# Patient Record
Sex: Female | Born: 1975
Health system: Southern US, Community
[De-identification: ages and names within clinical notes are randomized; demographics above are authoritative.]

## PROBLEM LIST (undated history)

## (undated) DIAGNOSIS — R1310 Dysphagia, unspecified: Secondary | ICD-10-CM

## (undated) DIAGNOSIS — M549 Dorsalgia, unspecified: Secondary | ICD-10-CM

## (undated) DIAGNOSIS — K760 Fatty (change of) liver, not elsewhere classified: Secondary | ICD-10-CM

## (undated) DIAGNOSIS — D8989 Other specified disorders involving the immune mechanism, not elsewhere classified: Secondary | ICD-10-CM

## (undated) DIAGNOSIS — E079 Disorder of thyroid, unspecified: Secondary | ICD-10-CM

## (undated) DIAGNOSIS — D509 Iron deficiency anemia, unspecified: Secondary | ICD-10-CM

## (undated) DIAGNOSIS — Q625 Duplication of ureter: Secondary | ICD-10-CM

## (undated) DIAGNOSIS — K219 Gastro-esophageal reflux disease without esophagitis: Secondary | ICD-10-CM

## (undated) DIAGNOSIS — M2550 Pain in unspecified joint: Secondary | ICD-10-CM

## (undated) DIAGNOSIS — D649 Anemia, unspecified: Secondary | ICD-10-CM

## (undated) HISTORY — DX: Other specified disorders involving the immune mechanism, not elsewhere classified: D89.89

## (undated) HISTORY — PX: FOOT FRACTURE SURGERY: SHX645

## (undated) HISTORY — DX: Iron deficiency anemia, unspecified: D50.9

## (undated) HISTORY — DX: Disorder of thyroid, unspecified: E07.9

## (undated) HISTORY — DX: Duplication of ureter: Q62.5

## (undated) HISTORY — DX: Fatty (change of) liver, not elsewhere classified: K76.0

## (undated) HISTORY — PX: EYE SURGERY: SHX253

## (undated) HISTORY — PX: BREAST SURGERY: SHX581

## (undated) HISTORY — PX: OTHER SURGICAL HISTORY: SHX169

## (undated) HISTORY — DX: Dysphagia, unspecified: R13.10

## (undated) HISTORY — DX: Pain in unspecified joint: M25.50

## (undated) HISTORY — PX: ESOPHAGEAL DILATION: SHX303

## (undated) HISTORY — DX: Gastro-esophageal reflux disease without esophagitis: K21.9

## (undated) HISTORY — DX: Dorsalgia, unspecified: M54.9

---

## 1998-09-14 ENCOUNTER — Ambulatory Visit: Admission: RE | Admit: 1998-09-14 | Discharge: 1998-09-14 | Payer: Self-pay | Admitting: Family Medicine

## 1999-08-16 ENCOUNTER — Encounter: Payer: Self-pay | Admitting: Surgery

## 1999-08-16 ENCOUNTER — Emergency Department (HOSPITAL_COMMUNITY): Admission: EM | Admit: 1999-08-16 | Discharge: 1999-08-16 | Payer: Self-pay

## 1999-08-16 ENCOUNTER — Encounter: Payer: Self-pay | Admitting: Emergency Medicine

## 1999-09-06 ENCOUNTER — Ambulatory Visit (HOSPITAL_COMMUNITY): Admission: RE | Admit: 1999-09-06 | Discharge: 1999-09-06 | Payer: Self-pay | Admitting: Orthopedic Surgery

## 1999-09-06 ENCOUNTER — Encounter: Payer: Self-pay | Admitting: Orthopedic Surgery

## 1999-09-15 ENCOUNTER — Ambulatory Visit (HOSPITAL_BASED_OUTPATIENT_CLINIC_OR_DEPARTMENT_OTHER): Admission: RE | Admit: 1999-09-15 | Discharge: 1999-09-15 | Payer: Self-pay | Admitting: Orthopedic Surgery

## 1999-11-10 ENCOUNTER — Ambulatory Visit: Admission: RE | Admit: 1999-11-10 | Discharge: 1999-11-10 | Payer: Self-pay | Admitting: Radiation Oncology

## 1999-11-28 ENCOUNTER — Other Ambulatory Visit: Admission: RE | Admit: 1999-11-28 | Discharge: 1999-11-28 | Payer: Self-pay | Admitting: Obstetrics & Gynecology

## 2000-12-10 ENCOUNTER — Ambulatory Visit (HOSPITAL_COMMUNITY): Admission: RE | Admit: 2000-12-10 | Discharge: 2000-12-10 | Payer: Self-pay | Admitting: Obstetrics & Gynecology

## 2000-12-17 ENCOUNTER — Other Ambulatory Visit: Admission: RE | Admit: 2000-12-17 | Discharge: 2000-12-17 | Payer: Self-pay | Admitting: Obstetrics & Gynecology

## 2001-02-03 ENCOUNTER — Ambulatory Visit: Admission: AD | Admit: 2001-02-03 | Discharge: 2001-02-03 | Payer: Self-pay | Admitting: Obstetrics & Gynecology

## 2001-02-03 ENCOUNTER — Encounter (INDEPENDENT_AMBULATORY_CARE_PROVIDER_SITE_OTHER): Payer: Self-pay | Admitting: Specialist

## 2001-02-03 ENCOUNTER — Encounter: Payer: Self-pay | Admitting: Obstetrics & Gynecology

## 2001-02-05 ENCOUNTER — Inpatient Hospital Stay (HOSPITAL_COMMUNITY): Admission: AD | Admit: 2001-02-05 | Discharge: 2001-02-05 | Payer: Self-pay | Admitting: Obstetrics and Gynecology

## 2001-04-02 ENCOUNTER — Ambulatory Visit (HOSPITAL_COMMUNITY): Admission: RE | Admit: 2001-04-02 | Discharge: 2001-04-02 | Payer: Self-pay | Admitting: *Deleted

## 2001-04-05 ENCOUNTER — Emergency Department (HOSPITAL_COMMUNITY): Admission: EM | Admit: 2001-04-05 | Discharge: 2001-04-05 | Payer: Self-pay | Admitting: Emergency Medicine

## 2001-12-31 HISTORY — PX: OTHER SURGICAL HISTORY: SHX169

## 2002-04-16 ENCOUNTER — Inpatient Hospital Stay (HOSPITAL_COMMUNITY): Admission: AD | Admit: 2002-04-16 | Discharge: 2002-04-16 | Payer: Self-pay | Admitting: Obstetrics & Gynecology

## 2002-04-21 ENCOUNTER — Inpatient Hospital Stay (HOSPITAL_COMMUNITY): Admission: AD | Admit: 2002-04-21 | Discharge: 2002-04-24 | Payer: Self-pay | Admitting: Obstetrics and Gynecology

## 2002-06-01 ENCOUNTER — Other Ambulatory Visit: Admission: RE | Admit: 2002-06-01 | Discharge: 2002-06-01 | Payer: Self-pay | Admitting: Obstetrics and Gynecology

## 2002-06-11 ENCOUNTER — Ambulatory Visit (HOSPITAL_COMMUNITY): Admission: RE | Admit: 2002-06-11 | Discharge: 2002-06-11 | Payer: Self-pay | Admitting: Obstetrics and Gynecology

## 2002-06-11 ENCOUNTER — Encounter (INDEPENDENT_AMBULATORY_CARE_PROVIDER_SITE_OTHER): Payer: Self-pay

## 2002-12-31 HISTORY — PX: AUGMENTATION MAMMAPLASTY: SUR837

## 2003-05-26 ENCOUNTER — Other Ambulatory Visit: Admission: RE | Admit: 2003-05-26 | Discharge: 2003-05-26 | Payer: Self-pay | Admitting: Obstetrics and Gynecology

## 2004-04-02 ENCOUNTER — Emergency Department (HOSPITAL_COMMUNITY): Admission: EM | Admit: 2004-04-02 | Discharge: 2004-04-02 | Payer: Self-pay | Admitting: Emergency Medicine

## 2004-07-27 ENCOUNTER — Other Ambulatory Visit: Admission: RE | Admit: 2004-07-27 | Discharge: 2004-07-27 | Payer: Self-pay | Admitting: Obstetrics and Gynecology

## 2004-09-27 ENCOUNTER — Ambulatory Visit: Admission: RE | Admit: 2004-09-27 | Discharge: 2004-09-27 | Payer: Self-pay | Admitting: Radiation Oncology

## 2005-02-22 ENCOUNTER — Ambulatory Visit (HOSPITAL_COMMUNITY): Admission: RE | Admit: 2005-02-22 | Discharge: 2005-02-22 | Payer: Self-pay | Admitting: Gastroenterology

## 2005-02-22 ENCOUNTER — Encounter (INDEPENDENT_AMBULATORY_CARE_PROVIDER_SITE_OTHER): Payer: Self-pay | Admitting: Specialist

## 2005-05-11 ENCOUNTER — Encounter (INDEPENDENT_AMBULATORY_CARE_PROVIDER_SITE_OTHER): Payer: Self-pay | Admitting: *Deleted

## 2005-05-11 ENCOUNTER — Ambulatory Visit (HOSPITAL_COMMUNITY): Admission: RE | Admit: 2005-05-11 | Discharge: 2005-05-11 | Payer: Self-pay | Admitting: Gastroenterology

## 2005-09-14 ENCOUNTER — Other Ambulatory Visit: Admission: RE | Admit: 2005-09-14 | Discharge: 2005-09-14 | Payer: Self-pay | Admitting: Obstetrics and Gynecology

## 2006-05-13 ENCOUNTER — Ambulatory Visit (HOSPITAL_COMMUNITY): Admission: RE | Admit: 2006-05-13 | Discharge: 2006-05-13 | Payer: Self-pay | Admitting: Orthopedic Surgery

## 2006-10-02 ENCOUNTER — Ambulatory Visit: Payer: Self-pay | Admitting: Hematology & Oncology

## 2006-10-04 LAB — CBC & DIFF AND RETIC
Basophils Absolute: 0 10*3/uL (ref 0.0–0.1)
EOS%: 2.5 % (ref 0.0–7.0)
HCT: 28.4 % — ABNORMAL LOW (ref 34.8–46.6)
HGB: 9.1 g/dL — ABNORMAL LOW (ref 11.6–15.9)
IRF: 0.24 (ref 0.130–0.330)
LYMPH%: 29.7 % (ref 14.0–48.0)
MCH: 22 pg — ABNORMAL LOW (ref 26.0–34.0)
MCV: 68.8 fL — ABNORMAL LOW (ref 81.0–101.0)
NEUT%: 59.2 % (ref 39.6–76.8)
Platelets: 229 10*3/uL (ref 145–400)
lymph#: 1.9 10*3/uL (ref 0.9–3.3)

## 2006-10-04 LAB — FERRITIN: Ferritin: 1 ng/mL — ABNORMAL LOW (ref 10–291)

## 2006-10-04 LAB — CHCC SMEAR

## 2006-10-23 ENCOUNTER — Ambulatory Visit: Payer: Self-pay | Admitting: Internal Medicine

## 2006-11-28 ENCOUNTER — Ambulatory Visit: Payer: Self-pay | Admitting: Hematology & Oncology

## 2006-12-02 LAB — CBC & DIFF AND RETIC
BASO%: 0.2 % (ref 0.0–2.0)
EOS%: 4.5 % (ref 0.0–7.0)
IRF: 0.17 (ref 0.130–0.330)
MCH: 26.8 pg (ref 26.0–34.0)
MCHC: 33.5 g/dL (ref 32.0–36.0)
MONO#: 0.4 10*3/uL (ref 0.1–0.9)
RDW: 24.4 % — ABNORMAL HIGH (ref 11.3–14.5)
RETIC #: 126.6 10*3/uL — ABNORMAL HIGH (ref 19.7–115.1)
Retic %: 2.8 % — ABNORMAL HIGH (ref 0.4–2.3)
WBC: 7.3 10*3/uL (ref 3.9–10.0)
lymph#: 1.5 10*3/uL (ref 0.9–3.3)

## 2006-12-04 LAB — TSH: TSH: 2.366 u[IU]/mL (ref 0.350–5.500)

## 2006-12-11 ENCOUNTER — Ambulatory Visit (HOSPITAL_COMMUNITY): Admission: RE | Admit: 2006-12-11 | Discharge: 2006-12-11 | Payer: Self-pay | Admitting: Gastroenterology

## 2006-12-11 ENCOUNTER — Encounter (INDEPENDENT_AMBULATORY_CARE_PROVIDER_SITE_OTHER): Payer: Self-pay | Admitting: Specialist

## 2007-01-09 ENCOUNTER — Ambulatory Visit: Payer: Self-pay | Admitting: Hematology & Oncology

## 2007-01-13 LAB — CBC & DIFF AND RETIC
BASO%: 0.3 % (ref 0.0–2.0)
EOS%: 2.8 % (ref 0.0–7.0)
MCH: 28.7 pg (ref 26.0–34.0)
MCHC: 34 g/dL (ref 32.0–36.0)
MCV: 84.2 fL (ref 81.0–101.0)
MONO%: 4.2 % (ref 0.0–13.0)
RBC: 4.21 10*6/uL (ref 3.70–5.32)
RDW: 13.9 % (ref 11.3–14.5)
RETIC #: 54.7 10*3/uL (ref 19.7–115.1)
Retic %: 1.3 % (ref 0.4–2.3)

## 2007-02-20 ENCOUNTER — Ambulatory Visit: Payer: Self-pay | Admitting: Hematology & Oncology

## 2007-02-24 LAB — CBC WITH DIFFERENTIAL/PLATELET
Basophils Absolute: 0 10*3/uL (ref 0.0–0.1)
EOS%: 3.7 % (ref 0.0–7.0)
HCT: 34.5 % — ABNORMAL LOW (ref 34.8–46.6)
HGB: 11.8 g/dL (ref 11.6–15.9)
MCH: 29.3 pg (ref 26.0–34.0)
MCV: 85.9 fL (ref 81.0–101.0)
MONO%: 5.5 % (ref 0.0–13.0)
NEUT%: 63.2 % (ref 39.6–76.8)
Platelets: 202 10*3/uL (ref 145–400)

## 2007-03-03 ENCOUNTER — Emergency Department (HOSPITAL_COMMUNITY): Admission: EM | Admit: 2007-03-03 | Discharge: 2007-03-03 | Payer: Self-pay | Admitting: Family Medicine

## 2007-05-28 ENCOUNTER — Ambulatory Visit: Payer: Self-pay | Admitting: Hematology & Oncology

## 2007-05-30 LAB — CBC & DIFF AND RETIC
Basophils Absolute: 0 10*3/uL (ref 0.0–0.1)
EOS%: 5 % (ref 0.0–7.0)
Eosinophils Absolute: 0.3 10*3/uL (ref 0.0–0.5)
HCT: 35.1 % (ref 34.8–46.6)
HGB: 12.3 g/dL (ref 11.6–15.9)
IRF: 0.23 (ref 0.130–0.330)
MCH: 29.9 pg (ref 26.0–34.0)
MCV: 85.4 fL (ref 81.0–101.0)
MONO%: 7.5 % (ref 0.0–13.0)
NEUT#: 4 10*3/uL (ref 1.5–6.5)
NEUT%: 62.7 % (ref 39.6–76.8)
lymph#: 1.6 10*3/uL (ref 0.9–3.3)

## 2007-05-30 LAB — FERRITIN: Ferritin: 65 ng/mL (ref 10–291)

## 2007-06-17 ENCOUNTER — Encounter (INDEPENDENT_AMBULATORY_CARE_PROVIDER_SITE_OTHER): Payer: Self-pay | Admitting: Gastroenterology

## 2007-06-17 ENCOUNTER — Ambulatory Visit (HOSPITAL_COMMUNITY): Admission: RE | Admit: 2007-06-17 | Discharge: 2007-06-17 | Payer: Self-pay | Admitting: Gastroenterology

## 2007-07-28 ENCOUNTER — Encounter: Admission: RE | Admit: 2007-07-28 | Discharge: 2007-09-08 | Payer: Self-pay | Admitting: Orthopaedic Surgery

## 2007-08-27 ENCOUNTER — Ambulatory Visit: Payer: Self-pay | Admitting: Hematology & Oncology

## 2007-09-29 LAB — CBC WITH DIFFERENTIAL/PLATELET
BASO%: 0.8 % (ref 0.0–2.0)
Basophils Absolute: 0.1 10*3/uL (ref 0.0–0.1)
EOS%: 3.2 % (ref 0.0–7.0)
Eosinophils Absolute: 0.2 10*3/uL (ref 0.0–0.5)
HCT: 35.8 % (ref 34.8–46.6)
HGB: 12.5 g/dL (ref 11.6–15.9)
LYMPH%: 24.2 % (ref 14.0–48.0)
MCH: 30.3 pg (ref 26.0–34.0)
MCHC: 34.9 g/dL (ref 32.0–36.0)
MCV: 86.6 fL (ref 81.0–101.0)
MONO#: 0.4 10*3/uL (ref 0.1–0.9)
MONO%: 5.5 % (ref 0.0–13.0)
NEUT#: 5 10*3/uL (ref 1.5–6.5)
NEUT%: 66.3 % (ref 39.6–76.8)
Platelets: 209 10*3/uL (ref 145–400)
RBC: 4.13 10*6/uL (ref 3.70–5.32)
RDW: 12.3 % (ref 11.3–14.5)
WBC: 7.6 10*3/uL (ref 3.9–10.0)
lymph#: 1.8 10*3/uL (ref 0.9–3.3)

## 2007-09-29 LAB — CHCC SMEAR

## 2007-09-29 LAB — FERRITIN: Ferritin: 77 ng/mL (ref 10–291)

## 2007-10-01 LAB — CONVERTED CEMR LAB

## 2008-01-22 ENCOUNTER — Ambulatory Visit: Payer: Self-pay | Admitting: Hematology & Oncology

## 2008-01-26 LAB — CBC & DIFF AND RETIC
Basophils Absolute: 0 10*3/uL (ref 0.0–0.1)
EOS%: 3.8 % (ref 0.0–7.0)
Eosinophils Absolute: 0.3 10*3/uL (ref 0.0–0.5)
LYMPH%: 26.7 % (ref 14.0–48.0)
MCH: 30.3 pg (ref 26.0–34.0)
MCV: 87.1 fL (ref 81.0–101.0)
MONO%: 6 % (ref 0.0–13.0)
Platelets: 220 10*3/uL (ref 145–400)
RBC: 4.13 10*6/uL (ref 3.70–5.32)
RDW: 11.8 % (ref 11.3–14.5)
RETIC #: 43.4 10*3/uL (ref 19.7–115.1)

## 2008-01-26 LAB — CHCC SMEAR

## 2008-02-28 ENCOUNTER — Emergency Department (HOSPITAL_COMMUNITY): Admission: EM | Admit: 2008-02-28 | Discharge: 2008-02-28 | Payer: Self-pay | Admitting: Family Medicine

## 2008-06-30 ENCOUNTER — Ambulatory Visit: Payer: Self-pay | Admitting: Internal Medicine

## 2008-06-30 LAB — CONVERTED CEMR LAB
ALT: 13 units/L (ref 0–35)
AST: 17 units/L (ref 0–37)
Albumin: 3.6 g/dL (ref 3.5–5.2)
BUN: 8 mg/dL (ref 6–23)
Basophils Absolute: 0.1 10*3/uL (ref 0.0–0.1)
Basophils Relative: 0.9 % (ref 0.0–1.0)
Calcium: 8.9 mg/dL (ref 8.4–10.5)
Chloride: 109 meq/L (ref 96–112)
Cholesterol: 138 mg/dL (ref 0–200)
Creatinine, Ser: 0.6 mg/dL (ref 0.4–1.2)
Eosinophils Absolute: 0.3 10*3/uL (ref 0.0–0.7)
Eosinophils Relative: 4.2 % (ref 0.0–5.0)
GFR calc non Af Amer: 123 mL/min
HCT: 35.9 % — ABNORMAL LOW (ref 36.0–46.0)
Hemoglobin: 12.1 g/dL (ref 12.0–15.0)
Ketones, ur: NEGATIVE mg/dL
LDL Cholesterol: 86 mg/dL (ref 0–99)
MCHC: 33.8 g/dL (ref 30.0–36.0)
MCV: 89.4 fL (ref 78.0–100.0)
Neutro Abs: 3.9 10*3/uL (ref 1.4–7.7)
Neutrophils Relative %: 60.3 % (ref 43.0–77.0)
RBC: 4.02 M/uL (ref 3.87–5.11)
Specific Gravity, Urine: 1.015 (ref 1.000–1.03)
TSH: 3.17 microintl units/mL (ref 0.35–5.50)
Total Bilirubin: 0.7 mg/dL (ref 0.3–1.2)
Total Protein, Urine: NEGATIVE mg/dL
Triglycerides: 63 mg/dL (ref 0–149)
Urine Glucose: NEGATIVE mg/dL
Urobilinogen, UA: 0.2 (ref 0.0–1.0)
WBC: 6.6 10*3/uL (ref 4.5–10.5)
pH: 8 (ref 5.0–8.0)

## 2008-07-07 ENCOUNTER — Ambulatory Visit: Payer: Self-pay | Admitting: Internal Medicine

## 2008-07-07 DIAGNOSIS — IMO0001 Reserved for inherently not codable concepts without codable children: Secondary | ICD-10-CM | POA: Insufficient documentation

## 2008-07-07 DIAGNOSIS — K219 Gastro-esophageal reflux disease without esophagitis: Secondary | ICD-10-CM | POA: Insufficient documentation

## 2008-07-07 DIAGNOSIS — K589 Irritable bowel syndrome without diarrhea: Secondary | ICD-10-CM | POA: Insufficient documentation

## 2008-07-07 DIAGNOSIS — E663 Overweight: Secondary | ICD-10-CM | POA: Insufficient documentation

## 2008-07-07 DIAGNOSIS — D649 Anemia, unspecified: Secondary | ICD-10-CM | POA: Insufficient documentation

## 2008-07-07 DIAGNOSIS — R404 Transient alteration of awareness: Secondary | ICD-10-CM | POA: Insufficient documentation

## 2008-07-07 DIAGNOSIS — M549 Dorsalgia, unspecified: Secondary | ICD-10-CM | POA: Insufficient documentation

## 2008-07-07 DIAGNOSIS — K279 Peptic ulcer, site unspecified, unspecified as acute or chronic, without hemorrhage or perforation: Secondary | ICD-10-CM | POA: Insufficient documentation

## 2008-07-07 DIAGNOSIS — D509 Iron deficiency anemia, unspecified: Secondary | ICD-10-CM | POA: Insufficient documentation

## 2008-07-07 DIAGNOSIS — Z8711 Personal history of peptic ulcer disease: Secondary | ICD-10-CM | POA: Insufficient documentation

## 2008-07-07 LAB — CONVERTED CEMR LAB: Rhuematoid fact SerPl-aCnc: <20 intl units/mL — ABNORMAL LOW (ref 0.0–20.0)

## 2008-07-10 ENCOUNTER — Encounter: Payer: Self-pay | Admitting: Internal Medicine

## 2009-06-13 ENCOUNTER — Ambulatory Visit (HOSPITAL_COMMUNITY): Admission: RE | Admit: 2009-06-13 | Discharge: 2009-06-13 | Payer: Self-pay | Admitting: Obstetrics and Gynecology

## 2009-07-15 ENCOUNTER — Ambulatory Visit: Payer: Self-pay | Admitting: Hematology & Oncology

## 2009-12-14 ENCOUNTER — Emergency Department (HOSPITAL_COMMUNITY): Admission: EM | Admit: 2009-12-14 | Discharge: 2009-12-14 | Payer: Self-pay | Admitting: Emergency Medicine

## 2010-08-15 ENCOUNTER — Emergency Department (HOSPITAL_COMMUNITY): Admission: EM | Admit: 2010-08-15 | Discharge: 2010-08-15 | Payer: Self-pay | Admitting: Emergency Medicine

## 2010-09-12 ENCOUNTER — Ambulatory Visit (HOSPITAL_COMMUNITY): Admission: RE | Admit: 2010-09-12 | Discharge: 2010-09-12 | Payer: Self-pay | Admitting: Gastroenterology

## 2010-12-31 DIAGNOSIS — Q625 Duplication of ureter: Secondary | ICD-10-CM

## 2010-12-31 HISTORY — DX: Duplication of ureter: Q62.5

## 2011-03-16 LAB — BASIC METABOLIC PANEL
BUN: 8 mg/dL (ref 6–23)
CO2: 26 mEq/L (ref 19–32)
Calcium: 9 mg/dL (ref 8.4–10.5)
Chloride: 107 mEq/L (ref 96–112)
Creatinine, Ser: 0.59 mg/dL (ref 0.4–1.2)
GFR calc Af Amer: >60 mL/min (ref >60)

## 2011-03-16 LAB — CBC
MCH: 28.3 pg (ref 26.0–34.0)
MCV: 87.3 fL (ref 78.0–100.0)
Platelets: 187 10*3/uL (ref 150–400)
RDW: 12.2 % (ref 11.5–15.5)

## 2011-03-16 LAB — DIFFERENTIAL
Basophils Absolute: 0 10*3/uL (ref 0.0–0.1)
Basophils Relative: 0 % (ref 0–1)
Eosinophils Absolute: 0.1 10*3/uL (ref 0.0–0.7)
Eosinophils Relative: 1 % (ref 0–5)
Neutrophils Relative %: 71 % (ref 43–77)

## 2011-04-09 LAB — T4, FREE: Free T4: 0.75 ng/dL — ABNORMAL LOW (ref 0.80–1.80)

## 2011-04-09 LAB — TSH: TSH: 6.98 u[IU]/mL — ABNORMAL HIGH (ref 0.350–4.500)

## 2011-05-09 ENCOUNTER — Other Ambulatory Visit: Payer: Self-pay | Admitting: Obstetrics and Gynecology

## 2011-05-09 DIAGNOSIS — R599 Enlarged lymph nodes, unspecified: Secondary | ICD-10-CM

## 2011-05-11 ENCOUNTER — Ambulatory Visit
Admission: RE | Admit: 2011-05-11 | Discharge: 2011-05-11 | Disposition: A | Discharge status: Home / Self Care | Payer: 59 | Source: Ambulatory Visit | Attending: Obstetrics and Gynecology | Admitting: Obstetrics and Gynecology

## 2011-05-11 DIAGNOSIS — R599 Enlarged lymph nodes, unspecified: Secondary | ICD-10-CM

## 2011-05-11 MED ORDER — IOHEXOL 300 MG/ML  SOLN
100.0000 mL | Freq: Once | INTRAMUSCULAR | Status: AC | PRN
  Administered 2011-05-11: 100 mL via INTRAVENOUS

## 2011-05-15 NOTE — Op Note (Signed)
[name], Kayla Salazar                ACCOUNT NO.:  [id number]   MEDICAL RECORD NO.:  [id number]          PATIENT TYPE:  AMB   LOCATION:  ENDO                         FACILITY:  [facility]   PHYSICIAN:  Petra Kuba, M.D.    DATE OF BIRTH:  1976/01/25   DATE OF PROCEDURE:  06/17/2007  DATE OF DISCHARGE:                               OPERATIVE REPORT   PROCEDURE:  EGD with biopsy.   ENDOSCOPIST:  Petra Kuba, MD.   INDICATION:  Patient with difficult to heal recurring ulcers, want to  reevaluate, consent was signed after risks, benefits, methods, and  options thoroughly discussed in the office.   MEDICINES USED:  1. Fentanyl 75 mcg.  2. Versed 7 mg.   PROCEDURE:  The video endoscope was inserted by direct vision.  The  esophagus was normal, possibly she had a tiny hiatal hernia.  The scope  passed into the stomach and advanced to the antrum.  The antrum looked  much better, but there was still a small, shallow ulcer as well as a few  erosions and some inflammation, all of this confined to the antrum.  The  scope passed through a normal pylorus into a normal duodenal bulb and  around the C-loop to the second portion of the duodenum.  There was a  minimal amount of erythema in the second portion of the duodenum.  The  scope was withdrawn back to the bulb and I could not rule out  abnormalities in that location.  The scope was withdrawn back to the  stomach and retroflexed.  A patent pylorus, cardia, fundus, lesser and  greater curve were normal on retroflexed visualization.  Straight  visualization in the stomach did not reveal any additional findings.  The scope was then advanced to the antrum, and multiple biopsies of the  ulcer and the erosion and the inflammation were obtained.  We also took  two proximal stomach biopsies as well to rule out Helicobacter.  Air was  suctioned, the scope was slowly withdrawn again, and a good look at the  esophagus was normal.  The scope was  withdrawn.  Patient tolerated the  procedure well, and there was no obvious, immediate complication.   ENDOSCOPIC DIAGNOSES:  1. A questionable tiny hiatal hernia.  2. Improved antral ulcerations, but with one residual shallow ulcer, a      few erosions, and some antritis, status post biopsy.  3. Minimal second portion of the duodenum inflammation.  4. Otherwise normal esophagogastroduodenoscopy, status post proximal      stomach biopsies to rule out Helicobacter.   PLAN:  Will await pathology, probably need to be ID pump inhibitors or  at least double dosed, no aspirin or nonsteroidals will reiterate, and  follow up p.r.n. or in two or three months to recheck symptoms to make  sure no further workup plans are needed.           ______________________________  Petra Kuba, M.D.     MEM/MEDQ  D:  06/17/2007  T:  06/17/2007  Job:  [id number]

## 2011-05-18 NOTE — Op Note (Signed)
NAMEGALIT, URICH                ACCOUNT NO.:  [id number]   MEDICAL RECORD NO.:  [id number]          PATIENT TYPE:  AMB   LOCATION:  ENDO                         FACILITY:  [facility]   PHYSICIAN:  Petra Kuba, M.D.    DATE OF BIRTH:  July 20, 1976   DATE OF PROCEDURE:  05/11/2005  DATE OF DISCHARGE:                                 OPERATIVE REPORT   PROCEDURE:  Esophagogastroduodenoscopy with biopsy.   INDICATIONS FOR PROCEDURE:  Patient with a history of gastric ulcers and  want to repeat to document healing.  Consent was signed after risks,  benefits, methods, and options were thoroughly discussed in the office.   MEDICATIONS USED:  Demerol 50, Versed 7.5.   PROCEDURE:  The video endoscope was inserted by direct vision.  The  esophagus was normal.  The scope was passed into the stomach and advanced to  the antrum where one partially healed ulcer was seen and a few erosions were  seen.  The other ulcers seemed to be fairly well healed but there was some  erythema.  Multiple biopsies were obtained of all these areas at the end of  the procedure.  The pylorus was normal as was the duodenal bulb, C-loop, and  the second portion of the duodenum.  The scope was slowly withdrawn back to  the bulb and, again, a good look there confirmed its normal appearance.  The  scope was withdrawn back to the stomach and retroflexed.  Angularis, cardia,  fundus, lesser and greater curve were normal on retroflex visualization.  Straight visualization of the stomach confirmed the normal appearance except  for the antrum.  The biopsies were obtained at this juncture.  Air was  suctioned.  The scope was slowly withdrawn.  Again, a good look at the  esophagus was normal.  The scope was removed.  The patient tolerated the  procedure well.  There was no obvious immediate complications.   ENDOSCOPIC DIAGNOSIS:  1.  Partially healed antral ulcers with residual erosions and erythema, all      in the antrum,  status post biopsy.  2.  Otherwise, normal EGD, not mentioned above were two biopsies of the      proximal stomach, as well, to rule out Helicobacter.   PLAN:  Await pathology, possibly change pump inhibitors or double the  Nexium, will go ahead and get a fasting gastrin today and follow up p.r.n.  or in two months, and will touch base with her about the biopsies to decide  any other workup and plan at that junction.      MEM/MEDQ  D:  05/11/2005  T:  05/11/2005  Job:  [id number]

## 2011-05-18 NOTE — Op Note (Signed)
NAMEAKEMI, OVERHOLSER                ACCOUNT NO.:  [id number]   MEDICAL RECORD NO.:  [id number]          PATIENT TYPE:  AMB   LOCATION:  ENDO                         FACILITY:  [facility]   PHYSICIAN:  Petra Kuba, M.D.    DATE OF BIRTH:  [birth year]   DATE OF PROCEDURE:  02/22/2005  DATE OF DISCHARGE:                                 OPERATIVE REPORT   PROCEDURE:  EGD with biopsy.   INDICATION:  Abdominal pain.  Want to rule out ulcer.  Consent was signed  after risks, benefits, methods, options thoroughly discussed in the office.   MEDICINES USED:  1.  Fentanyl 50 mcg.  2.  Versed 6.   DESCRIPTION OF PROCEDURE:  The video endoscope was inserted by direct  vision.  The esophagus was normal.  The scope passed into the stomach,  advanced to the antrum where two moderately large antral ulcers were seen.  There was no obvious mass associated with them.  The scope passed through a  normal pylorus, into a normal duodenal bulb, and around the C-loop to a  normal second portion of the duodenum.  The scope was withdrawn back to the  bulb, and a good look there ruled out ulcers in that location.  The scope  was withdrawn back to the stomach and retroflexed.  Angularis was normal.  Cardia, fundus, lesser and greater curve were all normal on retroflexed  visualization.  Straight visualization did show some small antral erosions  but no other abnormalities but the ulcers.  Went ahead and took one biopsy  of the antrum for the CLOtest and a few biopsies of both ulcers and put them  in the same path container.  Air was suctioned and scope was slowly  withdrawn.  Again, a good look at the esophagus was normal.  The scope was  removed.  The patient tolerated the procedure well.  There were no obvious  immediate complications.   ENDOSCOPIC DIAGNOSES:  1.  Two antral ulcers, status post biopsy and CLOtest biopsy.  2.  Otherwise, normal EGD.   PLAN:  1.  Nexium 40 mg b.i.d. for 2 weeks and then  daily.  2.  Follow up in 1 month or p.r.n.  3.  No aspirin or nonsteroidals.  4.  Await pathology but consider even getting a fasting gastrin level.  Will      not do it today based on multiple sticks for her IV and be happy to see      back sooner p.r.n.  Otherwise, follow up as above.      MEM/MEDQ  D:  02/22/2005  T:  02/22/2005  Job:  [id number]

## 2011-05-18 NOTE — Op Note (Signed)
NAMEMEGAN, Kayla Salazar                ACCOUNT NO.:  [id number]   MEDICAL RECORD NO.:  [id number]          PATIENT TYPE:  AMB   LOCATION:  ENDO                         FACILITY:  [facility]   PHYSICIAN:  Petra Kuba, M.D.    DATE OF BIRTH:  1976-11-15   DATE OF PROCEDURE:  12/11/2006  DATE OF DISCHARGE:                               OPERATIVE REPORT   PROCEDURE:  Colonoscopy.   INDICATIONS:  Iron-deficiency, left lower quadrant pain.   Consent was signed after __________ options thoroughly discussed the  office on multiple occasions.   MEDICINES USED:  Fentanyl 100 mcg, Versed 10 mg.   PROCEDURE:  Rectal inspection is pertinent for external hemorrhoids  small, digital exam was negative.  Video pediatric adjustable  colonoscope was inserted and with abdominal pressure, easily able to be  advanced around the colon to the cecum.  There were flecks of dark  material with __________ suctioned.  These were guaiaced and turned out  to be guaiac positive but no signs of active bleeding. Cecum was  identified by the appendiceal orifice and ileocecal valve.  Scope was  inserted short ways into the terminal ileum which was normal.  Similar  colored debris was seen in the terminal ileum.  It was only flecks  periodically not significant.  Scope was slowly withdrawn.  The prep was  adequate.  On slow withdrawal back to the rectum, the prep was adequate.  There was minimal washing and suctioning needed but no abnormalities  were seen.  Specifically no signs of bleeding polyps, tumors, masses or  diverticula.  Once back in the rectum anorectal pull-through and  retroflexion confirmed some small hemorrhoids.  Scope was straightened  readvanced short ways up left side of colon.  Air was suctioned, scope  removed.  The patient tolerated the procedure well.  There was no  obvious immediate complication.   ENDOSCOPIC DIAGNOSES:  1. Internal-external hemorrhoids.  2. Otherwise within normal limits to the  terminal ileum but a few      flecks of guaiac positive substance in the colon.   PLAN:  Continue workup with an EGD.  Repeat colon screening at age 75.  Please see that other dictation for further workup and plans.           ______________________________  Petra Kuba, M.D.     MEM/MEDQ  D:  12/11/2006  T:  12/11/2006  Job:  [id number]   cc:   Randye Lobo, M.D.  Rose Phi. Myna Hidalgo, M.D.

## 2011-05-18 NOTE — Op Note (Signed)
[name], Kayla Salazar                ACCOUNT NO.:  [id number]   MEDICAL RECORD NO.:  [id number]          PATIENT TYPE:  AMB   LOCATION:  ENDO                         FACILITY:  [facility]   PHYSICIAN:  Petra Kuba, M.D.    DATE OF BIRTH:  1976-11-08   DATE OF PROCEDURE:  12/11/2006  DATE OF DISCHARGE:                               OPERATIVE REPORT   PROCEDURE:  Esophagogastroduodenoscopy with biopsy.   INDICATIONS:  Iron deficiency, nondiagnostic colonoscopy, history of  ulcers.  Consent was signed after risks, benefits, methods, options  thoroughly discussed in the office on multiple occasions.   ADDITIONAL MEDICINES:  2.5 mg of Versed only.   PROCEDURE:  The video endoscope was inserted by direct vision.  The  esophagus was normal.  Scope passed into the stomach and advanced to the  antrum, where one moderate-sized ulcer with a clot and two other smaller  ulcers with flat white bases and some erythema were seen in the antrum.  The scope passed through a normal pylorus into a normal duodenal bulb  and around the C-loop to a normal second portion of the duodenum.  A few  duodenal biopsies were obtained to rule out sprue.  The scope was  withdrawn back to the bulb and a good look there ruled out ulcers in  that location.  The scope was withdrawn back to the stomach and  retroflexed.  Angularis, cardia, fundus, lesser and greater curve were  normal on retroflex visualization.  Straight visualization confirmed the  antral ulcers.  The one with the clot was washed and watched.  Most of  the clots could be washed off.  There was one small one and residual but  could not be made to bleed.  Air and water were suctioned.  We went  ahead and biopsied the edge of three of the ulcers, took also two  biopsies of the antrum and two biopsies of the proximal stomach to rule  out Helicobacter.  Air was suctioned, scope slowly withdrawn again.  A  good look at the esophagus was normal.  Scope was  removed.  The patient  tolerated the procedure well.  There was no obvious immediate  complication.   ENDOSCOPIC DIAGNOSES:  1. Multiple antral ulcers, status post biopsy of the edge, one with      clot which could not be washed off.  2. Otherwise normal EGD, status post duodenal biopsies.   PLAN:  1. Await pathology.  2. Go ahead and get a CBC and a gastric well.  3. Resume pump inhibitors.  4. No aspirin or nonsteroidals long time.  5. Call me p.r.n., otherwise follow up in 1-2 months to recheck      symptoms and make sure no further workup plans are needed.      Probably repeat an endoscopy at some point to document healing      since these seem to be chronic.           ______________________________  Petra Kuba, M.D.     MEM/MEDQ  D:  12/11/2006  T:  12/11/2006  Job:  [id number]   cc:   Randye Lobo, M.D.  Rose Phi. Myna Hidalgo, M.D.

## 2011-05-18 NOTE — Op Note (Signed)
[facility]  Patient:    Kayla Salazar, Kayla Salazar                       MRN: [id number] Proc. Date: 02/03/01 Adm. Date:  [id number] Attending:  Lars Pinks                           Operative Report  PREOPERATIVE DIAGNOSIS:       Incomplete abortion.  POSTOPERATIVE DIAGNOSIS:      Incomplete abortion.  PROCEDURE:                    Dilatation and evacuation.  SURGEON:                      Richard D. Arlyce Dice, M.D.  ANESTHESIA:                   Spinal.  ESTIMATED BLOOD LOSS:         20 cc.  FINDINGS:                     Products of conception consistent with a spontaneous abortion of approximately [redacted] weeks gestation.  INDICATIONS:                  This is a 35 year old who was followed in the office with intrauterine pregnancy which had been confirmed with earlier ultrasound. The patient should be 11 weeks by dates. However, she had bleeding and spotting and ultrasound today showed a 9-week size fetal pole with no fetal cardiac activity. In light of the ultrasound findings, and the fact the patient was cramping and bleeding, the decision was made to proceed with dilatation and evacuation. Because the patient had eaten recently, the anesthesiologist felt that the safest anesthetic was spinal.  DESCRIPTION OF PROCEDURE:     The patient was taken to the operating room. A spinal anesthesia was placed. She was then placed in the dorsal lithotomy position and the vagina and peritoneum were prepped and draped in sterile fashion. The anterior lip of the cervix was grasped with a single-tooth tenaculum and the internal os was dilated with the Pratt dilators at 25. A #8 suction curet was introduced into the endometrial cavity and the products of conception were evacuated. The procedure was then terminated and the patient left the operating room in good condition. DD:  02/04/01 TD:  02/04/01 Job: [id number]

## 2011-05-18 NOTE — Op Note (Signed)
[facility]  Patient:    Kayla Salazar, Kayla Salazar Visit Number: [id number] MRN: [id number]          Service Type: DSU Location: [facility] Attending Physician:  Melony Overly Dictated by:   Devoria Albe Edward Jolly, M.D. Proc. Date: 06/11/02 Admit Date:  06/11/2002 Discharge Date: 06/11/2002                             Operative Report  PREOPERATIVE DIAGNOSES:       1. Vulvar vestibulitis.                               2. Dyspareunia.                               3. Request for intrauterine device placement.  POSTOPERATIVE DIAGNOSES:      1. Vulvar vestibulitis.                               2. Dyspareunia.                               3. Request for intrauterine device placement.  PROCEDURES;                   1. Partial vulvar vestibulectomy.                               2. Hymenectomy.                               3. Vaginal flap advancement.                               4. ParaGard intrauterine device placement.  SURGEON:                      Brook A. Edward Jolly, M.D.  ASSISTANT:                    Luvenia Redden, M.D.  ANESTHESIA:                   General endotracheal with local injection of 1% lidocaine.  IV FLUIDS:                    1300 cc Ringers lactate.  ESTIMATED BLOOD LOSS:         Minimal.  URINE OUTPUT:                 The patient was catheterized with an I&O catheter prior to the procedure.  INDICATIONS FOR PROCEDURE:    The patient is a 35 year old gravida 3, para 2-0-1-2 Caucasian female status post normal spontaneous vaginal delivery of a viable female infant on April 22, 2002.  She presented to her postpartum visit stating an eight-year history of vulvar vestibulitis, dyspareunia and a desire for surgical treatment.  The patient had previously been treated with antidepressant therapy.  Biopsies had been performed of the vestibule and the perineum, and these were all negative.  The patient had used multiple creams and emollients, which  did not alleviate her symptoms.  The patient specifically reported insertional dyspareunia and no constant burning syndrome of the vulva.  The patient also requested IUD placement, as she declined other forms of reversible contraception and is currently breast feeding.  The risks, benefits and alternatives were discussed with the patient, who wished to proceed with the procedure as noted above.  FINDINGS:                     Examination with the patient under anesthesia revealed a small anteverted uterus with no evidence of adnexal masses.  The cervix and vagina were noted to be normal.  Along the patients perineal body, she had a tightened band of tissue consistent with the site of her prior second-degree perineal laceration at the time of recent delivery.  The hymen itself appeared to be normal.  SPECIMENS:                    The posterior hymen was sent to pathology.  DESCRIPTION OF PROCEDURE:     With an IV in place, the patient was taken to the operating room after she was properly identified.  The patient received a dosage of cefazolin 1 g intravenous for antibiotic prophylaxis.  The patient received general endotracheal anesthesia.  She was then placed in the dorsal lithotomy position.  The vagina and perineum were then sterilely prepped with Hibiclens and draped in the usual fashion.  I&O catheterization of the bladder was performed.  Examination under anesthesia was performed with the findings as noted above. A speculum was placed inside the vagina.  A single-tooth tenaculum was placed on the anterior cervical lip.  The uterus was sounded and the ParaGard IUD was then placed appropriately and without complications.  The strings were trimmed and the speculum was removed.  Examination of the proposed surgical site was performed and outlined with a marking pen.  An incision was created with a scalpel on the vestibule just outside of Harts line.  The tissue was then undermined  using a scalpel to create a vaginal flap.  The incision was extended underneath the vagina such that the posterior aspect of the hymen was then sharply excised.  In toto, a horseshoe-shaped piece of tissue was removed from the posterior aspect of the hymen and vestibule.  This was sent to pathology.  The vaginal flap was further dissected free from the underlying superficial muscular attachments.  Hemostasis was achieved with a combination of monopolar cautery and interrupted sutures of 3-0 Vicryl, which were placed parallel to the long axis of the vagina.  This provided good hemostasis.  The vagina was then approximated to the vulva using vertical mattress sutures of 4-0 Vicryl. Hemostasis was noted to be excellent at the end of the procedure.  The perineum was then injected with lidocaine.  The patient was then taken out of the dorsal lithotomy position, awakened, extubated and escorted to the recovery room in stable and awake condition.  There were no complications to the procedure.  All sponge, needle and instrument counts were correct. Dictated by:   Devoria Albe Edward Jolly, M.D. Attending Physician:  Melony Overly DD:  06/11/02 TD:  06/13/02 Job: [id number]

## 2011-06-29 ENCOUNTER — Encounter (HOSPITAL_COMMUNITY): Payer: Self-pay

## 2011-06-29 ENCOUNTER — Encounter (HOSPITAL_COMMUNITY)
Admission: RE | Admit: 2011-06-29 | Discharge: 2011-06-29 | Disposition: A | Discharge status: Home / Self Care | Payer: 59 | Source: Ambulatory Visit | Attending: Obstetrics and Gynecology | Admitting: Obstetrics and Gynecology

## 2011-06-29 HISTORY — DX: Anemia, unspecified: D64.9

## 2011-06-29 LAB — BASIC METABOLIC PANEL
Calcium: 8.9 mg/dL (ref 8.4–10.5)
Chloride: 101 mEq/L (ref 96–112)
Creatinine, Ser: 0.55 mg/dL (ref 0.50–1.10)
GFR calc Af Amer: >60 mL/min (ref >60)
Sodium: 133 mEq/L — ABNORMAL LOW (ref 135–145)

## 2011-06-29 LAB — CBC
MCV: 88.2 fL (ref 78.0–100.0)
Platelets: 187 10*3/uL (ref 150–400)
RBC: 4.23 MIL/uL (ref 3.87–5.11)
RDW: 12.5 % (ref 11.5–15.5)
WBC: 6.2 10*3/uL (ref 4.0–10.5)

## 2011-06-29 LAB — SURGICAL PCR SCREEN
MRSA, PCR: NEGATIVE
Staphylococcus aureus: NEGATIVE

## 2011-06-29 LAB — URINALYSIS, ROUTINE W REFLEX MICROSCOPIC
Bilirubin Urine: NEGATIVE
Nitrite: NEGATIVE
Protein, ur: NEGATIVE mg/dL
Urobilinogen, UA: 0.2 mg/dL (ref 0.0–1.0)

## 2011-06-29 NOTE — Patient Instructions (Addendum)
20 CLARESSA HUGHLEY  06/29/2011   Your procedure is scheduled on:  07/10/11  Report to [facility] at 8:30 AM.  Call this number if you have problems the morning of surgery: [phone]   Remember:   Do not eat food:After Midnight.  Do not drink clear liquids: After Midnight.  Take these medicines the morning of surgery with A SIP OF WATER: None   Do not wear jewelry, make-up or nail polish.  Do not bring valuables to the hospital.  Contacts, dentures or bridgework may not be worn into surgery.  Leave suitcase in the car. After surgery it may be brought to your room.  For patients admitted to the hospital, checkout time is 11:00 AM the day of discharge.   Patients discharged the day of surgery will not be allowed to drive home.  Name and phone number of your driver: unknown    Special Instructions: CHG Shower Shower 2 days before surgery and 1 day before surgery with Hibiclens.   Please read over the following fact sheets that you were given: Pain Booklet

## 2011-07-09 MED ORDER — DEXTROSE 5 % IV SOLN
1.0000 g | Freq: Once | INTRAVENOUS | Status: DC
  Filled 2011-07-09: qty 1

## 2011-07-09 MED ORDER — DEXTROSE 5 % IV SOLN: 1.0000 g | Freq: Once | INTRAVENOUS | Status: DC

## 2011-07-09 NOTE — Anesthesia Preprocedure Evaluation (Addendum)
Anesthesia Evaluation  General Assessment Comment  Reviewed: Allergy & Precautions, H&P  and Patient's Chart, lab work & pertinent test results  Airway       Dental   Pulmonary      Cardiovascular    Neuro/Psych  GI/Hepatic/Renal   Endo/Other   Abdominal   Musculoskeletal  Hematology   Peds  Reproductive/Obstetrics          Anesthesia Physical Anesthesia Plan  ASA: II  Anesthesia Plan: General   Post-op Pain Management:    Induction:   Airway Management Planned:   Additional Equipment:   Intra-op Plan:   Post-operative Plan:   Informed Consent: I have reviewed the patients History and Physical, chart, labs and discussed the procedure including the risks, benefits and alternatives for the proposed anesthesia with the patient or authorized representative who has indicated his/her understanding and acceptance.   Dental Advisory Given  Plan Discussed with: Anesthesiologist and CRNA  Anesthesia Plan Comments:         Anesthesia Quick Evaluation

## 2011-07-10 ENCOUNTER — Encounter (HOSPITAL_COMMUNITY)
Admission: AD | Disposition: A | Discharge status: Home / Self Care | Payer: Self-pay | Source: Ambulatory Visit | Attending: Obstetrics and Gynecology

## 2011-07-10 ENCOUNTER — Other Ambulatory Visit: Payer: Self-pay

## 2011-07-10 ENCOUNTER — Ambulatory Visit (HOSPITAL_COMMUNITY): Payer: 59 | Admitting: Anesthesiology

## 2011-07-10 ENCOUNTER — Encounter: Payer: Self-pay | Admitting: Obstetrics and Gynecology

## 2011-07-10 ENCOUNTER — Encounter (HOSPITAL_COMMUNITY): Payer: Self-pay | Admitting: Anesthesiology

## 2011-07-10 ENCOUNTER — Ambulatory Visit (HOSPITAL_COMMUNITY)
Admission: AD | Admit: 2011-07-10 | Discharge: 2011-07-11 | Disposition: A | Discharge status: Home / Self Care | Payer: 59 | Source: Ambulatory Visit | Attending: Obstetrics and Gynecology | Admitting: Obstetrics and Gynecology

## 2011-07-10 DIAGNOSIS — N8000 Endometriosis of the uterus, unspecified: Secondary | ICD-10-CM | POA: Insufficient documentation

## 2011-07-10 DIAGNOSIS — K589 Irritable bowel syndrome without diarrhea: Secondary | ICD-10-CM | POA: Insufficient documentation

## 2011-07-10 DIAGNOSIS — N83209 Unspecified ovarian cyst, unspecified side: Secondary | ICD-10-CM | POA: Insufficient documentation

## 2011-07-10 DIAGNOSIS — Z30432 Encounter for removal of intrauterine contraceptive device: Secondary | ICD-10-CM | POA: Insufficient documentation

## 2011-07-10 DIAGNOSIS — Z01812 Encounter for preprocedural laboratory examination: Secondary | ICD-10-CM | POA: Insufficient documentation

## 2011-07-10 DIAGNOSIS — D509 Iron deficiency anemia, unspecified: Secondary | ICD-10-CM | POA: Insufficient documentation

## 2011-07-10 DIAGNOSIS — E039 Hypothyroidism, unspecified: Secondary | ICD-10-CM | POA: Insufficient documentation

## 2011-07-10 DIAGNOSIS — Q628 Other congenital malformations of ureter: Secondary | ICD-10-CM | POA: Insufficient documentation

## 2011-07-10 DIAGNOSIS — N920 Excessive and frequent menstruation with regular cycle: Secondary | ICD-10-CM | POA: Insufficient documentation

## 2011-07-10 DIAGNOSIS — Z01818 Encounter for other preprocedural examination: Secondary | ICD-10-CM | POA: Insufficient documentation

## 2011-07-10 DIAGNOSIS — N946 Dysmenorrhea, unspecified: Secondary | ICD-10-CM | POA: Insufficient documentation

## 2011-07-10 HISTORY — PX: LAPAROSCOPIC SUPRACERVICAL HYSTERECTOMY: SHX5399

## 2011-07-10 LAB — URINALYSIS, ROUTINE W REFLEX MICROSCOPIC
Bilirubin Urine: NEGATIVE
Nitrite: NEGATIVE
Specific Gravity, Urine: 1.02 (ref 1.005–1.030)
Urobilinogen, UA: 0.2 mg/dL (ref 0.0–1.0)
pH: 5.5 (ref 5.0–8.0)

## 2011-07-10 LAB — URINE MICROSCOPIC-ADD ON

## 2011-07-10 SURGERY — HYSTERECTOMY, SUPRACERVICAL, LAPAROSCOPIC
Anesthesia: General | Site: Uterus | Laterality: Right | Wound class: Clean Contaminated

## 2011-07-10 MED ORDER — SENNOSIDES-DOCUSATE SODIUM 8.6-50 MG PO TABS: 2.0000 | ORAL_TABLET | Freq: Every day | ORAL | Status: DC | PRN

## 2011-07-10 MED ORDER — DEXAMETHASONE SODIUM PHOSPHATE 10 MG/ML IJ SOLN
INTRAMUSCULAR | Status: DC | PRN
  Administered 2011-07-10: 10 mg via INTRAVENOUS

## 2011-07-10 MED ORDER — NALOXONE HCL 0.4 MG/ML IJ SOLN: 0.4000 mg | INTRAMUSCULAR | Status: DC | PRN

## 2011-07-10 MED ORDER — LACTATED RINGERS IV SOLN: INTRAVENOUS | Status: DC | PRN

## 2011-07-10 MED ORDER — FENTANYL CITRATE 0.05 MG/ML IJ SOLN
INTRAMUSCULAR | Status: AC
  Filled 2011-07-10: qty 5

## 2011-07-10 MED ORDER — LACTATED RINGERS IV SOLN
INTRAVENOUS | Status: DC | PRN
  Administered 2011-07-10 (×2): via INTRAVENOUS

## 2011-07-10 MED ORDER — ROCURONIUM BROMIDE 50 MG/5ML IV SOLN
INTRAVENOUS | Status: AC
Start: 2011-07-10 — End: 2011-07-10
  Filled 2011-07-10: qty 1

## 2011-07-10 MED ORDER — ROCURONIUM BROMIDE 100 MG/10ML IV SOLN
INTRAVENOUS | Status: DC | PRN
  Administered 2011-07-10: 30 mg via INTRAVENOUS

## 2011-07-10 MED ORDER — ONDANSETRON HCL 4 MG/2ML IJ SOLN
INTRAMUSCULAR | Status: DC | PRN
  Administered 2011-07-10: 4 mg via INTRAMUSCULAR

## 2011-07-10 MED ORDER — INDIGOTINDISULFONATE SODIUM 8 MG/ML IJ SOLN
INTRAMUSCULAR | Status: DC | PRN
  Administered 2011-07-10: 5 mL via INTRAVENOUS

## 2011-07-10 MED ORDER — DEXAMETHASONE SODIUM PHOSPHATE 10 MG/ML IJ SOLN
INTRAMUSCULAR | Status: AC
  Filled 2011-07-10: qty 1

## 2011-07-10 MED ORDER — ONDANSETRON HCL 4 MG/2ML IJ SOLN
INTRAMUSCULAR | Status: AC
  Filled 2011-07-10: qty 2

## 2011-07-10 MED ORDER — MEPERIDINE HCL 25 MG/ML IJ SOLN
INTRAMUSCULAR | Status: AC
  Administered 2011-07-10: 12.5 mg via INTRAVENOUS
  Filled 2011-07-10: qty 1

## 2011-07-10 MED ORDER — MIDAZOLAM HCL 2 MG/2ML IJ SOLN
INTRAMUSCULAR | Status: AC
  Filled 2011-07-10: qty 2

## 2011-07-10 MED ORDER — ONDANSETRON HCL 4 MG/2ML IJ SOLN: 4.0000 mg | Freq: Four times a day (QID) | INTRAMUSCULAR | Status: DC | PRN

## 2011-07-10 MED ORDER — TEMAZEPAM 15 MG PO CAPS
15.0000 mg | ORAL_CAPSULE | Freq: Every evening | ORAL | Status: DC | PRN
  Administered 2011-07-10: 30 mg via ORAL
  Filled 2011-07-10: qty 2

## 2011-07-10 MED ORDER — MEPERIDINE HCL 25 MG/ML IJ SOLN
6.2500 mg | INTRAMUSCULAR | Status: DC | PRN
  Administered 2011-07-10: 12.5 mg via INTRAVENOUS

## 2011-07-10 MED ORDER — ONDANSETRON HCL 4 MG/2ML IJ SOLN: 4.0000 mg | Freq: Once | INTRAMUSCULAR | Status: DC | PRN

## 2011-07-10 MED ORDER — MIDAZOLAM HCL 5 MG/5ML IJ SOLN
INTRAMUSCULAR | Status: DC | PRN
  Administered 2011-07-10: 2 mg via INTRAVENOUS

## 2011-07-10 MED ORDER — PROPOFOL 10 MG/ML IV EMUL
INTRAVENOUS | Status: AC
  Filled 2011-07-10: qty 20

## 2011-07-10 MED ORDER — MENTHOL 3 MG MT LOZG: 1.0000 | LOZENGE | OROMUCOSAL | Status: DC | PRN

## 2011-07-10 MED ORDER — FENTANYL CITRATE 0.05 MG/ML IJ SOLN
INTRAMUSCULAR | Status: DC | PRN
  Administered 2011-07-10: 50 ug via INTRAVENOUS
  Administered 2011-07-10: 100 ug via INTRAVENOUS
  Administered 2011-07-10 (×2): 50 ug via INTRAVENOUS

## 2011-07-10 MED ORDER — ACETAMINOPHEN 325 MG PO TABS: 325.0000 mg | ORAL_TABLET | ORAL | Status: DC | PRN

## 2011-07-10 MED ORDER — ONDANSETRON HCL 4 MG PO TABS: 4.0000 mg | ORAL_TABLET | Freq: Four times a day (QID) | ORAL | Status: DC | PRN

## 2011-07-10 MED ORDER — LACTATED RINGERS IV SOLN
INTRAVENOUS | Status: DC
  Administered 2011-07-10 – 2011-07-11 (×2): via INTRAVENOUS

## 2011-07-10 MED ORDER — PANTOPRAZOLE SODIUM 40 MG PO TBEC
40.0000 mg | DELAYED_RELEASE_TABLET | Freq: Every day | ORAL | Status: DC
  Administered 2011-07-10: 40 mg via ORAL
  Filled 2011-07-10 (×3): qty 1

## 2011-07-10 MED ORDER — LIDOCAINE HCL (CARDIAC) 20 MG/ML IV SOLN
INTRAVENOUS | Status: AC
  Filled 2011-07-10: qty 5

## 2011-07-10 MED ORDER — HYDROMORPHONE HCL 1 MG/ML IJ SOLN
INTRAMUSCULAR | Status: AC
  Filled 2011-07-10: qty 1

## 2011-07-10 MED ORDER — SODIUM CHLORIDE 0.9 % IJ SOLN: 9.0000 mL | INTRAMUSCULAR | Status: DC | PRN

## 2011-07-10 MED ORDER — HYDROMORPHONE HCL 1 MG/ML IJ SOLN
INTRAMUSCULAR | Status: DC | PRN
  Administered 2011-07-10: 1 mg via INTRAVENOUS

## 2011-07-10 MED ORDER — MORPHINE SULFATE (PF) 1 MG/ML IV SOLN
INTRAVENOUS | Status: DC
  Administered 2011-07-10: 25 mg via INTRAVENOUS
  Administered 2011-07-10: 9 mg via INTRAVENOUS
  Administered 2011-07-10: 3 mg via INTRAVENOUS
  Administered 2011-07-11: 4.5 mg via INTRAVENOUS
  Administered 2011-07-11: 4.8 mg via INTRAVENOUS
  Filled 2011-07-10: qty 25

## 2011-07-10 MED ORDER — BUPIVACAINE HCL (PF) 0.25 % IJ SOLN
INTRAMUSCULAR | Status: DC | PRN
  Administered 2011-07-10: 6 mL

## 2011-07-10 MED ORDER — LIDOCAINE HCL (CARDIAC) 10 MG/ML IV SOLN
INTRAVENOUS | Status: DC | PRN
  Administered 2011-07-10: 30 mg via INTRAVENOUS
  Administered 2011-07-10: 40 mg via INTRAVENOUS
  Administered 2011-07-10: 30 mg via INTRAVENOUS

## 2011-07-10 MED ORDER — FENTANYL CITRATE 0.05 MG/ML IJ SOLN: 25.0000 ug | INTRAMUSCULAR | Status: DC | PRN

## 2011-07-10 MED ORDER — LACTATED RINGERS IR SOLN
Status: DC | PRN
  Administered 2011-07-10: 1

## 2011-07-10 MED ORDER — LACTATED RINGERS IV SOLN
INTRAVENOUS | Status: DC
  Administered 2011-07-10: 10:00:00 via INTRAVENOUS

## 2011-07-10 MED ORDER — DEXTROSE 5 % IV SOLN
1.0000 g | INTRAVENOUS | Status: DC | PRN
  Administered 2011-07-10: 1 g via INTRAVENOUS

## 2011-07-10 MED ORDER — LIDOCAINE HCL (CARDIAC) 20 MG/ML IV SOLN
INTRAVENOUS | Status: DC | PRN
  Administered 2011-07-10: 40 mg via INTRAVENOUS

## 2011-07-10 MED ORDER — PROPOFOL 10 MG/ML IV EMUL
INTRAVENOUS | Status: DC | PRN
  Administered 2011-07-10: 160 mg via INTRAVENOUS

## 2011-07-10 SURGICAL SUPPLY — 29 items
BARRIER ADHS 3X4 INTERCEED (GAUZE/BANDAGES/DRESSINGS) ×3 IMPLANT
BLADE LAPAROSCOPIC MORCELL KIT (BLADE) ×3 IMPLANT
CABLE HIGH FREQUENCY MONO STRZ (ELECTRODE) IMPLANT
CANISTER SUCTION 2500CC (MISCELLANEOUS) ×3 IMPLANT
CLOTH BEACON ORANGE TIMEOUT ST (SAFETY) ×3 IMPLANT
COVER TABLE BACK 60X90 (DRAPES) ×3 IMPLANT
DECANTER SPIKE VIAL GLASS SM (MISCELLANEOUS) IMPLANT
DERMABOND ADVANCED (GAUZE/BANDAGES/DRESSINGS) ×3 IMPLANT
EVACUATOR SMOKE 8.L (FILTER) ×3 IMPLANT
FORCEPS CUTTING 33CM 5MM (CUTTING FORCEPS) ×3 IMPLANT
GLOVE BIO SURGEON STRL SZ 6.5 (GLOVE) IMPLANT
GLOVE BIO SURGEON STRL SZ8 (GLOVE) IMPLANT
GLOVE BIOGEL M 8.0 STRL (GLOVE) ×3 IMPLANT
GLOVE SS BIOGEL STRL SZ 6.5 (GLOVE) ×6 IMPLANT
GLOVE SUPERSENSE BIOGEL SZ 6.5 (GLOVE) ×3
GOWN BRE IMP SLV AUR LG STRL (GOWN DISPOSABLE) ×12 IMPLANT
PACK LAVH (CUSTOM PROCEDURE TRAY) ×3 IMPLANT
SCISSORS LAP 5X35 DISP (ENDOMECHANICALS) ×3 IMPLANT
SET CYSTO W/LG BORE CLAMP LF (SET/KITS/TRAYS/PACK) ×3 IMPLANT
SET IRRIG TUBING LAPAROSCOPIC (IRRIGATION / IRRIGATOR) ×3 IMPLANT
SPATULA 33CM PLASMA (CUTTING FORCEPS) ×3 IMPLANT
STRIP CLOSURE SKIN 1/4X3 (GAUZE/BANDAGES/DRESSINGS) IMPLANT
SUT VICRYL 0 UR6 27IN ABS (SUTURE) ×3 IMPLANT
SUT VICRYL 4-0 PS2 18IN ABS (SUTURE) ×6 IMPLANT
TOWEL OR 17X24 6PK STRL BLUE (TOWEL DISPOSABLE) ×6 IMPLANT
TRAY FOLEY CATH 14FR (SET/KITS/TRAYS/PACK) ×3 IMPLANT
WARMER LAPAROSCOPE (MISCELLANEOUS) ×3 IMPLANT
WATER STERILE IRR 1000ML POUR (IV SOLUTION) ×3 IMPLANT
WATER STERILE IRR 1000ML UROMA (IV SOLUTION) ×3 IMPLANT

## 2011-07-10 NOTE — H&P (Signed)
NAME:  Kayla Salazar, Kayla Salazar                     ACCOUNT NO.:      MEDICAL RECORD NO.:  [id number]      LOCATION:  [facility]                           FACILITY:  [facility]      PHYSICIAN:  Randye Lobo, M.D.   DATE OF BIRTH:  08-06-1976      DATE OF ADMISSION:  06/29/2011   DATE OF DISCHARGE:  06/29/2011                                 HISTORY & PHYSICAL         CHIEF COMPLAINT:  Painful and heavy periods.      HISTORY OF PRESENT ILLNESS:  The patient is a 35 year old gravida 3,   para 2-0-1-2 Caucasian female who presents with recurrent painful and   heavy menstrual bleeding.  The patient had a Mirena IUD placed in 2007,   which has temporarily controlled her symptoms.  Her symptoms have   returned now that she is a few months away from her 5-year mark of   having the Mirena IUD placed.  The patient has a long history of   menorrhagia and iron deficiency anemia which has been treated with IV   iron infusions in the past.  The patient declines any future   childbearing and she is requesting hysterectomy.      The patient had a pelvic ultrasound on April 18, 2011, which documented   a uterus with IUD in the endometrial canal.  The endometrial stripe was   6.01 mm.  The right ovary contained a 5.4-cm simple cyst and the left   ovary was within normal limits.  The patient has had negative gonorrhea   and Chlamydia cultures on April 18, 2011.  Her hemoglobin measured 11.6   on April 18, 2011.  The patient has known hypothyroidism, controlled on   Synthroid and her thyroid function testing on May 18, 2011, was within   normal limits.      PAST OBSTETRIC AND GYNECOLOGIC HISTORY:   1. Status post vaginal delivery x2.   2. Status post partial vulvar vestibulectomy with hymenectomy and       vaginal flap advancement for vulvar vestibulitis in 2003.   3. Status post Mirena IUD placement in 2007.   4. The patient has a prior ParaGard IUD which was removed in 2007.   5. Last Pap smear in May of 2011, was  within normal limits.      PAST MEDICAL HISTORY:   1. Peptic ulcer disease.   2. Hypothyroidism.   3. Iron deficiency anemia.   4. IBS.      PAST SURGICAL HISTORY:   1. Status post partial vulvar vestibulectomy, hymenectomy, with       vaginal flap advancement.   2. Status post right knee surgery for a meniscus tear.   3. Status post bilateral breast augmentation.   4. Status post EGD with biopsy and Savary dilation for dysphagia.      MEDICATIONS:  Synthroid 100 mcg p.o. daily.      ALLERGIES:   1. LATEX SENSITIVITY.   2. CODEINE.   3. PERCOCET.   4. IBUPROFEN.      SOCIAL HISTORY:  The patient is married.  She has 2 children.  She is a   Building services engineer.  She denies the use of tobacco.      FAMILY HISTORY:  Noncontributory.      REVIEW OF SYSTEMS:  Please refer to the history of present illness.  The   patient denies any symptoms of diarrhea or constipation.  She denies any   dysuria.      PHYSICAL EXAMINATION:  VITAL SIGNS:  Height 5 feet 8 inches, weight 168   pounds.   HEENT:  Normocephalic, atraumatic.   LUNGS:  Clear to auscultation bilaterally.   HEART:  S1 and S2 with a regular rate and rhythm.   ABDOMEN:  Soft and nontender and without evidence of hepatosplenomegaly   or organomegaly.   BREASTS:  Consistent with a bilateral breast augmentation.  There are no   palpable masses, enlarged axillary lymph nodes, evidence of nipple   discharge.   PELVIC:  Normal external-appearing genitalia and urethra.  The cervix   demonstrates no lesions.  There is menstrual flow noted.  The IUD   strings are not visible.  On bimanual examination, the uterus is small   and anteverted and nontender.  No adnexal masses nor tenderness are   appreciated.   EXTREMITIES:  No evidence of edema.      IMPRESSION:  The patient is a 35 year old para 2 Caucasian female with a   Mirena intrauterine device who presents with recurrent dysmenorrhea and   menorrhagia, and a simple right  ovarian cyst.      PLAN:  The patient will undergo a laparoscopic supracervical   hysterectomy with possible right ovarian cystectomy at the [facility] on July 10, 2011.  Risks, benefits, and   alternatives have been discussed with the patient who wishes to proceed.               Randye Lobo, M.D.               BES/MEDQ  D:  07/09/2011  T:  07/09/2011  Job:  [id number]

## 2011-07-10 NOTE — Transfer of Care (Signed)
Immediate Anesthesia Transfer of Care Note  Patient: Kayla Salazar  Procedure(s) Performed:  LAPAROSCOPIC SUPRACERVICAL HYSTERECTOMY - Laparoscopic Supracervical Hysterectomy & Cystoscopy  Patient Location: PACU  Anesthesia Type: General  Level of Consciousness: awake, sedated, patient cooperative and responds to stimulation  Airway & Oxygen Therapy: Patient Spontanous Breathing and Patient connected to nasal cannula oxygen  Post-op Assessment: Report given to PACU RN, Post -op Vital signs reviewed and stable and Patient moving all extremities  Post vital signs: Reviewed and stable  Complications: No apparent anesthesia complications

## 2011-07-10 NOTE — Progress Notes (Signed)
No marked change in status since office preop visit.

## 2011-07-10 NOTE — Anesthesia Postprocedure Evaluation (Signed)
Vital signs stable Patient alert Pain and nausea are controlled No apparent anesthetic complications No follow up care needed 

## 2011-07-10 NOTE — Anesthesia Procedure Notes (Addendum)
Procedures

## 2011-07-11 ENCOUNTER — Ambulatory Visit (HOSPITAL_COMMUNITY): Payer: 59

## 2011-07-11 LAB — CBC
Hemoglobin: 9.7 g/dL — ABNORMAL LOW (ref 12.0–15.0)
MCV: 87.3 fL (ref 78.0–100.0)
Platelets: 160 10*3/uL (ref 150–400)
RBC: 3.39 MIL/uL — ABNORMAL LOW (ref 3.87–5.11)
WBC: 14.7 10*3/uL — ABNORMAL HIGH (ref 4.0–10.5)

## 2011-07-11 MED ORDER — IOHEXOL 300 MG/ML  SOLN
50.0000 mL | Freq: Once | INTRAMUSCULAR | Status: AC | PRN
  Administered 2011-07-11: 50 mL via INTRAVENOUS

## 2011-07-11 MED ORDER — HYDROCODONE-ACETAMINOPHEN 5-325 MG PO TABS: 1.0000 | ORAL_TABLET | ORAL | Status: AC | PRN

## 2011-07-11 MED ORDER — HYDROCODONE-ACETAMINOPHEN 5-325 MG PO TABS
1.0000 | ORAL_TABLET | Freq: Four times a day (QID) | ORAL | Status: DC | PRN
  Administered 2011-07-11: 2 via ORAL
  Filled 2011-07-11: qty 2

## 2011-07-11 NOTE — Discharge Summary (Signed)
Physician Discharge Summary  Patient ID: Kayla Salazar MRN: [id number] DOB/AGE: [birth year] 34 y.o.  Admit date: 07/10/2011 Discharge date: 07/11/2011  Admission Diagnoses:  Dysmenorrhea.  Menorrhagia.  Right ovarian cyst.  Discharge Diagnoses:  Status post laparoscopic supracervical hysterectomy and cystoscopy. Duplication of bilateral ureters.  Discharged Condition: Good.  Hospital Course: The patient had a benign surgical course with no significant fevers.  Incisions remained clean, dry, and intact.  She had no significant bleeding.  Pain was well controlled with a morphine PCA and then vicodin.  She has developed some itching on post operative day number one which will be observed closely for a possible allergy to vicodin.  She is tolerating a regular diet, ambulating, and voiding well.  Consults:   Significant Diagnostic Studies: Patient had an IVP due to intraoperative finding of a duplication of the right ureter.  The IVP documented bilateral duplication of the ureters but the distal ureters were not visualized well.   Treatments: Post op care with IVP as noted.  Discharge Exam: Blood pressure 106/67, pulse 75, temperature 98.3 F (36.8 C), temperature source Oral, resp. rate 18, height 5\' 8"  (1.727 m), weight 75.751 kg (167 lb), SpO2 95.00%. Please refer to post op day number one note.  Disposition:  Home.  Current Discharge Medication List    START taking these medications   Details  HYDROcodone-acetaminophen (NORCO) 5-325 MG per tablet Take 1-2 tablets by mouth every 4 (four) hours as needed. Qty: 30 tablet, Refills: 0      CONTINUE these medications which have NOT CHANGED   Details  levothyroxine (SYNTHROID, LEVOTHROID) 100 MCG tablet Take 100 mcg by mouth daily.         Follow-up Information    Follow up with SILVA,Clebert Wenger A. Call in 1 month. (Call if your itching is progressive or if you develop a rash.)    Contact information:   [address] [place] [address] [phone]          Signed: [name] 07/11/2011, 1:30 PM

## 2011-07-11 NOTE — Op Note (Signed)
Kayla Salazar, Kayla Salazar                ACCOUNT NO.:  [id number]  MEDICAL RECORD NO.:  [id number]  LOCATION:  9318                          FACILITY:  [facility]  PHYSICIAN:  Randye Lobo, M.D.   DATE OF BIRTH:  06-19-1976  DATE OF PROCEDURE:  07/10/2011 DATE OF DISCHARGE:                              OPERATIVE REPORT   PREOPERATIVE DIAGNOSES: 1. Dysmenorrhea. 2. Menorrhagia. 3. Simple right ovarian cyst.  POSTOPERATIVE DIAGNOSES: 1. Dysmenorrhea. 2. Menorrhagia. 3. Duplication of right ureter.  PROCEDURES: 1. Removal of Mirena IUD. 2. Laparoscopic supracervical hysterectomy. 3. Cystoscopy.  SURGEON:  Randye Lobo, MD  ASSISTANT:  Luvenia Redden, MD  ANESTHESIA:  General endotracheal, local with 0.25% Marcaine.  IV FLUIDS:  1400 mL Ringer's lactate.  ESTIMATED BLOOD LOSS:  100 mL.  URINE OUTPUT:  100 mL.  COMPLICATIONS:  None.  INDICATIONS FOR PROCEDURE:  The patient is a 35 year old para 2 Caucasian female with a Mirena IUD who presents with recurring dysmenorrhea and menorrhagia.  The patient's symptoms were initially controlled with a Mirena IUD, however, it is almost time for the Mirena to be removed and she is now experiencing recurrence of her symptoms. An ultrasound documents a 5-cm simple right ovarian cyst.  She has had negative cervical cultures for gonorrhea and Chlamydia.  The patient has hypothyroidism and is currently euthyroid.  The patient is now requesting supracervical hysterectomy and she declines future medical therapy for her dysmenorrhea and menorrhagia.  A plan is made to proceed with a laparoscopic supracervical hysterectomy with a possible right ovarian cystectomy after risks, benefits, and alternatives were reviewed.  FINDINGS:  The patient was noted to have a normal-appearing cervix and vagina.  Laparoscopy demonstrates a normal uterus, tubes, ovaries, appendix, liver, peritoneal surfaces, and the bowel surfaces.  There was no evidence of  any adhesive disease or endometriosis in the abdomen or pelvis.  There is a duplication of the right ureter.  Cystoscopy at termination of the supracervical hysterectomy documents two right ureters and one left ureter.  All three ureters appeared to be patent and were expressing flocculent material from them.  The bladder was visualized throughout 360 degrees including the bladder dome and trigone.  The urethra was unremarkable.  SPECIMEN:  The uterine fundus was sent to Pathology and the Mirena IUD was discarded.  DESCRIPTION OF PROCEDURE:  The patient was reidentified in the preoperative hold area.  She received cefotetan IV for antibiotic prophylaxis.  She received TED hose and PAS stockings for DVT prophylaxis.  In the operating room, general endotracheal anesthesia was induced and the patient was placed in the dorsal lithotomy position.  The abdomen, vagina, and perineum were sterilely prepped and draped.  A Foley catheter was left to gravity drainage.  A single-tooth tenaculum was placed on the anterior cervical lip.  The Mirena IUD was removed with a ring forceps.  A Hulka tenaculum was then used to replace the single- tooth tenaculum.  The speculum was removed.  Attention was turned to the abdomen where a 1-cm vertical umbilical incision was created sharply with a scalpel.  The incision was carried down to the fascia using an Allis clamp.  An attempt was  made to place the 10-mm trocar directly, however, the fascia was noted to be quite strong.  A Veress needle was therefore used to enter the peritoneal cavity.  A saline drop test was performed and the saline flowed freely. A CO2 pneumoperitoneum was then achieved.  The Veress needle was removed and a 10-mm trocar was then placed inside the peritoneal cavity.  The laparoscope confirmed proper placement.  The patient was placed in the Trendelenburg position.  A 5-mm left lower quadrant incision was created and a 5-mm trocar  was placed under visualization of the laparoscope.  An 11-mm incision was created in the right lower quadrant and an 11-mm trocar was placed under direct visualization of the laparoscope.  An inspection of the pelvic and abdominal organs was performed and the findings are as noted above.  The hysterectomy was begun by grasping the patient's left fallopian tube with the gyrus instrument and cauterizing and then cutting.  The same was performed with the round ligament and with the utero-ovarian ligament on the patient's left-hand side.  Dissection continued through the broad ligament on the patient's left-hand side.  The bladder flap was created with the gyrus instrument.  The left uterine artery was then cauterized and cut using the same instrument.  The same procedure was performed on the patient's right-hand side.  Care was taken to be sure that the bladder was well down off the cervix and that the uterine arteries had been adequately cauterized and cut bilaterally prior to using the monopolar laparoscopic spatula to perform the amputation of the fundus from the cervix.  The Hulka tenaculum was removed and a 5-mm laparoscopic tenaculum was used to retract the uterine fundus to safely use the monopolar spatula to perform the supracervical hysterectomy.  The specimen was released from its final attachment using a laparoscopic scissors with no energy.  There were a couple of bleeding areas over the cervix which were oozing slightly and responded well to monopolar cautery.  The pelvis was irrigated and suctioned and hemostasis was good at this time.  The right lower quadrant incision was then used to place the laparoscopic morcellator.  The specimen was then morcellated and sent to Pathology.  Care was taken to observe the morcellator during the removal of all specimen pieces.  The pelvis was again irrigated and suctioned.  There was still a tiny amount of bleeding over the cervix  and this responded well to monopolar cautery.  The endocervix was cauterized well with a monopolar spatulate.  Attention was turned at this time to the cystoscopy.  The Foley catheter was removed and the cystoscope was placed without difficulty.  The findings are as noted above.  The Foley catheter was then replaced and the bladder was completely drained.  Attention was turned back to the laparoscopic portion of the procedure where Intercede was placed over the top of the cervix.  Any remaining irrigation fluid was removed from within the abdomen and pelvis.  The right and left lower trocars were removed under visualization of the laparoscope.  The umbilical trocar and the laparoscope were removed simultaneously after the CO2 pneumoperitoneum was released.  The right lower quadrant fascial incision was closed with a running suture of 0 Vicryl.  All skin incisions were closed with 4-0 Vicryl in a subcuticular fashion and Dermabond was placed over the incisions.  This concluded the patient's procedure.  There were no complications. All needle, instrument, sponge counts were correct.     Randye Lobo, M.D.  BES/MEDQ  D:  07/10/2011  T:  07/11/2011  Job:  [id number]

## 2011-07-11 NOTE — Progress Notes (Signed)
Patient wants to proceed with IVP inhouse to determine anatomy of renal/urinary system.  Hungry.  Good pain control.  Exam  AVVS. Lungs - CTA bilaterally. Cor- S1S2 RRR. Abdomen - + Bowel sounds, soft, nontender.  Incisions clean, dry, intact. Vaginal pad - Mild mucousy bloody drainage.  Labs - reviewed.  Assessment and Plan: 1.  Proceed with IVP. 2.  Then regular diet. 3.  Then DC PCA and start po pain meds. 4.  Then discharge home.  Instructions and precautions given. 5.  RX for vicodin 1 - 2 po q 4 - 6 hours prn.  Resume usual home meds. 6.  Follow up in 4 weeks. 7.  Surgery and findings of duplication of right ureter discussed with patient.  Questions answered.

## 2011-07-11 NOTE — H&P (Signed)
NAME:  Kayla Salazar, Kayla Salazar                     ACCOUNT NO.:  MEDICAL RECORD NO.:  [id number]  LOCATION:  [facility]                           FACILITY:  [facility]  PHYSICIAN:  Randye Lobo, M.D.   DATE OF BIRTH:  07/04/1976  DATE OF ADMISSION:  06/29/2011 DATE OF DISCHARGE:  06/29/2011                             HISTORY & PHYSICAL   CHIEF COMPLAINT:  Painful and heavy periods.  HISTORY OF PRESENT ILLNESS:  The patient is a 35 year old gravida 3, para 2-0-1-2 Caucasian female who presents with recurrent painful and heavy menstrual bleeding.  The patient had a Mirena IUD placed in 2007, which has temporarily controlled her symptoms.  Her symptoms have returned now that she is a few months away from her 5-year mark of having the Mirena IUD potentially replaced.  The patient has a long history of menorrhagia and iron deficiency anemia which has been treated with IV iron infusions in the past.  The patient declines any future childbearing and she is requesting hysterectomy.  The patient had a pelvic ultrasound on April 18, 2011, which documented a uterus with IUD in the endometrial canal.  The endometrial stripe was 6.01 mm.  The right ovary contained a 5.4-cm simple cyst and the left ovary was within normal limits.  The patient has had negative gonorrhea and chlamydia cultures on April 18, 2011.  Her hemoglobin measured 11.6 on April 18, 2011.  The patient has known hypothyroidism, controlled on Synthroid and her thyroid function testing on May 18, 2011, was within normal limits.  PAST OBSTETRIC AND GYNECOLOGIC HISTORY: 1. Status post vaginal delivery x2. 2. Status post partial vulvar vestibulectomy with hymenectomy and     vaginal flap advancement for vulvar vestibulitis in 2003. 3. Status post Mirena IUD placement in 2007. 4. The patient has a prior ParaGard IUD which was removed in 2007. 5. Last Pap smear in May of 2011, was within normal limits.  PAST MEDICAL HISTORY: 1. Peptic ulcer  disease. 2. Hypothyroidism. 3. Iron deficiency anemia. 4. IBS.  PAST SURGICAL HISTORY: 1. Status post partial vulvar vestibulectomy, hymenectomy, with     vaginal flap advancement. 2. Status post right knee surgery for a meniscus tear. 3. Status post bilateral breast augmentation. 4. Status post EGD with biopsy and Savary dilation for dysphagia.  MEDICATIONS:  Synthroid 100 mcg p.o. daily.  ALLERGIES: 1. LATEX SENSITIVITY. 2. CODEINE. 3. PERCOCET. 4. IBUPROFEN.  SOCIAL HISTORY:  The patient is married.  She has 2 children.  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  FAMILY HISTORY:  Noncontributory.  REVIEW OF SYSTEMS:  Please refer to the history of present illness.  The patient denies any symptoms of diarrhea or constipation.  She denies any dysuria.  PHYSICAL EXAMINATION:  VITAL SIGNS:  Height 5 feet 8 inches, weight 168 pounds. HEENT:  Normocephalic, atraumatic. LUNGS:  Clear to auscultation bilaterally. HEART:  S1 and S2 with a regular rate and rhythm. ABDOMEN:  Soft and nontender and without evidence of hepatosplenomegaly or organomegaly. BREASTS:  Consistent with a bilateral breast augmentation.  There are no palpable masses, enlarged axillary lymph nodes, evidence of nipple discharge. PELVIC:  Normal external-appearing genitalia and urethra.  The cervix demonstrates no lesions.  There is menstrual flow noted.  The IUD strings are not visible.  On bimanual examination, the uterus is small and anteverted and nontender.  No adnexal masses nor tenderness are appreciated. EXTREMITIES:  No evidence of edema.  IMPRESSION:  The patient is a 35 year old para 2 Caucasian female with a Mirena intrauterine device who presents with recurrent dysmenorrhea and menorrhagia, and a simple right ovarian cyst.  PLAN:  The patient will undergo a laparoscopic supracervical hysterectomy with possible right ovarian cystectomy at the [facility]  [place] on July 10, 2011.  Risks, benefits, and alternatives have been discussed with the patient who wishes to proceed.     Randye Lobo, M.D.     BES/MEDQ  D:  07/09/2011  T:  07/09/2011  Job:  [id number]

## 2011-07-24 ENCOUNTER — Encounter (HOSPITAL_COMMUNITY): Payer: Self-pay | Admitting: Obstetrics and Gynecology

## 2011-09-24 LAB — INFLUENZA A AND B ANTIGEN (CONVERTED LAB): Inflenza A Ag: POSITIVE — AB

## 2012-12-09 ENCOUNTER — Other Ambulatory Visit: Payer: Self-pay | Admitting: Orthopedic Surgery

## 2012-12-09 DIAGNOSIS — M25569 Pain in unspecified knee: Secondary | ICD-10-CM

## 2012-12-16 ENCOUNTER — Ambulatory Visit
Admission: RE | Admit: 2012-12-16 | Discharge: 2012-12-16 | Disposition: A | Discharge status: Home / Self Care | Payer: 59 | Source: Ambulatory Visit | Attending: Orthopedic Surgery | Admitting: Orthopedic Surgery

## 2012-12-16 ENCOUNTER — Other Ambulatory Visit: Payer: 59

## 2012-12-16 DIAGNOSIS — M25569 Pain in unspecified knee: Secondary | ICD-10-CM

## 2013-03-20 ENCOUNTER — Encounter: Payer: Self-pay | Admitting: Obstetrics and Gynecology

## 2013-03-24 ENCOUNTER — Encounter: Payer: Self-pay | Admitting: Obstetrics and Gynecology

## 2013-03-26 ENCOUNTER — Encounter: Payer: Self-pay | Admitting: Obstetrics and Gynecology

## 2013-04-08 ENCOUNTER — Encounter: Payer: Self-pay | Admitting: Obstetrics and Gynecology

## 2013-04-29 ENCOUNTER — Ambulatory Visit (INDEPENDENT_AMBULATORY_CARE_PROVIDER_SITE_OTHER): Payer: 59 | Admitting: Obstetrics and Gynecology

## 2013-04-29 ENCOUNTER — Encounter: Payer: Self-pay | Admitting: Obstetrics and Gynecology

## 2013-04-29 VITALS — BP 120/80 | Ht 67.0 in | Wt 185.0 lb

## 2013-04-29 DIAGNOSIS — Z01419 Encounter for gynecological examination (general) (routine) without abnormal findings: Secondary | ICD-10-CM

## 2013-04-29 DIAGNOSIS — Z8261 Family history of arthritis: Secondary | ICD-10-CM

## 2013-04-29 DIAGNOSIS — E039 Hypothyroidism, unspecified: Secondary | ICD-10-CM

## 2013-04-29 DIAGNOSIS — Z0000 Encounter for general adult medical examination without abnormal findings: Secondary | ICD-10-CM

## 2013-04-29 LAB — LIPID PANEL
Cholesterol: 162 mg/dL (ref 0–200)
HDL: 52 mg/dL (ref >39)
Total CHOL/HDL Ratio: 3.1 Ratio
Triglycerides: 69 mg/dL (ref <150)

## 2013-04-29 LAB — POCT URINALYSIS DIPSTICK
Glucose, UA: NEGATIVE
Leukocytes, UA: NEGATIVE
Nitrite, UA: NEGATIVE
Protein, UA: NEGATIVE
Urobilinogen, UA: NEGATIVE

## 2013-04-29 LAB — THYROID PANEL WITH TSH
Free Thyroxine Index: 2.3 (ref 1.0–3.9)
T3 Uptake: 32.4 % (ref 22.5–37.0)
T4, Total: 7 ug/dL (ref 5.0–12.5)
TSH: 4.63 u[IU]/mL — ABNORMAL HIGH (ref 0.350–4.500)

## 2013-04-29 LAB — COMPREHENSIVE METABOLIC PANEL
Albumin: 4.2 g/dL (ref 3.5–5.2)
CO2: 25 mEq/L (ref 19–32)
Glucose, Bld: 89 mg/dL (ref 70–99)
Potassium: 4.4 mEq/L (ref 3.5–5.3)
Sodium: 139 mEq/L (ref 135–145)
Total Protein: 7.1 g/dL (ref 6.0–8.3)

## 2013-04-29 LAB — RHEUMATOID FACTOR: Rheumatoid fact SerPl-aCnc: <10 [IU]/mL (ref <=14)

## 2013-04-29 LAB — SEDIMENTATION RATE: Sed Rate: 10 mm/hr (ref 0–22)

## 2013-04-29 MED ORDER — LEVOTHYROXINE SODIUM 100 MCG PO TABS: 100.0000 ug | ORAL_TABLET | Freq: Every day | ORAL | Status: DC

## 2013-04-29 NOTE — Progress Notes (Signed)
Patient ID: Kayla Salazar, female   DOB: 1976-10-05, 37 y.o.   MRN: [id number] 37 y.o.  Married  Caucasian female   [id number] here for annual exam.   Patient has aching joints.  Wants a rheumatoid panel.  Wants general bloodwork.  Fasted today.  Needs a refill of thyroid medication.    Occasional cramping more on the left side than the right side.  History of anemia.  Did iron injections in the past.  Now if stays above 10.0 Dr. Myna Hidalgo will not do infusion.    Has some rectal bleeding several months ago attributed to a hemorrhoid.  Resolved.  Used some over the counter creams and changed diet.    Patient's last menstrual period was 06/22/2011.          Sexually active: yes  The current method of family planning is status post hysterectomy.    Exercising: NO Last mammogram:  Never  Last pap smear:06/2011 wnl: no HPV done. History of abnormal pap:  no Smoking: no Alcohol: no Last colonoscopy: 2010 wnl: Dr. Signe Colt [facility] Last Bone Density:  never Last tetanus shot: 1994 Last cholesterol check: 2012  Hgb: 11.0               Urine: Neg    Health Maintenance  Topic Date Due  . Pap Smear  07/30/1993  . Tetanus/tdap  07/30/1994  . Influenza Vaccine  08/31/2013    Family History  Problem Relation Age of Onset  . Rheum arthritis Mother   . Hypertension Mother   . Migraines Mother   . Hyperlipidemia Mother   . Thyroid disease Mother   . Thyroid disease Father   . Seizures Sister   . Hypertension Maternal Grandmother   . Hyperlipidemia Maternal Grandmother   . Stroke Maternal Grandmother   . Hypertension Paternal Grandmother   . Stroke Paternal Grandmother     Patient Active Problem List   Diagnosis Date Noted  . OVERWEIGHT 07/07/2008  . ANEMIA-IRON DEFICIENCY 07/07/2008  . ANEMIA, SEVERE 07/07/2008  . GERD 07/07/2008  . PEPTIC ULCER DISEASE 07/07/2008  . IBS 07/07/2008  . BACK PAIN 07/07/2008  . MYALGIA 07/07/2008  . SLEEPINESS 07/07/2008  . PUD, HX OF  07/07/2008    Past Medical History  Diagnosis Date  . Anemia   . Gastric ulcer   . Iron deficiency anemia     hx iron infusions with Dr. Myna Hidalgo  . Thyroid disease     hypothyroid    Past Surgical History  Procedure Laterality Date  . Right knee    . Eye surgery      lasik  . Breast surgery      augmentation  . Laparoscopic supracervical hysterectomy  07/10/2011    Procedure: LAPAROSCOPIC SUPRACERVICAL HYSTERECTOMY;  Surgeon: Melony Overly;  Location: [facility];  Service: Gynecology;  Laterality: N/A;  Laparoscopic Supracervical Hysterectomy & Cystoscopy  . Vestibuectomy  2003    Dr Edward Jolly  . Augmentation mammaplasty  2004    Allergies: Codeine and Latex  Current Outpatient Prescriptions  Medication Sig Dispense Refill  . Calcium-Vitamin D-Vitamin K (CALCIUM SOFT CHEWS) 500-100-40 MG-UNT-MCG CHEW Chew 1 tablet by mouth 2 (two) times daily.      Marland Kitchen ibuprofen (ADVIL,MOTRIN) 200 MG tablet Take 200 mg by mouth every 6 (six) hours as needed for pain.      Marland Kitchen levothyroxine (SYNTHROID, LEVOTHROID) 100 MCG tablet Take 100 mcg by mouth daily.         No current  facility-administered medications for this visit.    ROS: Pertinent items are noted in HPI.  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.  Married.  New home in [facility]  Exam:    BP 120/80  Ht 5\' 7"  (1.702 m)  Wt 185 lb (83.915 kg)  BMI 28.97 kg/m2  LMP 06/22/2011   Wt Readings from Last 3 Encounters:  04/29/13 185 lb (83.915 kg)  07/10/11 167 lb (75.751 kg)  07/10/11 167 lb (75.751 kg)     Ht Readings from Last 3 Encounters:  04/29/13 5\' 7"  (1.702 m)  07/10/11 5\' 8"  (1.727 m)  07/10/11 5\' 8"  (1.727 m)    General appearance: alert, cooperative and appears stated age Head: Normocephalic, without obvious abnormality, atraumatic Neck: no adenopathy, supple, symmetrical, trachea midline and thyroid not enlarged, symmetric, no tenderness/mass/nodules Lungs: clear to auscultation  bilaterally Breasts: Inspection negative, No nipple retraction or dimpling, No nipple discharge or bleeding, No axillary or supraclavicular adenopathy, Normal to palpation without dominant masses.  Exam consistent with bilateral implants. Heart: regular rate and rhythm Abdomen: soft, non-tender; bowel sounds normal; no masses,  no organomegaly Extremities: extremities normal, atraumatic, no cyanosis or edema Skin: Skin color, texture, turgor normal. No rashes or lesions Lymph nodes: Cervical, supraclavicular, and axillary nodes normal. No abnormal inguinal nodes palpated Neurologic: Grossly normal   Pelvic: External genitalia:  no lesions              Urethra:  normal appearing urethra with no masses, tenderness or lesions              Bartholins and Skenes: normal                 Vagina: normal appearing vagina with normal color and discharge, no lesions              Cervix: normal appearance              Pap taken: yes and high risk HPV        Bimanual Exam:  Uterus:  uterus is normal size, shape, consistency and nontender                                      Adnexa: normal adnexa in size, nontender and no masses                                      Rectovaginal: Confirms                                      Anus:  normal sphincter tone, no lesions  A: normal gyn exam Supracervical hysterectomy patient. Hypothroidism Joint Pain Family history of rheumatoid arthritis      P: mammogram age 71 years old Do self breast exam pap smear and HR HPV testing Cholesterol panel, thyroid panel, CMP, ANA, Sed rate, Rheumatoid factor return annually or prn     An After Visit Summary was printed and given to the patient.

## 2013-04-29 NOTE — Patient Instructions (Signed)

## 2013-04-30 ENCOUNTER — Other Ambulatory Visit: Payer: Self-pay | Admitting: Obstetrics and Gynecology

## 2013-04-30 DIAGNOSIS — Z8261 Family history of arthritis: Secondary | ICD-10-CM

## 2013-04-30 DIAGNOSIS — E039 Hypothyroidism, unspecified: Secondary | ICD-10-CM

## 2013-04-30 LAB — ANTI-NUCLEAR AB-TITER (ANA TITER)

## 2013-04-30 LAB — HEMOGLOBIN, FINGERSTICK: Hemoglobin, fingerstick: 11 g/dL — ABNORMAL LOW (ref 12.0–16.0)

## 2013-04-30 MED ORDER — LEVOTHYROXINE SODIUM 100 MCG PO TABS: 100.0000 ug | ORAL_TABLET | Freq: Every day | ORAL | Status: DC

## 2013-04-30 MED ORDER — LEVOTHYROXINE SODIUM 125 MCG PO TABS: 125.0000 ug | ORAL_TABLET | Freq: Every day | ORAL | Status: DC

## 2013-05-01 LAB — IPS PAP TEST WITH HPV

## 2013-05-13 ENCOUNTER — Telehealth: Payer: Self-pay | Admitting: Obstetrics and Gynecology

## 2013-05-13 NOTE — Telephone Encounter (Signed)
Called in regards to a recommendation/referral needed.

## 2013-05-13 NOTE — Telephone Encounter (Signed)
LM for pt that [facility] has been trying to call her to make an appt. Instructed it may have been their office that called her earlier. Phone number [phone].

## 2013-05-13 NOTE — Telephone Encounter (Signed)
Patient returned phone call specifically to Amy who had just called her back.

## 2013-05-13 NOTE — Telephone Encounter (Signed)
Spoke to receptionist about getting appt for referral. Instructed we have called several times to check on referral and if pt has appt. Notified that they have tried to reach pt several times and left messages for her with no response. Gave receptionist pt's work number. She will call pt now.

## 2013-05-15 NOTE — Telephone Encounter (Signed)
Call to patient to follow up on call from [facility].  Patient state she called them back and appt is sched for 06-30-13

## 2013-07-09 ENCOUNTER — Telehealth: Payer: Self-pay

## 2013-07-09 NOTE — Telephone Encounter (Signed)
Patient notified thyroid studies normal.  Patient should continue on current dosage of Synthroid . and recheck thyroid Studies in 3 months.  Order placed in Epic.  Patient works for [employer] and will have drawn there and sent to [place].

## 2013-07-09 NOTE — Telephone Encounter (Signed)
LMOVM Cell# to call to discuss Thyroid results which was drawn on 07-01-13 through [facility].(did not come through Epic--was scanned  Into Epic).

## 2013-08-17 ENCOUNTER — Encounter: Payer: Self-pay | Admitting: Internal Medicine

## 2013-08-17 ENCOUNTER — Encounter: Payer: Self-pay | Admitting: *Deleted

## 2013-08-17 ENCOUNTER — Ambulatory Visit (INDEPENDENT_AMBULATORY_CARE_PROVIDER_SITE_OTHER): Payer: 59 | Admitting: Internal Medicine

## 2013-08-17 VITALS — BP 138/84 | HR 76 | Temp 98.9°F | Wt 186.0 lb

## 2013-08-17 DIAGNOSIS — J029 Acute pharyngitis, unspecified: Secondary | ICD-10-CM

## 2013-08-17 DIAGNOSIS — Z23 Encounter for immunization: Secondary | ICD-10-CM

## 2013-08-17 MED ORDER — AMOXICILLIN 500 MG PO CAPS: 500.0000 mg | ORAL_CAPSULE | Freq: Three times a day (TID) | ORAL | Status: DC

## 2013-08-17 NOTE — Progress Notes (Signed)
HPI  Pt presents to the clinic today with c/o cold symptoms x 2 weeks. The worst part is the sore throat and dry cough.She does not produce any sputum. She has also been running fevers. She has taken Ibuprofen. She has no history of allergies or asthma. She has had sick contacts.  Review of Systems      Past Medical History  Diagnosis Date  . Anemia   . Gastric ulcer   . Iron deficiency anemia     hx iron infusions with Dr. Myna Hidalgo  . Thyroid disease     hypothyroid    Family History  Problem Relation Age of Onset  . Rheum arthritis Mother   . Hypertension Mother   . Migraines Mother   . Hyperlipidemia Mother   . Thyroid disease Mother   . Thyroid disease Father   . Seizures Sister   . Hypertension Maternal Grandmother   . Hyperlipidemia Maternal Grandmother   . Stroke Maternal Grandmother   . Hypertension Paternal Grandmother   . Stroke Paternal Grandmother     History   Social History  . Marital Status: Married    Spouse Name: N/A    Number of Children: N/A  . Years of Education: N/A   Occupational History  . Not on file.   Social History Main Topics  . Smoking status: Never Smoker   . Smokeless tobacco: Not on file  . Alcohol Use: No  . Drug Use: No  . Sexual Activity: Yes    Birth Control/ Protection: Surgical     Comment: supracervic hyst 2012   Other Topics Concern  . Not on file   Social History Narrative  . No narrative on file    Allergies  Allergen Reactions  . Codeine     REACTION: Hives/itching/sweats  . Latex Itching     Constitutional: Positive headache, fatigue and fever. Denies abrupt weight changes.  HEENT:  Positive sore throat. Denies eye redness, eye pain, pressure behind the eyes, facial pain, nasal congestion, ear pain, ringing in the ears, wax buildup, runny nose or bloody nose. Respiratory: Positive cough. Denies difficulty breathing or shortness of breath.  Cardiovascular: Denies chest pain, chest tightness, palpitations  or swelling in the hands or feet.   No other specific complaints in a complete review of systems (except as listed in HPI above).  Objective:   BP 138/84  Pulse 76  Temp(Src) 98.9 F (37.2 C) (Oral)  Wt 186 lb (84.369 kg)  BMI 29.12 kg/m2  SpO2 99%  LMP 06/22/2011 Wt Readings from Last 3 Encounters:  08/17/13 186 lb (84.369 kg)  04/29/13 185 lb (83.915 kg)  07/10/11 167 lb (75.751 kg)     General: Appears her stated age, well developed, well nourished in NAD. HEENT: Head: normal shape and size; Eyes: sclera white, no icterus, conjunctiva pink, PERRLA and EOMs intact; Ears: Tm's gray and intact, normal light reflex; Nose: mucosa pink and moist, septum midline; Throat/Mouth: + PND. Teeth present, mucosa erythematous and moist, exudate noted on left tonsillar pillar, no lesions or ulcerations noted.  Neck: Mild tonsillar lymphadenopathy. Neck supple, trachea midline. No massses, lumps or thyromegaly present.  Cardiovascular: Normal rate and rhythm. S1,S2 noted.  No murmur, rubs or gallops noted. No JVD or BLE edema. No carotid bruits noted. Pulmonary/Chest: Normal effort and positive vesicular breath sounds. No respiratory distress. No wheezes, rales or ronchi noted.      Assessment & Plan:   Acute pharyngitis, likely bacterial:  Get some rest and  drink plenty of water Do salt water gargles for the sore throat Out of RST, will do throat culture eRx for amoxil 500 mg TID x 10 days  RTC as needed or if symptoms persist.

## 2013-08-17 NOTE — Addendum Note (Signed)
Addended by: Edwena Felty T on: 08/17/2013 03:32 PM   Modules accepted: Orders

## 2013-08-17 NOTE — Patient Instructions (Signed)
Strep Throat  Strep throat is an infection of the throat caused by a bacteria named Streptococcus pyogenes. Your caregiver may call the infection streptococcal "tonsillitis" or "pharyngitis" depending on whether there are signs of inflammation in the tonsils or back of the throat. Strep throat is most common in children aged 38 15 years during the cold months of the year, but it can occur in people of any age during any season. This infection is spread from person to person (contagious) through coughing, sneezing, or other close contact.  SYMPTOMS   · Fever or chills.  · Painful, swollen, red tonsils or throat.  · Pain or difficulty when swallowing.  · White or yellow spots on the tonsils or throat.  · Swollen, tender lymph nodes or "glands" of the neck or under the jaw.  · Red rash all over the body (rare).  DIAGNOSIS   Many different infections can cause the same symptoms. A test must be done to confirm the diagnosis so the right treatment can be given. A "rapid strep test" can help your caregiver make the diagnosis in a few minutes. If this test is not available, a light swab of the infected area can be used for a throat culture test. If a throat culture test is done, results are usually available in a day or two.  TREATMENT   Strep throat is treated with antibiotic medicine.  HOME CARE INSTRUCTIONS   · Gargle with 1 tsp of salt in 1 cup of warm water, 3 4 times per day or as needed for comfort.  · Family members who also have a sore throat or fever should be tested for strep throat and treated with antibiotics if they have the strep infection.  · Make sure everyone in your household washes their hands well.  · Do not share food, drinking cups, or personal items that could cause the infection to spread to others.  · You may need to eat a soft food diet until your sore throat gets better.  · Drink enough water and fluids to keep your urine clear or pale yellow. This will help prevent dehydration.  · Get plenty of  rest.  · Stay home from school, daycare, or work until you have been on antibiotics for 24 hours.  · Only take over-the-counter or prescription medicines for pain, discomfort, or fever as directed by your caregiver.  · If antibiotics are prescribed, take them as directed. Finish them even if you start to feel better.  SEEK MEDICAL CARE IF:   · The glands in your neck continue to enlarge.  · You develop a rash, cough, or earache.  · You cough up green, yellow-brown, or bloody sputum.  · You have pain or discomfort not controlled by medicines.  · Your problems seem to be getting worse rather than better.  SEEK IMMEDIATE MEDICAL CARE IF:   · You develop any new symptoms such as vomiting, severe headache, stiff or painful neck, chest pain, shortness of breath, or trouble swallowing.  · You develop severe throat pain, drooling, or changes in your voice.  · You develop swelling of the neck, or the skin on the neck becomes red and tender.  · You have a fever.  · You develop signs of dehydration, such as fatigue, dry mouth, and decreased urination.  · You become increasingly sleepy, or you cannot wake up completely.  Document Released: 12/14/2000 Document Revised: 12/03/2012 Document Reviewed: 02/15/2011  ExitCare® Patient Information ©2014 [employer].

## 2013-08-20 ENCOUNTER — Other Ambulatory Visit: Payer: Self-pay | Admitting: Internal Medicine

## 2013-08-20 ENCOUNTER — Telehealth: Payer: Self-pay | Admitting: *Deleted

## 2013-08-20 MED ORDER — PREDNISONE 10 MG PO TABS: ORAL_TABLET | ORAL | Status: DC

## 2013-08-20 NOTE — Telephone Encounter (Signed)
pred taper sent to [facility] long outpatient pharmacy

## 2013-08-20 NOTE — Telephone Encounter (Signed)
Pt called states she has began experiencing nasal congestion and cough, in addition to the strep throat diagnosis.  Pt is requesting a steroid along with the antibiotic.  Please advise

## 2013-08-25 ENCOUNTER — Telehealth: Payer: Self-pay | Admitting: *Deleted

## 2013-08-25 NOTE — Telephone Encounter (Signed)
Spoke with pt advised rx sent. 

## 2013-08-25 NOTE — Telephone Encounter (Signed)
Left msg for pt to return call.

## 2013-08-28 ENCOUNTER — Other Ambulatory Visit: Payer: 59

## 2013-08-28 DIAGNOSIS — J029 Acute pharyngitis, unspecified: Secondary | ICD-10-CM

## 2013-08-28 DIAGNOSIS — E039 Hypothyroidism, unspecified: Secondary | ICD-10-CM

## 2013-09-01 ENCOUNTER — Telehealth: Payer: Self-pay | Admitting: Internal Medicine

## 2013-09-01 NOTE — Telephone Encounter (Signed)
Pt is aware of Dr. Debby Bud massage; waiting to see if [facility] will ok this lab order. Please advise.

## 2013-09-01 NOTE — Telephone Encounter (Signed)
Left message for patient to call back  

## 2013-09-01 NOTE — Telephone Encounter (Signed)
I have never seen this patient. Reviewed Kayla Salazar's note: a CBCD was ordered Aug 18th. No indication for liver functions or renal function based on that visit. She may need to provide reasons for these labs or see Kayla Salazar.

## 2013-09-01 NOTE — Telephone Encounter (Signed)
Pt request lab order for CBC with Diff v58.69, ALT/AST and Creatinine/albumin. Pt stated Baity only order CBC back in 08/17/13, now pt need all three to be order. Please call pt if this is ok.

## 2013-09-02 NOTE — Telephone Encounter (Signed)
Called pt to schedule an appt with [facility]; pt stated that she is not going to see Dr. Debby Bud and she will find other way to get this lab order.

## 2013-09-02 NOTE — Telephone Encounter (Signed)
Pt needs to be seen

## 2013-09-04 LAB — THYROID PANEL WITH TSH: Free Thyroxine Index: 3.1 (ref 1.0–3.9)

## 2013-09-06 ENCOUNTER — Other Ambulatory Visit: Payer: Self-pay | Admitting: Obstetrics and Gynecology

## 2013-09-06 DIAGNOSIS — E039 Hypothyroidism, unspecified: Secondary | ICD-10-CM

## 2013-09-07 ENCOUNTER — Other Ambulatory Visit: Payer: Self-pay | Admitting: *Deleted

## 2013-09-07 ENCOUNTER — Ambulatory Visit (HOSPITAL_BASED_OUTPATIENT_CLINIC_OR_DEPARTMENT_OTHER): Payer: 59 | Admitting: Lab

## 2013-09-07 DIAGNOSIS — D509 Iron deficiency anemia, unspecified: Secondary | ICD-10-CM

## 2013-09-07 DIAGNOSIS — D649 Anemia, unspecified: Secondary | ICD-10-CM

## 2013-09-07 LAB — CBC WITH DIFFERENTIAL/PLATELET
Eosinophils Absolute: 0.1 10*3/uL (ref 0.0–0.5)
MONO#: 0.5 10*3/uL (ref 0.1–0.9)
NEUT#: 6.4 10*3/uL (ref 1.5–6.5)
Platelets: 234 10*3/uL (ref 145–400)
RBC: 3.79 10*6/uL (ref 3.70–5.45)
RDW: 14.4 % (ref 11.2–14.5)
WBC: 9.2 10*3/uL (ref 3.9–10.3)

## 2013-09-07 LAB — HOLD TUBE, BLOOD BANK

## 2013-09-07 NOTE — Progress Notes (Signed)
Pt called to report her Hgb is low again and thinks she needs to have another iron treatment. Her rheumatologist has been checking her CBCs as she was recently placed on Plaquenil.  Her last hgb was in the 9s. To set her up with a lab only appt  at [facility] to verify her anemia is related to low iron. 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

## 2013-09-08 ENCOUNTER — Telehealth: Payer: Self-pay | Admitting: Hematology & Oncology

## 2013-09-08 ENCOUNTER — Other Ambulatory Visit: Payer: Self-pay | Admitting: *Deleted

## 2013-09-08 DIAGNOSIS — D509 Iron deficiency anemia, unspecified: Secondary | ICD-10-CM

## 2013-09-08 LAB — IRON AND TIBC CHCC: UIBC: 388 ug/dL — ABNORMAL HIGH (ref 120–384)

## 2013-09-08 MED ORDER — SODIUM CHLORIDE 0.9 % IV SOLN: 1020.0000 mg | Freq: Once | INTRAVENOUS | Status: DC

## 2013-09-08 NOTE — Progress Notes (Signed)
Pt called to obtain results of her labs from yesterday. She is iron deficient per Dr Myna Hidalgo and needs to receive FeraHeme 1020 mg x 1. She has received Infed in the past with mild aches post infusion at home. Reviewed FeraHeme with her to include administration, side effects, and after care with verbalized understanding. Will request an appt with scheduling for Sept 18th at 3pm. Explained that it will have to be authorized by her insurance. Faxed a copy of her labs to [phone].

## 2013-09-08 NOTE — Telephone Encounter (Signed)
Left vm w patient today to confirm upcoming appt.

## 2013-09-09 ENCOUNTER — Other Ambulatory Visit: Payer: Self-pay | Admitting: *Deleted

## 2013-09-09 DIAGNOSIS — D509 Iron deficiency anemia, unspecified: Secondary | ICD-10-CM

## 2013-09-17 ENCOUNTER — Ambulatory Visit (HOSPITAL_BASED_OUTPATIENT_CLINIC_OR_DEPARTMENT_OTHER): Payer: 59

## 2013-09-17 VITALS — BP 118/71 | HR 78 | Temp 98.1°F | Resp 20

## 2013-09-17 DIAGNOSIS — D509 Iron deficiency anemia, unspecified: Secondary | ICD-10-CM

## 2013-09-17 MED ORDER — SODIUM CHLORIDE 0.9 % IV SOLN
1020.0000 mg | Freq: Once | INTRAVENOUS | Status: AC
  Administered 2013-09-17: 1020 mg via INTRAVENOUS
  Filled 2013-09-17: qty 34

## 2013-09-17 MED ORDER — SODIUM CHLORIDE 0.9 % IV SOLN
Freq: Once | INTRAVENOUS | Status: AC
  Administered 2013-09-17: 15:00:00 via INTRAVENOUS

## 2013-09-17 NOTE — Addendum Note (Signed)
Addended by: Arlan Organ R on: 09/17/2013 05:06 PM   Modules accepted: Orders

## 2013-09-17 NOTE — Patient Instructions (Addendum)
Ferumoxytol injection What is this medicine? FERUMOXYTOL is an iron complex. Iron is used to make healthy red blood cells, which carry oxygen and nutrients throughout the body. This medicine is used to treat iron deficiency anemia in people with chronic kidney disease. This medicine may be used for other purposes; ask your health care provider or pharmacist if you have questions. What should I tell my health care provider before I take this medicine? They need to know if you have any of these conditions: -anemia not caused by low iron levels -high levels of iron in the blood -magnetic resonance imaging (MRI) test scheduled -an unusual or allergic reaction to iron, other medicines, foods, dyes, or preservatives -pregnant or trying to get pregnant -breast-feeding How should I use this medicine? This medicine is for infusion into a vein. It is given by a health care professional in a hospital or clinic setting. Talk to your pediatrician regarding the use of this medicine in children. Special care may be needed. Overdosage: If you think you've taken too much of this medicine contact a poison control center or emergency room at once. Overdosage: If you think you have taken too much of this medicine contact a poison control center or emergency room at once. NOTE: This medicine is only for you. Do not share this medicine with others. What if I miss a dose? It is important not to miss your dose. Call your doctor or health care professional if you are unable to keep an appointment. What may interact with this medicine? This medicine may interact with the following medications: -other iron products This list may not describe all possible interactions. Give your health care provider a list of all the medicines, herbs, non-prescription drugs, or dietary supplements you use. Also tell them if you smoke, drink alcohol, or use illegal drugs. Some items may interact with your medicine. What should I watch  for while using this medicine? Visit your doctor or healthcare professional regularly. Tell your doctor or healthcare professional if your symptoms do not start to get better or if they get worse. You may need blood work done while you are taking this medicine. You may need to follow a special diet. Talk to your doctor. Foods that contain iron include: whole grains/cereals, dried fruits, beans, or peas, leafy green vegetables, and organ meats (liver, kidney). What side effects may I notice from receiving this medicine? Side effects that you should report to your doctor or health care professional as soon as possible: -allergic reactions like skin rash, itching or hives, swelling of the face, lips, or tongue -breathing problems -changes in blood pressure -feeling faint or lightheaded, falls -fever or chills -flushing, sweating, or hot feelings -swelling of the ankles or feet Side effects that usually do not require medical attention (Report these to your doctor or health care professional if they continue or are bothersome.): -diarrhea -headache -nausea, vomiting -stomach pain This list may not describe all possible side effects. Call your doctor for medical advice about side effects. You may report side effects to FDA at 1-800-FDA-1088. Where should I keep my medicine? This drug is given in a hospital or clinic and will not be stored at home. NOTE: This sheet is a summary. It may not cover all possible information. If you have questions about this medicine, talk to your doctor, pharmacist, or health care provider.  2013, Elsevier/Gold Standard. (09/08/2008 9:48:25 PM) Iron Deficiency Anemia  Anemia is when you have a low number of healthy red blood cells.  HOME CARE   Ask your doctor or dietician what foods you should eat.  Take iron and vitamins as told by your doctor.  Eat foods that have iron in them. This includes liver, lean beef, whole-grain bread, eggs, dried fruit, and dark green  leafy vegetables. GET HELP RIGHT AWAY IF:  You pass out (faint).  You have chest pain.  You feel sick to your stomach (nauseous) or throw up (vomit).  You get very short of breath with activity.  You are weak.  You are thirstier than normal.  You have a fast heartbeat.  You start to sweat or become lightheaded when getting up from a chair or bed. MAKE SURE YOU:  Understand these instructions.  Will watch your condition.  Will get help right away if you are not doing well or get worse. Document Released: 01/19/2011 Document Revised: 03/10/2012 Document Reviewed: 01/19/2011 [facility] Patient Information 2014 [place], [place].

## 2013-10-26 ENCOUNTER — Telehealth: Payer: Self-pay | Admitting: Hematology & Oncology

## 2013-10-26 NOTE — Telephone Encounter (Signed)
Patient called and cx 11/02/13 apt and resch for 11/10/13

## 2013-11-02 ENCOUNTER — Other Ambulatory Visit: Payer: 59 | Admitting: Lab

## 2013-11-02 ENCOUNTER — Ambulatory Visit: Payer: 59 | Admitting: Hematology & Oncology

## 2013-11-05 ENCOUNTER — Other Ambulatory Visit: Payer: Self-pay

## 2013-11-10 ENCOUNTER — Other Ambulatory Visit (HOSPITAL_BASED_OUTPATIENT_CLINIC_OR_DEPARTMENT_OTHER): Payer: 59 | Admitting: Lab

## 2013-11-10 ENCOUNTER — Ambulatory Visit (HOSPITAL_BASED_OUTPATIENT_CLINIC_OR_DEPARTMENT_OTHER)
Admission: RE | Admit: 2013-11-10 | Discharge: 2013-11-10 | Disposition: A | Discharge status: Home / Self Care | Payer: 59 | Source: Ambulatory Visit | Attending: Hematology & Oncology | Admitting: Hematology & Oncology

## 2013-11-10 ENCOUNTER — Ambulatory Visit (HOSPITAL_BASED_OUTPATIENT_CLINIC_OR_DEPARTMENT_OTHER): Payer: 59 | Admitting: Hematology & Oncology

## 2013-11-10 VITALS — BP 111/66 | HR 62 | Temp 98.0°F | Resp 14 | Ht 67.0 in | Wt 192.0 lb

## 2013-11-10 DIAGNOSIS — D509 Iron deficiency anemia, unspecified: Secondary | ICD-10-CM

## 2013-11-10 DIAGNOSIS — M7989 Other specified soft tissue disorders: Secondary | ICD-10-CM

## 2013-11-10 LAB — CBC WITH DIFFERENTIAL (CANCER CENTER ONLY)
BASO#: 0 10*3/uL (ref 0.0–0.2)
Eosinophils Absolute: 0.1 10*3/uL (ref 0.0–0.5)
HGB: 11.7 g/dL (ref 11.6–15.9)
LYMPH#: 1.3 10*3/uL (ref 0.9–3.3)
MONO#: 0.4 10*3/uL (ref 0.1–0.9)
NEUT#: 3.6 10*3/uL (ref 1.5–6.5)
RBC: 4.24 10*6/uL (ref 3.70–5.32)
WBC: 5.4 10*3/uL (ref 3.9–10.0)

## 2013-11-10 LAB — CHCC SATELLITE - SMEAR

## 2013-11-10 LAB — IRON AND TIBC CHCC
TIBC: 230 ug/dL — ABNORMAL LOW (ref 236–444)
UIBC: 167 ug/dL (ref 120–384)

## 2013-11-11 ENCOUNTER — Telehealth: Payer: Self-pay | Admitting: Nurse Practitioner

## 2013-11-11 NOTE — Telephone Encounter (Addendum)
Message copied by Glee Arvin on Wed Nov 11, 2013 11:17 AM ------      Message from: Josph Macho      Created: Tue Nov 10, 2013  8:48 PM       Call - iron is much better. Pete ------Pt verbalized understanding and appreciation.

## 2013-11-11 NOTE — Progress Notes (Signed)
This office note has been dictated.

## 2013-11-15 ENCOUNTER — Emergency Department (HOSPITAL_COMMUNITY)
Admission: EM | Admit: 2013-11-15 | Discharge: 2013-11-15 | Disposition: A | Discharge status: Home / Self Care | Payer: 59 | Source: Home / Self Care | Attending: Family Medicine | Admitting: Family Medicine

## 2013-11-15 ENCOUNTER — Emergency Department (INDEPENDENT_AMBULATORY_CARE_PROVIDER_SITE_OTHER): Payer: 59

## 2013-11-15 ENCOUNTER — Encounter (HOSPITAL_COMMUNITY): Payer: Self-pay | Admitting: Emergency Medicine

## 2013-11-15 DIAGNOSIS — M659 Synovitis and tenosynovitis, unspecified: Secondary | ICD-10-CM

## 2013-11-15 DIAGNOSIS — M7750 Other enthesopathy of unspecified foot: Secondary | ICD-10-CM

## 2013-11-15 DIAGNOSIS — M65979 Unspecified synovitis and tenosynovitis, unspecified ankle and foot: Secondary | ICD-10-CM

## 2013-11-15 NOTE — ED Notes (Signed)
Pt c/o right foot pain and swelling x 10 days. Pt reports she was walking a lot recently on vacation and noticed more swelling and pain. Has been using ice and Aleeve with no relief of symptoms. Pt is alert and oriented.

## 2013-11-15 NOTE — ED Provider Notes (Signed)
CSN: [id number]     Arrival date & time 11/15/13  1237 History   First MD Initiated Contact with Patient 11/15/13 1354     Chief Complaint  Patient presents with  . Foot Pain   (Consider location/radiation/quality/duration/timing/severity/associated sxs/prior Treatment) Patient is a 37 y.o. female presenting with lower extremity pain. The history is provided by the patient.  Foot Pain This is a new problem. Episode onset: doing a lot of walking in [facility] 11 days ago and foot began to hurt, no specific injury. seen on tues by rheumatologist and given steroid pack, still finishing without relief, pain and swelling continue.    Past Medical History  Diagnosis Date  . Anemia   . Gastric ulcer   . Iron deficiency anemia     hx iron infusions with Dr. Myna Hidalgo  . Thyroid disease     hypothyroid   Past Surgical History  Procedure Laterality Date  . Right knee    . Eye surgery      lasik  . Breast surgery      augmentation  . Laparoscopic supracervical hysterectomy  07/10/2011    Procedure: LAPAROSCOPIC SUPRACERVICAL HYSTERECTOMY;  Surgeon: Melony Overly;  Location: [facility];  Service: Gynecology;  Laterality: N/A;  Laparoscopic Supracervical Hysterectomy & Cystoscopy  . Vestibuectomy  2003    Dr Edward Jolly  . Augmentation mammaplasty  2004   Family History  Problem Relation Age of Onset  . Rheum arthritis Mother   . Hypertension Mother   . Migraines Mother   . Hyperlipidemia Mother   . Thyroid disease Mother   . Thyroid disease Father   . Seizures Sister   . Hypertension Maternal Grandmother   . Hyperlipidemia Maternal Grandmother   . Stroke Maternal Grandmother   . Hypertension Paternal Grandmother   . Stroke Paternal Grandmother    History  Substance Use Topics  . Smoking status: Never Smoker   . Smokeless tobacco: Not on file  . Alcohol Use: No   OB History   Grav Para Term Preterm Abortions TAB SAB Ect Mult Living   3 2 2  1  1   2      Review of Systems    Constitutional: Negative.   Musculoskeletal: Positive for gait problem. Negative for joint swelling.  Skin: Negative.     Allergies  Codeine and Latex  Home Medications   Current Outpatient Rx  Name  Route  Sig  Dispense  Refill  . Calcium-Vitamin D-Vitamin K (CALCIUM SOFT CHEWS) 500-100-40 MG-UNT-MCG CHEW   Oral   Chew 1 tablet by mouth 2 (two) times daily.         . hydroxychloroquine (PLAQUENIL) 200 MG tablet   Oral   Take 200 mg by mouth 2 (two) times daily.         Marland Kitchen ibuprofen (ADVIL,MOTRIN) 200 MG tablet   Oral   Take 200 mg by mouth every 6 (six) hours as needed for pain.         Marland Kitchen levothyroxine (SYNTHROID, LEVOTHROID) 125 MCG tablet   Oral   Take 1 tablet (125 mcg total) by mouth daily.   30 tablet   11    BP 134/75  Pulse 68  Temp(Src) 99 F (37.2 C) (Oral)  Resp 18  SpO2 100%  LMP 06/22/2011 Physical Exam  Nursing note and vitals reviewed. Constitutional: She is oriented to person, place, and time.  Musculoskeletal: She exhibits edema and tenderness.       Feet:  Neurological: She  is alert and oriented to person, place, and time.  Skin: Skin is warm and dry.    ED Course  Procedures (including critical care time) Labs Review Labs Reviewed - No data to display Imaging Review Dg Foot Complete Right  11/15/2013   CLINICAL DATA:  Foot injury 9 days ago. Continued pain across the top of the foot.  EXAM: RIGHT FOOT COMPLETE - 3+ VIEW  COMPARISON:  None.  FINDINGS: No fracture. The joints are normally space and aligned. No bone lesion.  There is mild dorsal forefoot soft tissue swelling.  IMPRESSION: Mild dorsal forefoot soft tissue swelling. No fracture or joint abnormality.   Electronically Signed   By: Amie Portland M.D.   On: 11/15/2013 14:21    EKG Interpretation    Date/Time:    Ventricular Rate:    PR Interval:    QRS Duration:   QT Interval:    QTC Calculation:   R Axis:     Text Interpretation:              MDM       Linna Hoff, MD 11/16/13 2115

## 2013-11-21 NOTE — Progress Notes (Signed)
CC:   Kayla Record., M.D.  DIAGNOSIS:  Recurrent iron-deficiency anemia.  CURRENT THERAPY:  IV iron as indicated.  INTERIM HISTORY:  Kayla Salazar comes in for a followup.  We have not seen her for 10 years.  We gave her IV iron in the past.  This has always helped her.  Unfortunately, she became iron-deficient again.  She had iron studies checked back in September.  Her ferritin was 4 with iron saturation 6%.  She has had no bleeding outside of the monthly cycles.  She has had her thyroid checked.  This has been normal.  We went ahead and got her into the office.  We went ahead and gave her IV iron with Feraheme on the 18th of September.  She feels better.  She went out to [facility] for a conference couple of weeks ago.  Her legs are swollen now.  We did do Dopplers on the legs.  The Dopplers were negative for any type of thrombus.  She has complained of some pain in the top of the right foot.  I do not know if this might be a stress fracture or not.  I do not think we need any x-rays on this.  She has had no problems with weight loss or weight gain.  Her appetite has been okay.  She works as a Musician.  PHYSICAL EXAMINATION:  General:  This is a well-developed, well- nourished white female in no obvious distress.  Vital Signs: Temperature of 98, pulse 62, respiratory rate 18, blood pressure 111/66. Weight is 192 pounds.  Head and Neck:  Normocephalic, atraumatic skull. There are no ocular or oral lesions.  She has no scleral icterus.  There is no conjunctival pallor.  Lungs:  Clear bilaterally.  Cardiac: Regular rate and rhythm with a normal S1, S2.  There are no murmurs, rubs, or bruits.  Abdomen:  Soft.  She has good bowel sounds.  There is no fluid wave.  There is no palpable hepatosplenomegaly.  Extremities: No obvious clubbing, cyanosis or edema of the legs.  There may be some slight nonpitting edema of the legs.  She had no palpable venous cord  in the legs.  She has a slight tenderness to palpation over the distal aspect of the top of the right foot.  She has good pulses in the feet. Neurological:  No focal neurological deficits.  LABORATORY STUDIES:  White cell count is 5.4, hemoglobin 11.7, hematocrit 36.2, platelet count 190.  MCV is 85.  Her ferritin is 63 with an iron saturation of 27%.  IMPRESSION:  Kayla Salazar is a very nice 37 year old white female.  She has recurrent iron-deficiency anemia.  We gave her IV iron.  It certainly helped her out.  We will certainly get her back if necessary at her convenience.  She will let us know if she feels like she needs more iron again.  It has been 10 years since we last saw her, and she certainly has done well.    ______________________________ Josph Macho, M.D. PRE/MEDQ  D:  11/11/2013  T:  11/21/2013  Job:  [id number]

## 2013-12-01 ENCOUNTER — Ambulatory Visit: Payer: 59

## 2014-01-25 ENCOUNTER — Other Ambulatory Visit: Payer: Self-pay | Admitting: Otolaryngology

## 2014-01-25 DIAGNOSIS — R599 Enlarged lymph nodes, unspecified: Secondary | ICD-10-CM

## 2014-01-28 ENCOUNTER — Ambulatory Visit (HOSPITAL_COMMUNITY)
Admission: RE | Admit: 2014-01-28 | Discharge: 2014-01-28 | Disposition: A | Discharge status: Home / Self Care | Payer: 59 | Source: Ambulatory Visit | Attending: Otolaryngology | Admitting: Otolaryngology

## 2014-01-28 DIAGNOSIS — R599 Enlarged lymph nodes, unspecified: Secondary | ICD-10-CM | POA: Insufficient documentation

## 2014-01-28 MED ORDER — IOHEXOL 300 MG/ML  SOLN
100.0000 mL | Freq: Once | INTRAMUSCULAR | Status: AC | PRN
  Administered 2014-01-28: 100 mL via INTRAVENOUS

## 2014-02-12 ENCOUNTER — Other Ambulatory Visit (HOSPITAL_COMMUNITY): Payer: Self-pay | Admitting: Gastroenterology

## 2014-02-12 DIAGNOSIS — R1310 Dysphagia, unspecified: Secondary | ICD-10-CM

## 2014-02-16 ENCOUNTER — Ambulatory Visit (HOSPITAL_COMMUNITY)
Admission: RE | Admit: 2014-02-16 | Discharge: 2014-02-16 | Disposition: A | Discharge status: Home / Self Care | Payer: 59 | Source: Ambulatory Visit | Attending: Gastroenterology | Admitting: Gastroenterology

## 2014-02-16 DIAGNOSIS — R1310 Dysphagia, unspecified: Secondary | ICD-10-CM

## 2014-02-16 DIAGNOSIS — K219 Gastro-esophageal reflux disease without esophagitis: Secondary | ICD-10-CM | POA: Insufficient documentation

## 2014-05-03 ENCOUNTER — Encounter: Payer: Self-pay | Admitting: Obstetrics and Gynecology

## 2014-05-03 ENCOUNTER — Ambulatory Visit: Payer: 59 | Admitting: Obstetrics and Gynecology

## 2014-05-20 ENCOUNTER — Telehealth: Payer: Self-pay | Admitting: Obstetrics and Gynecology

## 2014-05-20 DIAGNOSIS — E039 Hypothyroidism, unspecified: Secondary | ICD-10-CM

## 2014-05-20 MED ORDER — LEVOTHYROXINE SODIUM 125 MCG PO TABS: 125.0000 ug | ORAL_TABLET | Freq: Every day | ORAL | Status: DC

## 2014-05-20 NOTE — Telephone Encounter (Signed)
Patient requesting refill for Synthroid. She is scheduled for her AEX 06/14/14 at 3:30 with Dr. Edward JollySilva. She will need this before the weekend starts.  [facility]

## 2014-05-20 NOTE — Telephone Encounter (Signed)
Last AEX 04/29/13 Last refill 04/30/13 #30/ 11 refills Next appt 06/14/14  Rx sent to pharmacy #30/0 refills - LM for pt re: Rx sent to pharmacy.

## 2014-06-14 ENCOUNTER — Telehealth: Payer: Self-pay | Admitting: Obstetrics and Gynecology

## 2014-06-14 ENCOUNTER — Ambulatory Visit (INDEPENDENT_AMBULATORY_CARE_PROVIDER_SITE_OTHER): Payer: 59 | Admitting: Obstetrics and Gynecology

## 2014-06-14 ENCOUNTER — Encounter: Payer: Self-pay | Admitting: Obstetrics and Gynecology

## 2014-06-14 VITALS — BP 120/80 | HR 80 | Ht 66.5 in | Wt 200.0 lb

## 2014-06-14 DIAGNOSIS — E039 Hypothyroidism, unspecified: Secondary | ICD-10-CM

## 2014-06-14 DIAGNOSIS — Z01419 Encounter for gynecological examination (general) (routine) without abnormal findings: Secondary | ICD-10-CM

## 2014-06-14 DIAGNOSIS — Z0000 Encounter for general adult medical examination without abnormal findings: Secondary | ICD-10-CM

## 2014-06-14 LAB — POCT URINALYSIS DIPSTICK
Bilirubin, UA: NEGATIVE
Blood, UA: NEGATIVE
Glucose, UA: NEGATIVE
Ketones, UA: NEGATIVE
Leukocytes, UA: NEGATIVE
Nitrite, UA: NEGATIVE
Protein, UA: NEGATIVE
Urobilinogen, UA: NEGATIVE
pH, UA: 5

## 2014-06-14 LAB — LIPID PANEL
Cholesterol: 170 mg/dL (ref 0–200)
HDL: 51 mg/dL (ref >39)
LDL Cholesterol: 98 mg/dL (ref 0–99)
Total CHOL/HDL Ratio: 3.3 Ratio
Triglycerides: 105 mg/dL (ref <150)
VLDL: 21 mg/dL (ref 0–40)

## 2014-06-14 MED ORDER — LEVOTHYROXINE SODIUM 125 MCG PO TABS: 125.0000 ug | ORAL_TABLET | Freq: Every day | ORAL | Status: DC

## 2014-06-14 NOTE — Telephone Encounter (Signed)
Pt does not fee like she should pay the dnka fee because she says she called and rescheduled the appointment.

## 2014-06-14 NOTE — Patient Instructions (Signed)

## 2014-06-14 NOTE — Progress Notes (Signed)
Patient ID: Kayla Salazar, female   DOB: Aug 05, 1976, 38 y.o.   MRN: [id number] GYNECOLOGY VISIT  PCP: None  Referring provider:   HPI: 38 y.o.   Married  Caucasian  female   [phone] with Patient's last menstrual period was 06/22/2011.   here for  AEX.    Has spotting once every three months.  Status post supracervical hysterectomy.   On Plaquinil and having some athletes foot.  Has had several injections of prednisone due to shoulder. Weight gain.   Hgb:    11.9 on 06-09-14 with Dr. Corliss Skainseveshwar Urine:  Neg  GYNECOLOGIC HISTORY: Patient's last menstrual period was 06/22/2011. Sexually active:  yes Partner preference: female Contraception:  Supracervical Hysterectomy Menopausal hormone therapy: none DES exposure:   no Blood transfusions:  no  Sexually transmitted diseases:  no  GYN procedures and prior surgeries: Breast augmentation, Supracervical hysterectomy,  Vestibuectomy 2003 Last mammogram:  n/a               Last pap and high risk HPV testing:  04-29-13 wnl:neg HR HPV.  History of abnormal pap smear:  no   OB History   Grav Para Term Preterm Abortions TAB SAB Ect Mult Living   3 2 2  1  1   2        LIFESTYLE: Exercise:  no             Tobacco:  no Alcohol:    no Drug use:  no  OTHER HEALTH MAINTENANCE: Tetanus/TDap:   Up to date through work Gardisil:              n/a Influenza:            09/2013 Zostavax:              n/a  Bone density:     n/a Colonoscopy:     2010 wnl:Dr. Signe [facility]  Cholesterol check: 03/2013 wnl  Family History  Problem Relation Age of Onset  . Rheum arthritis Mother   . Hypertension Mother   . Migraines Mother   . Hyperlipidemia Mother   . Thyroid disease Mother   . Thyroid disease Father   . Seizures Sister   . Hypertension Maternal Grandmother   . Hyperlipidemia Maternal Grandmother   . Stroke Maternal Grandmother   . Hypertension Paternal Grandmother   . Stroke Paternal Grandmother     Patient Active Problem  List   Diagnosis Date Noted  . OVERWEIGHT 07/07/2008  . ANEMIA-IRON DEFICIENCY 07/07/2008  . ANEMIA, SEVERE 07/07/2008  . GERD 07/07/2008  . PEPTIC ULCER DISEASE 07/07/2008  . IBS 07/07/2008  . BACK PAIN 07/07/2008  . MYALGIA 07/07/2008  . SLEEPINESS 07/07/2008  . PUD, HX OF 07/07/2008   Past Medical History  Diagnosis Date  . Anemia   . Gastric ulcer   . Iron deficiency anemia     hx iron infusions with Dr. Myna HidalgoEnnever  . Thyroid disease     hypothyroid  . Autoimmune disorder     Past Surgical History  Procedure Laterality Date  . Right knee    . Eye surgery      lasik  . Breast surgery      augmentation  . Laparoscopic supracervical hysterectomy  07/10/2011    Procedure: LAPAROSCOPIC SUPRACERVICAL HYSTERECTOMY;  Surgeon: Melony OverlyBrook A Silva;  Location: [facility];  Service: Gynecology;  Laterality: N/A;  Laparoscopic Supracervical Hysterectomy & Cystoscopy  . Vestibuectomy  2003    Dr Edward JollySilva  . Augmentation  mammaplasty  2004    ALLERGIES: Codeine and Latex  Current Outpatient Prescriptions  Medication Sig Dispense Refill  . Calcium-Vitamin D-Vitamin K (CALCIUM SOFT CHEWS) 500-100-40 MG-UNT-MCG CHEW Chew 1 tablet by mouth 2 (two) times daily.      . hydroxychloroquine (PLAQUENIL) 200 MG tablet Take 200 mg by mouth 2 (two) times daily.      Marland Kitchen. ibuprofen (ADVIL,MOTRIN) 200 MG tablet Take 200 mg by mouth every 6 (six) hours as needed for pain.      Marland Kitchen. levothyroxine (SYNTHROID, LEVOTHROID) 125 MCG tablet Take 1 tablet (125 mcg total) by mouth daily.  30 tablet  0  . omeprazole-sodium bicarbonate (ZEGERID) 40-1100 MG per capsule Take 1 capsule by mouth daily before breakfast.       No current facility-administered medications for this visit.     ROS:  Pertinent items are noted in HPI.  SOCIAL HISTORY:  Married. Works at [employer].   PHYSICAL EXAMINATION:    LMP 06/22/2011   Wt Readings from Last 3 Encounters:  11/10/13 192 lb (87.091 kg)  08/17/13 186 lb  (84.369 kg)  04/29/13 185 lb (83.915 kg)     Ht Readings from Last 3 Encounters:  11/10/13 5\' 7"  (1.702 m)  04/29/13 5\' 7"  (1.702 m)  07/10/11 5\' 8"  (1.727 m)    General appearance: alert, cooperative and appears stated age Head: Normocephalic, without obvious abnormality, atraumatic Neck: no adenopathy, supple, symmetrical, trachea midline and thyroid not enlarged, symmetric, no tenderness/mass/nodules Lungs: clear to auscultation bilaterally Breasts: Consistent with implants, No nipple retraction or dimpling, No nipple discharge or bleeding, No axillary or supraclavicular adenopathy, Normal to palpation without dominant masses Heart: regular rate and rhythm Abdomen: soft, non-tender; no masses,  no organomegaly Extremities: extremities normal, atraumatic, no cyanosis or edema Skin: Skin color, texture, turgor normal. No rashes or lesions Lymph nodes: Cervical, supraclavicular, and axillary nodes normal. No abnormal inguinal nodes palpated Neurologic: Grossly normal  Pelvic: External genitalia:  no lesions              Urethra:  normal appearing urethra with no masses, tenderness or lesions              Bartholins and Skenes: normal                 Vagina: normal appearing vagina with normal color and discharge, no lesions              Cervix: normal appearance              Pap and high risk HPV testing done: no.            Bimanual Exam:  Uterus:  Absent.                                      Adnexa: no masses                                      ASSESSMENT  Normal gynecologic exam. Status post supracervical hysterectomy.  Hypothyroidism.   PLAN  Pap smear and high risk HPV testing Counseled on self breast exam, Calcium and vitamin D intake, exercise, healthy diet.  Lipid profile, TFTs, Vit D. Refill of Synthroid 125 mcg for one year.   See Epic orders.  Return annually or prn  An After Visit Summary was printed and given to the patient.

## 2014-06-15 LAB — THYROID PANEL WITH TSH
FREE THYROXINE INDEX: 3.8 (ref 1.0–3.9)
T3 Uptake: 38.5 % — ABNORMAL HIGH (ref 22.5–37.0)
T4, Total: 9.8 ug/dL (ref 5.0–12.5)
TSH: 2.768 u[IU]/mL (ref 0.350–4.500)

## 2014-06-15 LAB — VITAMIN D 25 HYDROXY (VIT D DEFICIENCY, FRACTURES): Vit D, 25-Hydroxy: 35 ng/mL (ref 30–89)

## 2014-06-17 ENCOUNTER — Telehealth: Payer: Self-pay | Admitting: Obstetrics and Gynecology

## 2014-06-17 NOTE — Telephone Encounter (Signed)
Patient aware fee will not be waived.

## 2014-06-17 NOTE — Telephone Encounter (Signed)
LMTCB re: not waiving DNKA fee. °

## 2014-06-17 NOTE — Telephone Encounter (Signed)
Left a detailed message at number provided [phone]. Okay per ROI. Advised patient of message as seen below from Dr.Silva. Advised to call back with any further questions.  Entered by Jacqualin CombesBrook E Amundson de Carvalho E Sil* at 06/15/2014 7:28 AM Good morning Darl PikesSusan,   I am sharing your blood work with you.  It all looks normal.   I recommend you continue with your current dose of Synthroid.   Have a great summer!   Conley SimmondsBrook Silva, MD  Routing to provider for final review. Patient agreeable to disposition. Will close encounter

## 2014-06-17 NOTE — Telephone Encounter (Signed)
Patient calling for recent lab results.

## 2014-06-17 NOTE — Telephone Encounter (Signed)
It appears on EPIC the patient called 5/21 to reschedule appt which was after her scheduled appointment. Patient is responsible for no show fee.   Starla-please advise patient.   Thanks,   Sue LushAndrea

## 2014-07-06 ENCOUNTER — Encounter: Payer: Self-pay | Admitting: Certified Nurse Midwife

## 2014-07-06 ENCOUNTER — Ambulatory Visit (INDEPENDENT_AMBULATORY_CARE_PROVIDER_SITE_OTHER): Payer: 59 | Admitting: Certified Nurse Midwife

## 2014-07-06 VITALS — BP 108/72 | HR 68 | Resp 16 | Ht 66.5 in | Wt 201.0 lb

## 2014-07-06 DIAGNOSIS — B3731 Acute candidiasis of vulva and vagina: Secondary | ICD-10-CM

## 2014-07-06 DIAGNOSIS — B373 Candidiasis of vulva and vagina: Secondary | ICD-10-CM

## 2014-07-06 MED ORDER — FLUCONAZOLE 150 MG PO TABS: ORAL_TABLET | ORAL | Status: DC

## 2014-07-06 NOTE — Patient Instructions (Signed)

## 2014-07-06 NOTE — Progress Notes (Signed)
38 y.o. married white [id number] here with complaint of vaginal symptoms of itching, burning, and increase discharge. Describes discharge as thick, white and non odorous.. Onset of symptoms 3-4 days ago. Denies new personal products or vaginal dryness or spouse symptomatic. Patient does have a pool, but changes swim suits frequently. No STD concerns. Urinary symptoms none .   O:Healthy female WDWN Affect: normal, orientation x 3  Exam: Abdomen: non tender Lymph node: no enlargement or tenderness Pelvic exam: External genital: normal,no redness BUS: negative Vagina: white copious discharge noted. Ph:4.5  ,Wet prep taken Cervix: normal, non tender Uterus: absent Adnexa:normal, non tender, no masses or fullness noted   Wet Prep results: positive for yeast   A:Yeast vaginitis   P:Discussed findings of yeast vaginitis and etiology. Discussed Aveeno or baking soda sitz bath for comfort. Avoid moist clothes or pads for extended period of time. If working out in gym clothes or swim suits for long periods of time change underwear or bottoms of swimsuit if possible. Olive Oil use for skin protection prior to activity can be used to external skin. Rx: Diflucan see order  Rv prn

## 2014-07-09 NOTE — Progress Notes (Signed)
Reviewed personally.  M. Suzanne Mylee Falin, MD.  

## 2014-11-01 ENCOUNTER — Encounter: Payer: Self-pay | Admitting: Certified Nurse Midwife

## 2015-06-23 ENCOUNTER — Other Ambulatory Visit: Payer: Self-pay | Admitting: Obstetrics and Gynecology

## 2015-06-23 NOTE — Telephone Encounter (Signed)
Medication refill request: Synthroid 125 mcg Last AEX:  06/14/14 with BS Next AEX: 09/01/15 with BS Last TSH level checked: 06/14/14 at 38.5 Refill authorized: #[id number] rfs  Called and scheduled patient for AEX 09/01/15 with Dr. Edward Jolly patient is needing a refill, please advise.   Marland Kitchen

## 2015-06-24 NOTE — Telephone Encounter (Signed)
Message left letting pt know rx has been sent to her pharmacy

## 2015-07-28 ENCOUNTER — Telehealth: Payer: Self-pay | Admitting: *Deleted

## 2015-07-28 NOTE — Telephone Encounter (Signed)
Called patient and scheduled her for OV tomorrow at 8:15 with Dr. Edward Jolly.

## 2015-07-28 NOTE — Telephone Encounter (Signed)
I would like to help the patient, but I would need to see her for an office visit to prescribe an antibiotic.

## 2015-07-28 NOTE — Telephone Encounter (Signed)
Great!. Encounter closed.

## 2015-07-28 NOTE — Telephone Encounter (Signed)
Patient has a upper respiratory infection and is wondering if Dr. Edward Jolly could write her a rx for antibiotic because she cannot get in with her PCP for about 2 weeks. Advised patient that that isn't something that we treat here since we are primarily GYN and that she could go to an urgent care. Patient said she still wants to see if she Dr. Edward Jolly would write one anyway.  Dr. Edward Jolly please advise.

## 2015-07-29 ENCOUNTER — Encounter: Payer: Self-pay | Admitting: Obstetrics and Gynecology

## 2015-07-29 ENCOUNTER — Ambulatory Visit (INDEPENDENT_AMBULATORY_CARE_PROVIDER_SITE_OTHER): Payer: 59 | Admitting: Obstetrics and Gynecology

## 2015-07-29 VITALS — BP 120/82 | HR 70 | Ht 66.5 in | Wt 182.8 lb

## 2015-07-29 DIAGNOSIS — J069 Acute upper respiratory infection, unspecified: Secondary | ICD-10-CM

## 2015-07-29 MED ORDER — AZITHROMYCIN 250 MG PO TABS: ORAL_TABLET | ORAL | Status: DC

## 2015-07-29 MED ORDER — BENZONATATE 200 MG PO CAPS: 200.0000 mg | ORAL_CAPSULE | Freq: Three times a day (TID) | ORAL | Status: DC | PRN

## 2015-07-29 MED ORDER — FLUCONAZOLE 150 MG PO TABS: 150.0000 mg | ORAL_TABLET | Freq: Once | ORAL | Status: DC

## 2015-07-29 NOTE — Progress Notes (Signed)
Patient ID: Kayla Salazar, female   DOB: [birth year], 39 y.o.   MRN: [id number] GYNECOLOGY  VISIT   HPI: 39 y.o.   Married  Caucasian  female   [phone] with Patient's last menstrual period was 06/22/2011.   here for chest congestion and cough for 5 days.   Symptoms started 4 days ago.  Productive cough. Not sleeping well at night due to cough.  Chest feels tight.  No fever.  Some sore throat form post nasal drip.   Unable to see PCP.   GYNECOLOGIC HISTORY: Patient's last menstrual period was 06/22/2011. Contraception:Hysterectomy--supracervical Menopausal hormone therapy: n/a Last mammogram: n/a Last pap smear: 04-29-13 Neg/Neg HR HPV        OB History    Gravida Para Term Preterm AB TAB SAB Ectopic Multiple Living   3 2 2  1  1   2          Patient Active Problem List   Diagnosis Date Noted  . OVERWEIGHT 07/07/2008  . ANEMIA-IRON DEFICIENCY 07/07/2008  . ANEMIA, SEVERE 07/07/2008  . GERD 07/07/2008  . PEPTIC ULCER DISEASE 07/07/2008  . IBS 07/07/2008  . BACK PAIN 07/07/2008  . MYALGIA 07/07/2008  . SLEEPINESS 07/07/2008  . PUD, HX OF 07/07/2008    Past Medical History  Diagnosis Date  . Anemia   . Gastric ulcer   . Iron deficiency anemia     hx iron infusions with Dr. Myna Hidalgo  . Thyroid disease     hypothyroid  . Autoimmune disorder     Past Surgical History  Procedure Laterality Date  . Right knee    . Eye surgery      lasik  . Breast surgery      augmentation  . Laparoscopic supracervical hysterectomy  07/10/2011    Procedure: LAPAROSCOPIC SUPRACERVICAL HYSTERECTOMY;  Surgeon: Melony Overly;  Location: [facility];  Service: Gynecology;  Laterality: N/A;  Laparoscopic Supracervical Hysterectomy & Cystoscopy  . Vestibuectomy  2003    Dr Edward Jolly  . Augmentation mammaplasty  2004    Current Outpatient Prescriptions  Medication Sig Dispense Refill  . Calcium-Vitamin D-Vitamin K (CALCIUM SOFT CHEWS) 500-100-40 MG-UNT-MCG CHEW Chew 1 tablet by mouth 2 (two) times  daily.    . hydroxychloroquine (PLAQUENIL) 200 MG tablet Take 200 mg by mouth 2 (two) times daily.    Marland Kitchen ibuprofen (ADVIL,MOTRIN) 200 MG tablet Take 200 mg by mouth every 6 (six) hours as needed for pain.    Marland Kitchen omeprazole-sodium bicarbonate (ZEGERID) 40-1100 MG per capsule Take 1 capsule by mouth daily before breakfast.    . SYNTHROID 125 MCG tablet TAKE 1 TABLET BY MOUTH DAILY. 90 tablet 0   No current facility-administered medications for this visit.     ALLERGIES: Codeine and Latex  Family History  Problem Relation Age of Onset  . Rheum arthritis Mother   . Hypertension Mother   . Migraines Mother   . Hyperlipidemia Mother   . Thyroid disease Mother   . Thyroid disease Father   . Seizures Sister   . Hypertension Maternal Grandmother   . Hyperlipidemia Maternal Grandmother   . Stroke Maternal Grandmother   . Hypertension Paternal Grandmother   . Stroke Paternal Grandmother     History   Social History  . Marital Status: Married    Spouse Name: N/A  . Number of Children: N/A  . Years of Education: N/A   Occupational History  . Not on file.   Social History Main Topics  . Smoking status:  Never Smoker   . Smokeless tobacco: Not on file  . Alcohol Use: No  . Drug Use: No  . Sexual Activity:    Partners: Male    Birth Control/ Protection: Surgical     Comment: supracervic hyst 2012   Other Topics Concern  . Not on file   Social History Narrative    ROS:  Pertinent items are noted in HPI.  PHYSICAL EXAMINATION:    BP 120/82 mmHg  Pulse 70  Ht 5' 6.5" (1.689 m)  Wt 182 lb 12.8 oz (82.918 kg)  BMI 29.07 kg/m2  LMP 06/22/2011    General appearance: alert, cooperative and appears stated age Head: Normocephalic, without obvious abnormality, atraumatic.  Throat - no erythema or enlarged tonsils. Neck: no adenopathy, supple, symmetrical, trachea midline and thyroid no masses. Lungs: clear to auscultation bilaterally. No rales or rhonchi. Cor:  S1S2 RRR.     ASSESSMENT  URI.  Afebrile.   PLAN  Zpack.  See orders. Tessalon Pearls.  See orders. Diflucan 150 mg for YI if occurs. To urgent care if worse during the weekend.  No work for 48 hours if becomes febrile.   An After Visit Summary was printed and given to the patient.  _15_____ minutes face to face time of which over 50% was spent in counseling.

## 2015-07-29 NOTE — Patient Instructions (Signed)
Please go to urgent care if you feel worse during the weekend.

## 2015-09-01 ENCOUNTER — Ambulatory Visit (INDEPENDENT_AMBULATORY_CARE_PROVIDER_SITE_OTHER): Payer: 59 | Admitting: Obstetrics and Gynecology

## 2015-09-01 ENCOUNTER — Encounter: Payer: Self-pay | Admitting: Obstetrics and Gynecology

## 2015-09-01 ENCOUNTER — Other Ambulatory Visit: Payer: Self-pay

## 2015-09-01 VITALS — BP 122/78 | HR 60 | Resp 12 | Ht 67.0 in | Wt 185.0 lb

## 2015-09-01 DIAGNOSIS — Z0000 Encounter for general adult medical examination without abnormal findings: Secondary | ICD-10-CM | POA: Diagnosis not present

## 2015-09-01 DIAGNOSIS — Z1231 Encounter for screening mammogram for malignant neoplasm of breast: Secondary | ICD-10-CM

## 2015-09-01 DIAGNOSIS — Z01419 Encounter for gynecological examination (general) (routine) without abnormal findings: Secondary | ICD-10-CM

## 2015-09-01 DIAGNOSIS — E039 Hypothyroidism, unspecified: Secondary | ICD-10-CM | POA: Diagnosis not present

## 2015-09-01 LAB — POCT URINALYSIS DIPSTICK
Bilirubin, UA: NEGATIVE
Blood, UA: NEGATIVE
Glucose, UA: NEGATIVE
Ketones, UA: NEGATIVE
LEUKOCYTES UA: NEGATIVE
NITRITE UA: NEGATIVE
PH UA: 5
Protein, UA: NEGATIVE
Urobilinogen, UA: NEGATIVE

## 2015-09-01 LAB — LIPID PANEL
Cholesterol: 171 mg/dL (ref 125–200)
HDL: 43 mg/dL — ABNORMAL LOW (ref >=46)
LDL CALC: 113 mg/dL (ref <130)
Total CHOL/HDL Ratio: 4 Ratio (ref <=5.0)
Triglycerides: 74 mg/dL (ref <150)
VLDL: 15 mg/dL (ref <30)

## 2015-09-01 LAB — THYROID PANEL WITH TSH
Free Thyroxine Index: 2.9 (ref 1.4–3.8)
T3 UPTAKE: 32 % (ref 22–35)
T4, Total: 9 ug/dL (ref 4.5–12.0)
TSH: 3.556 u[IU]/mL (ref 0.350–4.500)

## 2015-09-01 LAB — COMPREHENSIVE METABOLIC PANEL
ALBUMIN: 4.1 g/dL (ref 3.6–5.1)
ALT: 12 U/L (ref 6–29)
AST: 16 U/L (ref 10–30)
Alkaline Phosphatase: 70 U/L (ref 33–115)
BILIRUBIN TOTAL: 0.4 mg/dL (ref 0.2–1.2)
BUN: 9 mg/dL (ref 7–25)
CO2: 28 mmol/L (ref 20–31)
CREATININE: 0.65 mg/dL (ref 0.50–1.10)
Calcium: 9.1 mg/dL (ref 8.6–10.2)
Chloride: 103 mmol/L (ref 98–110)
Glucose, Bld: 88 mg/dL (ref 65–99)
Potassium: 4.1 mmol/L (ref 3.5–5.3)
SODIUM: 141 mmol/L (ref 135–146)
Total Protein: 6.9 g/dL (ref 6.1–8.1)

## 2015-09-01 LAB — CBC
HCT: 37.1 % (ref 36.0–46.0)
Hemoglobin: 12.1 g/dL (ref 12.0–15.0)
MCH: 28.5 pg (ref 26.0–34.0)
MCHC: 32.6 g/dL (ref 30.0–36.0)
MCV: 87.3 fL (ref 78.0–100.0)
MPV: 10.6 fL (ref 8.6–12.4)
Platelets: 251 10*3/uL (ref 150–400)
RBC: 4.25 MIL/uL (ref 3.87–5.11)
RDW: 13.3 % (ref 11.5–15.5)
WBC: 7.4 10*3/uL (ref 4.0–10.5)

## 2015-09-01 MED ORDER — SYNTHROID 125 MCG PO TABS: 125.0000 ug | ORAL_TABLET | Freq: Every day | ORAL | Status: DC

## 2015-09-01 NOTE — Progress Notes (Signed)
Patient ID: Kayla Salazar, female   DOB: July 11, 1976, 39 y.o.   MRN: [id number] 39 y.o. G30P2012 Married Caucasian female here for annual exam.    Some heaviness of right breast. Has implants. No masses.   No vaginal spotting.   Kids doing well.  Work is good.  Going to beach for husband's 43rd birthday.   PCP:  None   Patient's last menstrual period was 06/22/2011.          Sexually active: Yes.   female The current method of family planning is status post hysterectomy.--Supracervical    Exercising: No.   Smoker:  no  Health Maintenance: Pap:  04-02-13 Neg:Neg HR HPV History of abnormal Pap:  no MMG:  Never Colonoscopy:  2010 normal with Dr. Vic Ripper to rectal bleeding and anemia--diagnosed with hemorrhoids/ulcers) BMD:   n/a  Result  n/a TDaP:  Up to date through [facility] Screening Labs:  Hb today: --- Urine today: Neg   reports that she has never smoked. She does not have any smokeless tobacco history on file. She reports that she does not drink alcohol or use illicit drugs.  Past Medical History  Diagnosis Date  . Anemia   . Gastric ulcer   . Iron deficiency anemia     hx iron infusions with Dr. Myna Hidalgo  . Thyroid disease     hypothyroid  . Autoimmune disorder     Past Surgical History  Procedure Laterality Date  . Right knee    . Eye surgery      lasik  . Breast surgery      augmentation  . Laparoscopic supracervical hysterectomy  07/10/2011    Procedure: LAPAROSCOPIC SUPRACERVICAL HYSTERECTOMY;  Surgeon: Melony Overly;  Location: [facility];  Service: Gynecology;  Laterality: N/A;  Laparoscopic Supracervical Hysterectomy & Cystoscopy  . Vestibuectomy  2003    Dr Edward Jolly  . Augmentation mammaplasty  2004    Current Outpatient Prescriptions  Medication Sig Dispense Refill  . Calcium-Vitamin D-Vitamin K (CALCIUM SOFT CHEWS) 500-100-40 MG-UNT-MCG CHEW Chew 1 tablet by mouth 2 (two) times daily.    . hydroxychloroquine (PLAQUENIL) 200 MG tablet Take 200 mg by mouth 2 (two)  times daily.    Marland Kitchen ibuprofen (ADVIL,MOTRIN) 200 MG tablet Take 200 mg by mouth every 6 (six) hours as needed for pain.    Marland Kitchen omeprazole-sodium bicarbonate (ZEGERID) 40-1100 MG per capsule Take 1 capsule by mouth daily before breakfast.    . SYNTHROID 125 MCG tablet TAKE 1 TABLET BY MOUTH DAILY. 90 tablet 0   No current facility-administered medications for this visit.    Family History  Problem Relation Age of Onset  . Rheum arthritis Mother   . Hypertension Mother   . Migraines Mother   . Hyperlipidemia Mother   . Thyroid disease Mother   . Thyroid disease Father   . Seizures Sister   . Hypertension Maternal Grandmother   . Hyperlipidemia Maternal Grandmother   . Stroke Maternal Grandmother   . Hypertension Paternal Grandmother   . Stroke Paternal Grandmother     ROS:  Pertinent items are noted in HPI.  Otherwise, a comprehensive ROS was negative.  Exam:   BP 122/78 mmHg  Pulse 60  Resp 12  Ht 5\' 7"  (1.702 m)  Wt 185 lb (83.915 kg)  BMI 28.97 kg/m2  LMP 06/22/2011    General appearance: alert, cooperative and appears stated age Head: Normocephalic, without obvious abnormality, atraumatic Neck: no adenopathy, supple, symmetrical, trachea midline and thyroid normal to  inspection and palpation Lungs: clear to auscultation bilaterally Breasts: normal appearance, no masses or tenderness, Inspection negative, No nipple retraction or dimpling, No nipple discharge or bleeding, No axillary or supraclavicular adenopathy.  Consistent with implants. Heart: regular rate and rhythm Abdomen: soft, non-tender; bowel sounds normal; no masses,  no organomegaly Extremities: extremities normal, atraumatic, no cyanosis or edema Skin: Skin color, texture, turgor normal. No rashes or lesions Lymph nodes: Cervical, supraclavicular, and axillary nodes normal. No abnormal inguinal nodes palpated Neurologic: Grossly normal  Pelvic: External genitalia:  no lesions              Urethra:  normal  appearing urethra with no masses, tenderness or lesions              Bartholins and Skenes: normal                 Vagina: normal appearing vagina with normal color and discharge, no lesions              Cervix: no lesions              Pap taken: No. Bimanual Exam:  Uterus:  uterus absent              Adnexa: normal adnexa and no mass, fullness, tenderness              Rectovaginal: Yes.  .  Confirms.              Anus:  normal sphincter tone, no lesions  Chaperone was present for exam.  Assessment:   Well woman visit with normal exam. Status post supracervical hysterectomy.  Hypothyroidism.  Plan: Yearly mammogram recommended after age 39.   Patient will schedule at [facility].  Recommended self breast exam.  Pap and HR HPV as above. Discussed Calcium, Vitamin D, regular exercise program including cardiovascular and weight bearing exercise. Labs performed.  Yes.  .   See orders.  Routine labs and TFTs. Refills given on medications.  Yes.  .  See orders.  Synthroid Rx for one year. Follow up annually and prn.     After visit summary provided.

## 2015-09-01 NOTE — Patient Instructions (Signed)

## 2015-09-02 ENCOUNTER — Telehealth: Payer: Self-pay | Admitting: Obstetrics and Gynecology

## 2015-09-02 NOTE — Telephone Encounter (Signed)
Patient says she is returning a call, not sure who called her. No open tc note. Patient thinks it is about her results.

## 2015-09-02 NOTE — Telephone Encounter (Signed)
Spoke with patient. Results given as seen below. Patient is agreeable and verbalizes understanding.  Notes Recorded by Patton Salles, MD on 09/01/2015 at 8:12 PM Results to patient through My Chart. Low HDL cholesterol, otherwise normal labs.  Routing to provider for final review. Patient agreeable to disposition. Will close encounter.

## 2015-10-12 ENCOUNTER — Ambulatory Visit
Admission: RE | Admit: 2015-10-12 | Discharge: 2015-10-12 | Disposition: A | Discharge status: Home / Self Care | Payer: 59 | Source: Ambulatory Visit

## 2015-10-12 DIAGNOSIS — Z1231 Encounter for screening mammogram for malignant neoplasm of breast: Secondary | ICD-10-CM

## 2015-10-19 ENCOUNTER — Telehealth: Payer: Self-pay | Admitting: Obstetrics and Gynecology

## 2015-10-19 NOTE — Telephone Encounter (Signed)
Patient is calling to get her mammogram results.

## 2015-10-19 NOTE — Telephone Encounter (Signed)
CLINICAL DATA: Screening. Baseline study.  EXAM: DIGITAL SCREENING BILATERAL MAMMOGRAM WITH IMPLANTS, 3D TOMO WITH CAD  COMPARISON: None.  ACR Breast Density Category c: The breast tissue is heterogeneously dense, which may obscure small masses.  FINDINGS: The patient has a subpectoral saline implants which obscure a portion of the breast parenchymal volumes. Standard and implant displaced views were performed. There are no findings suspicious for malignancy. Images were processed with CAD.  IMPRESSION: No mammographic evidence of malignancy. A result letter of this screening mammogram will be mailed directly to the patient.  RECOMMENDATION: Screening mammogram in one year. (Code:SM-B-01Y)  BI-RADS CATEGORY 1: Negative.   Electronically Signed  By: Raymondo BandAndrew Crane  On: 10/13/2015 10:24

## 2015-10-19 NOTE — Telephone Encounter (Signed)
Spoke with patient. Advised of mammogram results as seen below. Aware she will need a screening mammogram in one year. Patient is agreeable and verbalizes understanding.  Routing to provider for final review. Patient agreeable to disposition. Will close encounter.

## 2016-01-25 ENCOUNTER — Ambulatory Visit (INDEPENDENT_AMBULATORY_CARE_PROVIDER_SITE_OTHER): Payer: 59 | Admitting: Obstetrics and Gynecology

## 2016-01-25 ENCOUNTER — Encounter: Payer: Self-pay | Admitting: Obstetrics and Gynecology

## 2016-01-25 VITALS — BP 102/70 | HR 64 | Resp 15 | Wt 188.0 lb

## 2016-01-25 DIAGNOSIS — B373 Candidiasis of vulva and vagina: Secondary | ICD-10-CM | POA: Diagnosis not present

## 2016-01-25 DIAGNOSIS — B3731 Acute candidiasis of vulva and vagina: Secondary | ICD-10-CM

## 2016-01-25 MED ORDER — FLUCONAZOLE 150 MG PO TABS: 150.0000 mg | ORAL_TABLET | Freq: Once | ORAL | Status: DC

## 2016-01-25 MED FILL — FLUCONAZOLE 150 MG TABLET: 150 | 2 days supply | Qty: 2 | Fill #0

## 2016-01-25 NOTE — Progress Notes (Signed)
Patient ID: Kayla Salazar, female   DOB: Jun 08, 1976, 40 y.o.   MRN: [id number] GYNECOLOGY  VISIT   HPI: 40 y.o.   Married  Caucasian  female   [phone] with Patient's last menstrual period was 06/22/2011.   here c/o vaginal itching x 2 days  Has vaginal itching.  No discharge.  No odor.  OTC meds cause burning.  Diflucan usually works well.   No recent abx.   Had a short course of steroids for a mouth sore.  Sore resolved.   GYNECOLOGIC HISTORY: Patient's last menstrual period was 06/22/2011. Contraception:hysterectomy Menopausal hormone therapy: None Last mammogram: 10-12-15 WNL Cat. C BI Rads Last pap smear: 04-29-13 NEG         OB History    Gravida Para Term Preterm AB TAB SAB Ectopic Multiple Living   Patient Active Problem List   Diagnosis Date Noted  . OVERWEIGHT 07/07/2008  . ANEMIA-IRON DEFICIENCY 07/07/2008  . ANEMIA, SEVERE 07/07/2008  . GERD 07/07/2008  . PEPTIC ULCER DISEASE 07/07/2008  . IBS 07/07/2008  . BACK PAIN 07/07/2008  . MYALGIA 07/07/2008  . SLEEPINESS 07/07/2008  . PUD, HX OF 07/07/2008    Past Medical History  Diagnosis Date  . Anemia   . Gastric ulcer   . Iron deficiency anemia     hx iron infusions with Dr. Myna Hidalgo  . Thyroid disease     hypothyroid  . Autoimmune disorder [facility])     Past Surgical History  Procedure Laterality Date  . Right knee    . Eye surgery      lasik  . Breast surgery      augmentation  . Laparoscopic supracervical hysterectomy  07/10/2011    Procedure: LAPAROSCOPIC SUPRACERVICAL HYSTERECTOMY;  Surgeon: Melony Overly;  Location: [facility];  Service: Gynecology;  Laterality: N/A;  Laparoscopic Supracervical Hysterectomy & Cystoscopy  . Vestibuectomy  2003    Dr Edward Jolly  . Augmentation mammaplasty  2004    Current Outpatient Prescriptions  Medication Sig Dispense Refill  . Calcium-Vitamin D-Vitamin K (CALCIUM SOFT CHEWS) 500-100-40 MG-UNT-MCG CHEW Chew 1 tablet by mouth 2 (two) times  daily.    . hydroxychloroquine (PLAQUENIL) 200 MG tablet Take 200 mg by mouth 2 (two) times daily.    Marland Kitchen ibuprofen (ADVIL,MOTRIN) 200 MG tablet Take 200 mg by mouth every 6 (six) hours as needed for pain.    Marland Kitchen omeprazole-sodium bicarbonate (ZEGERID) 40-1100 MG per capsule Take 1 capsule by mouth daily before breakfast.    . SYNTHROID 125 MCG tablet Take 1 tablet (125 mcg total) by mouth daily. 90 tablet 3   No current facility-administered medications for this visit.     ALLERGIES: Codeine and Latex  Family History  Problem Relation Age of Onset  . Rheum arthritis Mother   . Hypertension Mother   . Migraines Mother   . Hyperlipidemia Mother   . Thyroid disease Mother   . Thyroid disease Father   . Seizures Sister   . Hypertension Maternal Grandmother   . Hyperlipidemia Maternal Grandmother   . Stroke Maternal Grandmother   . Hypertension Paternal Grandmother   . Stroke Paternal Grandmother     Social History   Social History  . Marital Status: Married    Spouse Name: N/A  . Number of Children: N/A  . Years of Education: N/A   Occupational History  . Not on file.  Social History Main Topics  . Smoking status: Never Smoker   . Smokeless tobacco: Not on file  . Alcohol Use: No  . Drug Use: No  . Sexual Activity:    Partners: Male    Birth Control/ Protection: Surgical     Comment: supracervic hyst 2012   Other Topics Concern  . Not on file   Social History Narrative    ROS:  Pertinent items are noted in HPI.  PHYSICAL EXAMINATION:    BP 102/70 mmHg  Pulse 64  Resp 15  Wt 188 lb (85.276 kg)  LMP 06/22/2011    General appearance: alert, cooperative and appears stated age    Pelvic: External genitalia:  Generalized erythema of the vulva.              Urethra:  normal appearing urethra with no masses, tenderness or lesions              Bartholins and Skenes: normal                 Vagina: normal appearing vagina with yellowish, green clumpy discharge.               Cervix: no lesions               Bimanual Exam:  Uterus:  normal size, contour, position, consistency, mobility, non-tender              Adnexa: normal adnexa and no mass, fullness, tenderness          Wet prep - pH 4.5, positive hyphae, negative trichomonas and negative clue cells.  Chaperone was present for exam.  ASSESSMENT  Yeast vaginitis.   PLAN  Counseled regarding yeast vaginitis and vaginitis in general.  Diflucan 150 mg po.  #2, RF none. Discussed probiotics.  Avoid restrictive or moist clothing.  Call for persistent symptoms.   An After Visit Summary was printed and given to the patient.  __15____ minutes face to face time of which over 50% was spent in counseling.

## 2016-02-21 DIAGNOSIS — H52221 Regular astigmatism, right eye: Secondary | ICD-10-CM | POA: Diagnosis not present

## 2016-03-26 MED FILL — SYNTHROID 125 MCG TABLET: 125 | 90 days supply | Qty: 90 | Fill #2

## 2016-07-06 MED FILL — SYNTHROID 125 MCG TABLET: 125 | 90 days supply | Qty: 90 | Fill #3

## 2016-09-12 NOTE — Progress Notes (Deleted)
40 y.o. G93P2012 Married Caucasian female here for annual exam.    PCP:     Patient's last menstrual period was 06/22/2011.           Sexually active: {yes no:314532}  The current method of family planning is {contraception:315051}.    Exercising: {yes no:314532}  {types:19826} Smoker:  {YES NO:22349}  Health Maintenance: Pap:  04-29-13 Neg:Neg HR HPV History of abnormal Pap:  no MMG:  10-12-15 Saline Implants/3D/Neg/BiRads1:[facility] Colonoscopy:  *** BMD:   ***  Result  *** TDaP:  *** Gardasil:   N/A HIV: Hep C: Screening Labs:  Hb today: ***, Urine today: ***   reports that she has never smoked. She does not have any smokeless tobacco history on file. She reports that she does not drink alcohol or use drugs.  Past Medical History:  Diagnosis Date  . Anemia   . Autoimmune disorder (HCC)   . Gastric ulcer   . Iron deficiency anemia    hx iron infusions with Dr. Myna Hidalgo  . Thyroid disease    hypothyroid    Past Surgical History:  Procedure Laterality Date  . AUGMENTATION MAMMAPLASTY  2004  . BREAST SURGERY     augmentation  . EYE SURGERY     lasik  . LAPAROSCOPIC SUPRACERVICAL HYSTERECTOMY  07/10/2011   Procedure: LAPAROSCOPIC SUPRACERVICAL HYSTERECTOMY;  Surgeon: Melony Overly;  Location: [facility];  Service: Gynecology;  Laterality: N/A;  Laparoscopic Supracervical Hysterectomy & Cystoscopy  . right knee    . vestibuectomy  2003   Dr Edward Jolly    Current Outpatient Prescriptions  Medication Sig Dispense Refill  . Calcium-Vitamin D-Vitamin K (CALCIUM SOFT CHEWS) 500-100-40 MG-UNT-MCG CHEW Chew 1 tablet by mouth 2 (two) times daily.    . fluconazole (DIFLUCAN) 150 MG tablet Take 1 tablet (150 mg total) by mouth once. Take one tablet.  Repeat in 48 hours if symptoms are not completely resolved. 2 tablet 0  . hydroxychloroquine (PLAQUENIL) 200 MG tablet Take 200 mg by mouth 2 (two) times daily.    Marland Kitchen ibuprofen (ADVIL,MOTRIN) 200 MG tablet Take 200 mg by mouth every  6 (six) hours as needed for pain.    Marland Kitchen omeprazole-sodium bicarbonate (ZEGERID) 40-1100 MG per capsule Take 1 capsule by mouth daily before breakfast.    . SYNTHROID 125 MCG tablet Take 1 tablet (125 mcg total) by mouth daily. 90 tablet 3   No current facility-administered medications for this visit.     Family History  Problem Relation Age of Onset  . Rheum arthritis Mother   . Hypertension Mother   . Migraines Mother   . Hyperlipidemia Mother   . Thyroid disease Mother   . Thyroid disease Father   . Seizures Sister   . Hypertension Maternal Grandmother   . Hyperlipidemia Maternal Grandmother   . Stroke Maternal Grandmother   . Hypertension Paternal Grandmother   . Stroke Paternal Grandmother     ROS:  Pertinent items are noted in HPI.  Otherwise, a comprehensive ROS was negative.  Exam:   LMP 06/22/2011     General appearance: alert, cooperative and appears stated age Head: Normocephalic, without obvious abnormality, atraumatic Neck: no adenopathy, supple, symmetrical, trachea midline and thyroid normal to inspection and palpation Lungs: clear to auscultation bilaterally Breasts: normal appearance, no masses or tenderness, No nipple retraction or dimpling, No nipple discharge or bleeding, No axillary or supraclavicular adenopathy Heart: regular rate and rhythm Abdomen: soft, non-tender; no masses, no organomegaly Extremities: extremities normal, atraumatic, no  cyanosis or edema Skin: Skin color, texture, turgor normal. No rashes or lesions Lymph nodes: Cervical, supraclavicular, and axillary nodes normal. No abnormal inguinal nodes palpated Neurologic: Grossly normal  Pelvic: External genitalia:  no lesions              Urethra:  normal appearing urethra with no masses, tenderness or lesions              Bartholins and Skenes: normal                 Vagina: normal appearing vagina with normal color and discharge, no lesions              Cervix: no lesions               Pap taken: {yes no:314532} Bimanual Exam:  Uterus:  normal size, contour, position, consistency, mobility, non-tender              Adnexa: no mass, fullness, tenderness              Rectal exam: {yes no:314532}.  Confirms.              Anus:  normal sphincter tone, no lesions  Chaperone was present for exam.  Assessment:   Well woman visit with normal exam.   Plan: Yearly mammogram recommended after age 40.  Recommended self breast exam.  Pap and HR HPV as above. Discussed Calcium, Vitamin D, regular exercise program including cardiovascular and weight bearing exercise.   Follow up annually and prn.   Additional counseling given.  {yes [id number]:314532}. _______ minutes face to face time of which over 50% was spent in counseling.    After visit summary provided.

## 2016-09-13 ENCOUNTER — Ambulatory Visit: Payer: 59 | Admitting: Obstetrics and Gynecology

## 2016-09-13 ENCOUNTER — Encounter: Payer: Self-pay | Admitting: Obstetrics and Gynecology

## 2016-10-09 ENCOUNTER — Other Ambulatory Visit: Payer: Self-pay | Admitting: Obstetrics and Gynecology

## 2016-10-09 MED FILL — SYNTHROID 125 MCG TABLET: 125 | 30 days supply | Qty: 30 | Fill #0

## 2016-10-09 NOTE — Telephone Encounter (Signed)
Medication refill request: Synthroid 125mcg Last AEX:  09/01/15 BS Next AEX: patient No Showed for appt 09/13/16 - letter sent to patient Last MMG (if hormonal medication request): 10/12/15 BIRADS1 negative Refill authorized: 09/01/15 #90 w/3 refills; today please advise

## 2016-10-09 NOTE — Telephone Encounter (Signed)
Please contact the patient to schedule annual exam with me.  I will refill one month.

## 2016-10-10 NOTE — Telephone Encounter (Signed)
Patient called back and scheduled her AEX with Dr. Edward JollySilva on 10/15/16.

## 2016-10-10 NOTE — Telephone Encounter (Signed)
Patient called to check on the status of her refill request after hours. I called her back this morning and left her a message to call back.   The patient also requested a prescription for lab work to have done at her work place.

## 2016-10-15 ENCOUNTER — Encounter: Payer: Self-pay | Admitting: Obstetrics and Gynecology

## 2016-10-15 ENCOUNTER — Ambulatory Visit (INDEPENDENT_AMBULATORY_CARE_PROVIDER_SITE_OTHER): Payer: 59 | Admitting: Obstetrics and Gynecology

## 2016-10-15 ENCOUNTER — Ambulatory Visit: Payer: 59 | Admitting: Obstetrics and Gynecology

## 2016-10-15 VITALS — BP 112/78 | HR 66 | Resp 12 | Ht 66.5 in | Wt 162.0 lb

## 2016-10-15 DIAGNOSIS — Z0000 Encounter for general adult medical examination without abnormal findings: Secondary | ICD-10-CM

## 2016-10-15 DIAGNOSIS — E039 Hypothyroidism, unspecified: Secondary | ICD-10-CM | POA: Diagnosis not present

## 2016-10-15 DIAGNOSIS — Z01419 Encounter for gynecological examination (general) (routine) without abnormal findings: Secondary | ICD-10-CM

## 2016-10-15 DIAGNOSIS — Z1151 Encounter for screening for human papillomavirus (HPV): Secondary | ICD-10-CM | POA: Diagnosis not present

## 2016-10-15 LAB — POCT URINALYSIS DIPSTICK
Bilirubin, UA: NEGATIVE
Blood, UA: NEGATIVE
GLUCOSE UA: NEGATIVE
Ketones, UA: NEGATIVE
Leukocytes, UA: NEGATIVE
NITRITE UA: NEGATIVE
Protein, UA: NEGATIVE
Urobilinogen, UA: NEGATIVE
pH, UA: 5

## 2016-10-15 LAB — CBC
HEMATOCRIT: 37.4 % (ref 35.0–45.0)
HEMOGLOBIN: 11.8 g/dL (ref 11.7–15.5)
MCH: 28.2 pg (ref 27.0–33.0)
MCHC: 31.6 g/dL — ABNORMAL LOW (ref 32.0–36.0)
MCV: 89.3 fL (ref 80.0–100.0)
MPV: 11 fL (ref 7.5–12.5)
Platelets: 213 10*3/uL (ref 140–400)
RBC: 4.19 MIL/uL (ref 3.80–5.10)
RDW: 13.4 % (ref 11.0–15.0)
WBC: 5.9 10*3/uL (ref 3.8–10.8)

## 2016-10-15 LAB — HEMOGLOBIN, FINGERSTICK: Hemoglobin, fingerstick: 11.6 g/dL — ABNORMAL LOW (ref 12.0–16.0)

## 2016-10-15 MED ORDER — SYNTHROID 125 MCG PO TABS: 125.0000 ug | ORAL_TABLET | Freq: Every day | ORAL | 3 refills | Status: DC

## 2016-10-15 NOTE — Progress Notes (Signed)
40 y.o. 213P2012 Married Caucasian female here for annual exam.   Trying to loose weight.  Lost over 20 pounds this year.  Dietary change.  Walking for exercise.  Want to do couch to 5K!  Needs pap.  Will do mammogram.   PCP:  None   Patient's last menstrual period was 06/22/2011.           Sexually active: Yes.   female The current method of family planning is status post hysterectomy--Supracervical.    Exercising: No.   Smoker:  no  Health Maintenance: Pap:  04-02-13 Neg:Neg HR HPV History of abnormal Pap:  no MMG:  10-12-15 3D Saline implants/Density C/Neg/BiRads1:[facility] Colonoscopy:  2010 normal with Dr. Vic RipperMagod(due to rectal bleeding and anemia--diagnosed with hemorrhoids/ulcers)  BMD:   n/a  Result  n/a TDaP:  [facility] Gardasil:   N/A HIV:  Neg in pregnancy.  Hep C:  NA Screening Labs:  Hb today: 11.6, Urine today: Neg   reports that she has never smoked. She has never used smokeless tobacco. She reports that she does not drink alcohol or use drugs.  Past Medical History:  Diagnosis Date  . Anemia   . Autoimmune disorder (HCC)   . Gastric ulcer   . Iron deficiency anemia    hx iron infusions with Dr. Myna HidalgoEnnever  . Thyroid disease    hypothyroid    Past Surgical History:  Procedure Laterality Date  . AUGMENTATION MAMMAPLASTY  2004  . BREAST SURGERY     augmentation  . EYE SURGERY     lasik  . LAPAROSCOPIC SUPRACERVICAL HYSTERECTOMY  07/10/2011   Procedure: LAPAROSCOPIC SUPRACERVICAL HYSTERECTOMY;  Surgeon: Melony OverlyBrook A Silva;  Location: [facility];  Service: Gynecology;  Laterality: N/A;  Laparoscopic Supracervical Hysterectomy & Cystoscopy  . right knee    . vestibuectomy  2003   Dr Edward JollySilva    Current Outpatient Prescriptions  Medication Sig Dispense Refill  . Calcium-Vitamin D-Vitamin K (CALCIUM SOFT CHEWS) 500-100-40 MG-UNT-MCG CHEW Chew 1 tablet by mouth 2 (two) times daily.    Marland Kitchen. ibuprofen (ADVIL,MOTRIN) 200 MG tablet Take 200 mg by mouth  every 6 (six) hours as needed for pain.    Marland Kitchen. omeprazole-sodium bicarbonate (ZEGERID) 40-1100 MG per capsule Take 1 capsule by mouth daily before breakfast.    . SYNTHROID 125 MCG tablet TAKE 1 TABLET BY MOUTH DAILY. 30 tablet 0   No current facility-administered medications for this visit.     Family History  Problem Relation Age of Onset  . Rheum arthritis Mother   . Hypertension Mother   . Migraines Mother   . Hyperlipidemia Mother   . Thyroid disease Mother   . Thyroid disease Father   . Seizures Sister   . Hypertension Maternal Grandmother   . Hyperlipidemia Maternal Grandmother   . Stroke Maternal Grandmother   . Hypertension Paternal Grandmother   . Stroke Paternal Grandmother     ROS:  Pertinent items are noted in HPI.  Otherwise, a comprehensive ROS was negative.  Exam:   BP 112/78 (BP Location: Right Arm, Patient Position: Sitting, Cuff Size: Normal)   Pulse 66   Resp 12   Ht 5' 6.5" (1.689 m)   Wt 162 lb (73.5 kg)   LMP 06/22/2011   BMI 25.76 kg/m     General appearance: alert, cooperative and appears stated age Head: Normocephalic, without obvious abnormality, atraumatic Neck: no adenopathy, supple, symmetrical, trachea midline and thyroid normal to inspection and palpation Lungs: clear  to auscultation bilaterally Breasts: normal appearance, no masses or tenderness, No nipple retraction or dimpling, No nipple discharge or bleeding, No axillary or supraclavicular adenopathy Heart: regular rate and rhythm Abdomen: soft, non-tender; no masses, no organomegaly Extremities: extremities normal, atraumatic, no cyanosis or edema Skin: Skin color, texture, turgor normal. No rashes or lesions Lymph nodes: Cervical, supraclavicular, and axillary nodes normal. No abnormal inguinal nodes palpated Neurologic: Grossly normal  Pelvic: External genitalia:  no lesions              Urethra:  normal appearing urethra with no masses, tenderness or lesions               Bartholins and Skenes: normal                 Vagina: normal appearing vagina with normal color and discharge, no lesions              Cervix: no lesions              Pap taken: Yes.   Bimanual Exam:  Uterus:  absent              Adnexa: no mass, fullness, tenderness              Rectal exam: Yes.  .  Confirms.              Anus:  normal sphincter tone, no lesions  Chaperone was present for exam.  Assessment:   Well woman visit with normal exam. Status post supracervical hysterectomy.  Hx anemia. Hypothyroidism.  Plan: Mammogram discussed.  She will schedule. Recommended self breast awareness. Pap and HR HPV as above. Guidelines for Calcium, Vitamin D, regular exercise program including cardiovascular and weight bearing exercise. Routine labs and TFTs.  Refill of Synthroid for one year. Follow up annually and prn.     After visit summary provided.

## 2016-10-15 NOTE — Patient Instructions (Signed)

## 2016-10-16 LAB — COMPREHENSIVE METABOLIC PANEL
ALK PHOS: 59 U/L (ref 33–115)
ALT: 8 U/L (ref 6–29)
AST: 17 U/L (ref 10–30)
Albumin: 4.1 g/dL (ref 3.6–5.1)
BILIRUBIN TOTAL: 0.4 mg/dL (ref 0.2–1.2)
BUN: 7 mg/dL (ref 7–25)
CALCIUM: 9.2 mg/dL (ref 8.6–10.2)
CO2: 24 mmol/L (ref 20–31)
CREATININE: 0.6 mg/dL (ref 0.50–1.10)
Chloride: 105 mmol/L (ref 98–110)
GLUCOSE: 87 mg/dL (ref 65–99)
Potassium: 3.9 mmol/L (ref 3.5–5.3)
Sodium: 141 mmol/L (ref 135–146)
Total Protein: 6.9 g/dL (ref 6.1–8.1)

## 2016-10-16 LAB — LIPID PANEL
CHOLESTEROL: 134 mg/dL (ref 125–200)
HDL: 47 mg/dL (ref >=46)
LDL Cholesterol: 76 mg/dL (ref <130)
Total CHOL/HDL Ratio: 2.9 Ratio (ref <=5.0)
Triglycerides: 55 mg/dL (ref <150)
VLDL: 11 mg/dL (ref <30)

## 2016-10-16 LAB — THYROID PANEL WITH TSH
Free Thyroxine Index: 2.8 (ref 1.4–3.8)
T3 Uptake: 34 % (ref 22–35)
T4, Total: 8.1 ug/dL (ref 4.5–12.0)
TSH: 3.87 mIU/L

## 2016-10-16 LAB — VITAMIN D 25 HYDROXY (VIT D DEFICIENCY, FRACTURES): Vit D, 25-Hydroxy: 28 ng/mL — ABNORMAL LOW (ref 30–100)

## 2016-10-17 ENCOUNTER — Telehealth: Payer: Self-pay | Admitting: Obstetrics and Gynecology

## 2016-10-17 LAB — IPS PAP TEST WITH HPV

## 2016-10-17 NOTE — Telephone Encounter (Signed)
Spoke with patient. Patient calling to review recent lab results. Advised as seen below per Dr. Rica RecordsSilva's request. Patient verbalizes understanding and is agreeable. [facility] Web site info to reset MyChart Login/password for access.     Notes Recorded by Patton SallesBrook E Amundson C Silva, MD on 10/16/2016 at 5:26 PM EDT Results to patient through My Chart.  Hello Kayla Salazar,   I am sharing good news.  Your lab results look really good overall.  Your cholesterol, thyroid testing, and blood chemistries were all normal.   Your hemoglobin was 11.8, which is normal.   Your vitamin D was just minimally low at 28.  Our goal for this is 30 - 50.  I would recommend adding an additional vit D 1000 IU daily to your routine.   The pap is not back just yet.   Have a nice Halloween!  Conley SimmondsBrook Silva, MD

## 2016-10-17 NOTE — Telephone Encounter (Signed)
Patient states sh [facility] and she cannot get into her MYCHART to see it. She does not have access to it. She would like to speak with the nurse to get the result information.

## 2016-11-20 ENCOUNTER — Other Ambulatory Visit: Payer: Self-pay | Admitting: Obstetrics and Gynecology

## 2016-11-20 DIAGNOSIS — Z1231 Encounter for screening mammogram for malignant neoplasm of breast: Secondary | ICD-10-CM

## 2016-11-20 MED FILL — SYNTHROID 125 MCG TABLET: 125 | 90 days supply | Qty: 90 | Fill #0

## 2016-12-02 DIAGNOSIS — J9801 Acute bronchospasm: Secondary | ICD-10-CM | POA: Diagnosis not present

## 2016-12-07 MED FILL — FLUCONAZOLE 150 MG TABLET: 150 | 9 days supply | Qty: 3 | Fill #0

## 2016-12-25 ENCOUNTER — Ambulatory Visit: Payer: 59

## 2017-01-21 ENCOUNTER — Other Ambulatory Visit: Payer: Self-pay | Admitting: Obstetrics and Gynecology

## 2017-01-21 ENCOUNTER — Ambulatory Visit
Admission: RE | Admit: 2017-01-21 | Discharge: 2017-01-21 | Disposition: A | Discharge status: Home / Self Care | Payer: 59 | Source: Ambulatory Visit | Attending: Obstetrics and Gynecology | Admitting: Obstetrics and Gynecology

## 2017-01-21 DIAGNOSIS — Z1231 Encounter for screening mammogram for malignant neoplasm of breast: Secondary | ICD-10-CM

## 2017-02-15 MED FILL — SYNTHROID 125 MCG TABLET: 125 | 90 days supply | Qty: 90 | Fill #1

## 2017-05-17 MED FILL — SYNTHROID 125 MCG TABLET: 125 | 90 days supply | Qty: 90 | Fill #2

## 2017-06-17 ENCOUNTER — Telehealth: Payer: Self-pay | Admitting: Obstetrics and Gynecology

## 2017-06-17 NOTE — Telephone Encounter (Signed)
Spoke with patient. Patient states that she has been having intermittent LLQ pelvic pain for 1 month. 3 days ago she began to have bloating in her LLQ and cramping into her lower back. Feels she may have an ovarian cyst. Advised patient will need to be seen for further evaluation. Patient is agreeable. States she is available after 1 pm tomorrow. Appointment scheduled for 06/18/2017 at 3:30 pm with Dr.Jertson as Dr.Silva will be out of the office. Patient is agreeable to date, time, and to see another provider. Aware if symptoms worsen or develops new symptoms she may be seen at the ER overnight.  Cc: Dr.Jertson  Routing to provider for final review. Patient agreeable to disposition. Will close encounter.

## 2017-06-17 NOTE — Telephone Encounter (Signed)
Patient would like to speak to a nurse thinks she may have an ovarian cyst.

## 2017-06-18 ENCOUNTER — Ambulatory Visit (INDEPENDENT_AMBULATORY_CARE_PROVIDER_SITE_OTHER): Payer: 59 | Admitting: Obstetrics and Gynecology

## 2017-06-18 ENCOUNTER — Encounter: Payer: Self-pay | Admitting: Obstetrics and Gynecology

## 2017-06-18 VITALS — BP 100/60 | HR 70 | Temp 99.0°F | Resp 16 | Ht 66.5 in | Wt 159.0 lb

## 2017-06-18 DIAGNOSIS — N949 Unspecified condition associated with female genital organs and menstrual cycle: Secondary | ICD-10-CM

## 2017-06-18 DIAGNOSIS — R102 Pelvic and perineal pain: Secondary | ICD-10-CM

## 2017-06-18 LAB — POCT URINALYSIS DIPSTICK
BILIRUBIN UA: NEGATIVE
Blood, UA: NEGATIVE
Glucose, UA: NEGATIVE
KETONES UA: NEGATIVE
Leukocytes, UA: NEGATIVE
Nitrite, UA: NEGATIVE
Protein, UA: NEGATIVE
Urobilinogen, UA: NEGATIVE E.U./dL — AB
pH, UA: 5 (ref 5.0–8.0)

## 2017-06-18 NOTE — Progress Notes (Signed)
. GYNECOLOGY  VISIT   HPI: 41 y.o.   Married  Caucasian  female   [phone] with Patient's last menstrual period was 06/22/2011.   here for LLQ pelvic pain and fullness for the last month. The pain initially just started on pain with intercourse. Over the last 4 days she has noted some "puffyness to her abdomen in her LLQ", some lower back and buttock discomfort. The pain is intermittent, achy, 3/10 in severity. Normal voiding, normal BM q day.  H/O supracervical hysterectomy, rare spotting.    GYNECOLOGIC HISTORY: Patient's last menstrual period was 06/22/2011. Contraception: supracervical hysterectomy Menopausal hormone therapy: none poct urine-neg        OB History    Gravida Para Term Preterm AB Living   3 2 2   1 2    SAB TAB Ectopic Multiple Live Births   1                 Patient Active Problem List   Diagnosis Date Noted  . OVERWEIGHT 07/07/2008  . ANEMIA-IRON DEFICIENCY 07/07/2008  . ANEMIA, SEVERE 07/07/2008  . GERD 07/07/2008  . PEPTIC ULCER DISEASE 07/07/2008  . IBS 07/07/2008  . BACK PAIN 07/07/2008  . MYALGIA 07/07/2008  . SLEEPINESS 07/07/2008  . PUD, HX OF 07/07/2008    Past Medical History:  Diagnosis Date  . Anemia   . Autoimmune disorder (HCC)   . Gastric ulcer   . Iron deficiency anemia    hx iron infusions with Dr. Myna HidalgoEnnever  . Thyroid disease    hypothyroid    Past Surgical History:  Procedure Laterality Date  . AUGMENTATION MAMMAPLASTY  2004  . BREAST SURGERY     augmentation  . EYE SURGERY     lasik  . LAPAROSCOPIC SUPRACERVICAL HYSTERECTOMY  07/10/2011   Procedure: LAPAROSCOPIC SUPRACERVICAL HYSTERECTOMY;  Surgeon: Melony OverlyBrook A Silva;  Location: [facility];  Service: Gynecology;  Laterality: N/A;  Laparoscopic Supracervical Hysterectomy & Cystoscopy  . right knee    . vestibuectomy  2003   Dr Edward JollySilva    Current Outpatient Prescriptions  Medication Sig Dispense Refill  . CALCIUM-VITAMIN D PO Take by mouth daily.    Marland Kitchen. ibuprofen (ADVIL,MOTRIN)  200 MG tablet Take 200 mg by mouth every 6 (six) hours as needed for pain.    Marland Kitchen. omeprazole-sodium bicarbonate (ZEGERID) 40-1100 MG per capsule Take 1 capsule by mouth daily before breakfast.    . SYNTHROID 125 MCG tablet Take 1 tablet (125 mcg total) by mouth daily. 90 tablet 3   No current facility-administered medications for this visit.      ALLERGIES: Codeine and Latex  Family History  Problem Relation Age of Onset  . Rheum arthritis Mother   . Hypertension Mother   . Migraines Mother   . Hyperlipidemia Mother   . Thyroid disease Mother   . Thyroid disease Father   . Seizures Sister   . Hypertension Maternal Grandmother   . Hyperlipidemia Maternal Grandmother   . Stroke Maternal Grandmother   . Hypertension Paternal Grandmother   . Stroke Paternal Grandmother     Social History   Social History  . Marital status: Married    Spouse name: N/A  . Number of children: N/A  . Years of education: N/A   Occupational History  . Not on file.   Social History Main Topics  . Smoking status: Never Smoker  . Smokeless tobacco: Never Used  . Alcohol use No  . Drug use: No  . Sexual activity: Yes  Partners: Male    Birth control/ protection: Surgical     Comment: supracervical hyst 2012   Other Topics Concern  . Not on file   Social History Narrative  . No narrative on file    Review of Systems  Constitutional: Negative.   HENT: Negative.   Eyes: Negative.   Respiratory: Negative.   Cardiovascular: Negative.   Gastrointestinal: Negative.   Genitourinary:       Pain with intercourse  Musculoskeletal: Negative.   Skin: Negative.   Neurological: Negative.   Endo/Heme/Allergies: Negative.   Psychiatric/Behavioral: Negative.     PHYSICAL EXAMINATION:    BP 100/60   Pulse 70   Temp 99 F (37.2 C) (Oral)   Resp 16   Ht 5' 6.5" (1.689 m)   Wt 159 lb (72.1 kg)   LMP 06/22/2011   BMI 25.28 kg/m     General appearance: alert, cooperative and appears stated  age Abdomen: soft, mildly tender BLQ, most tender in the LLQ, no rebound, no guarding, not distended; bowel sounds normal; no masses,  no organomegaly  Pelvic: External genitalia:  no lesions              Urethra:  normal appearing urethra with no masses, tenderness or lesions              Bartholins and Skenes: normal                 Vagina: normal appearing vagina with normal color and discharge, no lesions              Cervix: no cervical motion tenderness and no lesions              Bimanual Exam:  Uterus:  uterus absent              Adnexa: fullness and tenderness in the left adnexa. No masses or tenderness in the right adnexa              Rectovaginal: Yes.  .  Confirms. More prominent fullness in the left adnexa on RV exam               Anus:  normal sphincter tone, no lesions  Chaperone was present for exam.  ASSESSMENT LLQ abdominal/pelvic pain Left adnexal fullness and pain    PLAN Return for GYN ultrasound Take tylenol or ibuprofen as needed for pain    An After Visit Summary was printed and given to the patient.  Over 15 minutes face to face time of which over 50% was spent in counseling.   CC: Dr Edward Jolly

## 2017-06-19 ENCOUNTER — Telehealth: Payer: Self-pay | Admitting: Obstetrics and Gynecology

## 2017-06-19 NOTE — Telephone Encounter (Signed)
Patient is ready to schedule her PUS appointment. Patient is returning a call to [place].

## 2017-06-20 ENCOUNTER — Ambulatory Visit (INDEPENDENT_AMBULATORY_CARE_PROVIDER_SITE_OTHER): Payer: 59 | Admitting: Obstetrics and Gynecology

## 2017-06-20 ENCOUNTER — Ambulatory Visit (INDEPENDENT_AMBULATORY_CARE_PROVIDER_SITE_OTHER): Payer: 59

## 2017-06-20 ENCOUNTER — Encounter: Payer: Self-pay | Admitting: Obstetrics and Gynecology

## 2017-06-20 VITALS — BP 102/60 | HR 72 | Wt 159.0 lb

## 2017-06-20 DIAGNOSIS — N83202 Unspecified ovarian cyst, left side: Secondary | ICD-10-CM

## 2017-06-20 DIAGNOSIS — N949 Unspecified condition associated with female genital organs and menstrual cycle: Secondary | ICD-10-CM

## 2017-06-20 DIAGNOSIS — R102 Pelvic and perineal pain: Secondary | ICD-10-CM

## 2017-06-20 NOTE — Telephone Encounter (Signed)
Spoke with patient regarding benefit for ultrasound. Patient understood and agreeable. Patient ready to schedule. Patient scheduled 06/20/17 with Dr Edward JollySilva.  Patient aware of date, arrival time and cancellation policy. further questions. Ok to close

## 2017-06-20 NOTE — Progress Notes (Signed)
GYNECOLOGY  VISIT   HPI: 41 y.o.   Married  Caucasian  female   [phone] with Patient's last menstrual period was 06/22/2011.   here for Ultrasound.  Left pelvic fullness and pressure for one month.  Having some positional discomfort with intercourse. Not improved very much but states she is functional. Saw Dr. Reyne DumasJerston this week who indicated the ultrasound today.   BM every other day.  No diarrhea.  GI is Dr. Claretha CooperMagood.  Had upper GIs.  Hx of ulcer.  Had colonoscopy over 5 years ago and was normal.   Has duplication of bilateral ureters.  No dysuria.  Lost 60 pounds through dietary change.   GYNECOLOGIC HISTORY: Patient's last menstrual period was 06/22/2011. Contraception: supracervical hysterectomy Menopausal hormone therapy:  none Last mammogram:  01/21/17 BIRADS 1 negative/density d Last pap smear:   10/15/16 Pap and HR HPV negative        OB History    Gravida Para Term Preterm AB Living   3 2 2   1 2    SAB TAB Ectopic Multiple Live Births   1                 Patient Active Problem List   Diagnosis Date Noted  . OVERWEIGHT 07/07/2008  . ANEMIA-IRON DEFICIENCY 07/07/2008  . ANEMIA, SEVERE 07/07/2008  . GERD 07/07/2008  . PEPTIC ULCER DISEASE 07/07/2008  . IBS 07/07/2008  . BACK PAIN 07/07/2008  . MYALGIA 07/07/2008  . SLEEPINESS 07/07/2008  . PUD, HX OF 07/07/2008    Past Medical History:  Diagnosis Date  . Anemia   . Autoimmune disorder (HCC)   . 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  IVP done in 2012.  . Gastric ulcer   . Iron deficiency anemia    hx iron infusions with Dr. Myna HidalgoEnnever  . Thyroid disease    hypothyroid    Past Surgical History:  Procedure Laterality Date  . AUGMENTATION MAMMAPLASTY  2004  . BREAST SURGERY     augmentation  . EYE SURGERY     lasik  . LAPAROSCOPIC SUPRACERVICAL HYSTERECTOMY  07/10/2011   Procedure: LAPAROSCOPIC SUPRACERVICAL HYSTERECTOMY;  Surgeon: Melony OverlyBrook A Silva;  Location: [facility];  Service:  Gynecology;  Laterality: N/A;  Laparoscopic Supracervical Hysterectomy & Cystoscopy  . right knee    . vestibuectomy  2003   Dr Edward JollySilva    Current Outpatient Prescriptions  Medication Sig Dispense Refill  . CALCIUM-VITAMIN D PO Take by mouth daily.    Marland Kitchen. ibuprofen (ADVIL,MOTRIN) 200 MG tablet Take 200 mg by mouth every 6 (six) hours as needed for pain.    Marland Kitchen. omeprazole-sodium bicarbonate (ZEGERID) 40-1100 MG per capsule Take 1 capsule by mouth daily before breakfast.    . SYNTHROID 125 MCG tablet Take 1 tablet (125 mcg total) by mouth daily. 90 tablet 3   No current facility-administered medications for this visit.      ALLERGIES: Codeine and Latex  Family History  Problem Relation Age of Onset  . Rheum arthritis Mother   . Hypertension Mother   . Migraines Mother   . Hyperlipidemia Mother   . Thyroid disease Mother   . Thyroid disease Father   . Seizures Sister   . Hypertension Maternal Grandmother   . Hyperlipidemia Maternal Grandmother   . Stroke Maternal Grandmother   . Hypertension Paternal Grandmother   . Stroke Paternal Grandmother     Social History   Social History  . Marital status: Married  Spouse name: N/A  . Number of children: N/A  . Years of education: N/A   Occupational History  . Not on file.   Social History Main Topics  . Smoking status: Never Smoker  . Smokeless tobacco: Never Used  . Alcohol use No  . Drug use: No  . Sexual activity: Yes    Partners: Male    Birth control/ protection: Surgical     Comment: supracervical hyst 2012   Other Topics Concern  . Not on file   Social History Narrative  . No narrative on file    ROS:  Pertinent items are noted in HPI.  PHYSICAL EXAMINATION:    BP 102/60 (BP Location: Right Arm, Patient Position: Sitting, Cuff Size: Normal)   Pulse 72   Wt 159 lb (72.1 kg)   LMP 06/22/2011   BMI 25.28 kg/m     General appearance: alert, cooperative and appears stated age  Pelvic ultrasound: Uterus  absent.  Normal cuff.  Right ovary 5 mm collapsed CL.Marland Kitchen  Left ovary 18 mm hemorrhagic CL cyst. No free fluid.   ASSESSMENT  Status post supracervical hysterectomy.  LLQ pain.  Small hemorrhagic left ovarian cyst on ultrasound.  Recent significant weight loss which may be prompting ovulation.   PLAN  We discussed potential causes of the pain - ovarian cyst, fibrosis, GI, musculoskeletal.  I favor the hemorrhagic cyst or fibrosis as the most likely etiologies.  May consider Colace 100 mg daily to see if this improves pain.  Can see GI if pain does not improve. Laparoscopy if work up is negative and pain persists.  She states she is relieved that there is nothing serious seen on ultrasound.  An After Visit Summary was printed and given to the patient.  __15____ minutes face to face time of which over 50% was spent in counseling.

## 2017-06-20 NOTE — Progress Notes (Signed)
Encounter reviewed by Dr. Dereke Neumann Amundson C. Silva.  

## 2017-07-12 MED FILL — FLUCONAZOLE 150 MG TABLET: 150 | 9 days supply | Qty: 3 | Fill #1

## 2017-08-22 MED FILL — SYNTHROID 125 MCG TABLET: 125 | 90 days supply | Qty: 90 | Fill #3

## 2017-11-04 NOTE — Progress Notes (Deleted)
41 y.o. G3P2012 Married Caucasian female here for annual exam.    PCP:     Patient's last menstrual period was 06/22/2011.           Sexually active: {yes no:314532}  The current method of family planning is status post hysterectomy--SUPRACERVICAL.    Exercising: {yes no:314532}  {types:19826} Smoker:  no  Health Maintenance: Pap: 10-15-16 Neg:Neg HR HPV, 04-02-13 Neg:Neg HR HPV History of abnormal Pap:  no MMG: 01-21-17 Implants/Density D/Neg/BiRads1:TBC Colonoscopy:  2010 normal with Dr. Vic Ripper to rectal bleeding and anemia--diagnosed with hemorrhoids/ulcers)  BMD:   n/a  Result  n/a TDaP:  Up to date through [facility] Gardasil:   no HIV:Neg in Pregnancy Hep C:**** Screening Labs:  Hb today: ***, Urine today: ***   reports that  has never smoked. she has never used smokeless tobacco. She reports that she does not drink alcohol or use drugs.  Past Medical History:  Diagnosis Date  . Anemia   . Autoimmune disorder (HCC)   . 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  IVP done in 2012.  . Gastric ulcer   . Iron deficiency anemia    hx iron infusions with Dr. Myna Hidalgo  . Thyroid disease    hypothyroid    Past Surgical History:  Procedure Laterality Date  . AUGMENTATION MAMMAPLASTY  2004  . BREAST SURGERY     augmentation  . EYE SURGERY     lasik  . right knee    . vestibuectomy  2003   Dr Edward Jolly    Current Outpatient Medications  Medication Sig Dispense Refill  . CALCIUM-VITAMIN D PO Take by mouth daily.    Marland Kitchen ibuprofen (ADVIL,MOTRIN) 200 MG tablet Take 200 mg by mouth every 6 (six) hours as needed for pain.    Marland Kitchen omeprazole-sodium bicarbonate (ZEGERID) 40-1100 MG per capsule Take 1 capsule by mouth daily before breakfast.    . SYNTHROID 125 MCG tablet Take 1 tablet (125 mcg total) by mouth daily. 90 tablet 3   No current facility-administered medications for this visit.     Family History  Problem Relation Age of Onset  . Rheum arthritis  Mother   . Hypertension Mother   . Migraines Mother   . Hyperlipidemia Mother   . Thyroid disease Mother   . Thyroid disease Father   . Seizures Sister   . Hypertension Maternal Grandmother   . Hyperlipidemia Maternal Grandmother   . Stroke Maternal Grandmother   . Hypertension Paternal Grandmother   . Stroke Paternal Grandmother     ROS:  Pertinent items are noted in HPI.  Otherwise, a comprehensive ROS was negative.  Exam:   LMP 06/22/2011     General appearance: alert, cooperative and appears stated age Head: Normocephalic, without obvious abnormality, atraumatic Neck: no adenopathy, supple, symmetrical, trachea midline and thyroid normal to inspection and palpation Lungs: clear to auscultation bilaterally Breasts: normal appearance, no masses or tenderness, No nipple retraction or dimpling, No nipple discharge or bleeding, No axillary or supraclavicular adenopathy Heart: regular rate and rhythm Abdomen: soft, non-tender; no masses, no organomegaly Extremities: extremities normal, atraumatic, no cyanosis or edema Skin: Skin color, texture, turgor normal. No rashes or lesions Lymph nodes: Cervical, supraclavicular, and axillary nodes normal. No abnormal inguinal nodes palpated Neurologic: Grossly normal  Pelvic: External genitalia:  no lesions              Urethra:  normal appearing urethra with no masses, tenderness or lesions  Bartholins and Skenes: normal                 Vagina: normal appearing vagina with normal color and discharge, no lesions              Cervix: no lesions              Pap taken: {yes no:314532} Bimanual Exam:  Uterus:  normal size, contour, position, consistency, mobility, non-tender              Adnexa: no mass, fullness, tenderness              Rectal exam: {yes no:314532}.  Confirms.              Anus:  normal sphincter tone, no lesions  Chaperone was present for exam.  Assessment:   Well woman visit with normal  exam.   Plan: Mammogram screening discussed. Recommended self breast awareness. Pap and HR HPV as above. Guidelines for Calcium, Vitamin D, regular exercise program including cardiovascular and weight bearing exercise.   Follow up annually and prn.   Additional counseling given.  {yes [id number]:314532}. _______ minutes face to face time of which over 50% was spent in counseling.    After visit summary provided.

## 2017-11-06 ENCOUNTER — Ambulatory Visit: Payer: 59 | Admitting: Obstetrics and Gynecology

## 2017-12-03 ENCOUNTER — Other Ambulatory Visit: Payer: Self-pay | Admitting: Obstetrics and Gynecology

## 2017-12-03 NOTE — Telephone Encounter (Signed)
Medication refill request: synthroid  Last AEX:  10/15/16 BS  Next AEX: 12/26/17  Last MMG (if hormonal medication request): 01/21/17 BIRADS 1 negative Refill authorized: 10/15/16 #90, 3RF. Today, please advise.

## 2017-12-04 MED FILL — SYNTHROID 125 MCG TABLET: 125 | 90 days supply | Qty: 90 | Fill #0

## 2017-12-26 ENCOUNTER — Ambulatory Visit: Payer: 59 | Admitting: Obstetrics and Gynecology

## 2018-01-20 ENCOUNTER — Telehealth: Payer: 59 | Admitting: Family

## 2018-01-20 DIAGNOSIS — J028 Acute pharyngitis due to other specified organisms: Secondary | ICD-10-CM

## 2018-01-20 DIAGNOSIS — B9689 Other specified bacterial agents as the cause of diseases classified elsewhere: Secondary | ICD-10-CM

## 2018-01-20 MED ORDER — BENZONATATE 100 MG PO CAPS: 100.0000 mg | ORAL_CAPSULE | Freq: Three times a day (TID) | ORAL | 0 refills | Status: DC | PRN

## 2018-01-20 MED ORDER — AZITHROMYCIN 250 MG PO TABS: ORAL_TABLET | ORAL | 0 refills | Status: DC

## 2018-01-20 MED ORDER — PREDNISONE 5 MG PO TABS: 5.0000 mg | ORAL_TABLET | ORAL | 0 refills | Status: DC

## 2018-01-20 MED ORDER — ALBUTEROL SULFATE HFA 108 (90 BASE) MCG/ACT IN AERS: 2.0000 | INHALATION_SPRAY | Freq: Four times a day (QID) | RESPIRATORY_TRACT | 2 refills | Status: DC | PRN

## 2018-01-20 MED FILL — BENZONATATE 100 MG CAP: 100 | 5 days supply | Qty: 30 | Fill #0

## 2018-01-20 MED FILL — predniSONE 5 MG (21) TBPK: 5 | 6 days supply | Qty: 21 | Fill #0

## 2018-01-20 MED FILL — VENTOLIN HFA 90 MCG INHALER: 108 (90 BAS | 25 days supply | Qty: 18 | Fill #0

## 2018-01-20 MED FILL — AZITHROMYCIN 250 MG TAB: 250 | 5 days supply | Qty: 6 | Fill #0

## 2018-01-20 NOTE — Progress Notes (Signed)
Thank you for the details you included in the comment boxes. Those details are very helpful in determining the best course of treatment for you and help us to provide the best care. The treatment plan includes sinus and lung infections but will say cough below. I have modified it to treat you. I also added an Albuterol HFA inhaler, that you can use 1-2 puffs every 6 hours for shortness of breath.  We are sorry that you are not feeling well.  Here is how we plan to help!  Based on your presentation I believe you most likely have A cough due to bacteria.  When patients have a fever and a productive cough with a change in color or increased sputum production, we are concerned about bacterial bronchitis.  If left untreated it can progress to pneumonia.  If your symptoms do not improve with your treatment plan it is important that you contact your provider.   I have prescribed Azithromyin 250 mg: two tablets now and then one tablet daily for 4 additonal days    In addition you may use A non-prescription cough medication called Mucinex DM: take 2 tablets every 12 hours. and A prescription cough medication called Tessalon Perles 100mg . You may take 1-2 capsules every 8 hours as needed for your cough.  Sterapred 5 mg dosepak  From your responses in the eVisit questionnaire you describe inflammation in the upper respiratory tract which is causing a significant cough.  This is commonly called Bronchitis and has four common causes:    Allergies  Viral Infections  Acid Reflux  Bacterial Infection Allergies, viruses and acid reflux are treated by controlling symptoms or eliminating the cause. An example might be a cough caused by taking certain blood pressure medications. You stop the cough by changing the medication. Another example might be a cough caused by acid reflux. Controlling the reflux helps control the cough.  USE OF BRONCHODILATOR ("RESCUE") INHALERS: There is a risk from using your  bronchodilator too frequently.  The risk is that over-reliance on a medication which only relaxes the muscles surrounding the breathing tubes can reduce the effectiveness of medications prescribed to reduce swelling and congestion of the tubes themselves.  Although you feel brief relief from the bronchodilator inhaler, your asthma may actually be worsening with the tubes becoming more swollen and filled with mucus.  This can delay other crucial treatments, such as oral steroid medications. If you need to use a bronchodilator inhaler daily, several times per day, you should discuss this with your provider.  There are probably better treatments that could be used to keep your asthma under control.     HOME CARE . Only take medications as instructed by your medical team. . Complete the entire course of an antibiotic. . Drink plenty of fluids and get plenty of rest. . Avoid close contacts especially the very young and the elderly . Cover your mouth if you cough or cough into your sleeve. . Always remember to wash your hands . A steam or ultrasonic humidifier can help congestion.   GET HELP RIGHT AWAY IF: . You develop worsening fever. . You become short of breath . You cough up blood. . Your symptoms persist after you have completed your treatment plan MAKE SURE YOU   Understand these instructions.  Will watch your condition.  Will get help right away if you are not doing well or get worse.  Your e-visit answers were reviewed by a board certified advanced clinical practitioner to  complete your personal care plan.  Depending on the condition, your plan could have included both over the counter or prescription medications. If there is a problem please reply  once you have received a response from your provider. Your safety is important to [place].  If you have drug allergies check your prescription carefully.    You can use MyChart to ask questions about today's visit, request a non-urgent call  back, or ask for a work or school excuse for 24 hours related to this e-Visit. If it has been greater than 24 hours you will need to follow up with your provider, or enter a new e-Visit to address those concerns. You will get an e-mail in the next two days asking about your experience.  I hope that your e-visit has been valuable and will speed your recovery. Thank you for using e-visits.

## 2018-03-07 ENCOUNTER — Other Ambulatory Visit: Payer: Self-pay | Admitting: Obstetrics and Gynecology

## 2018-03-07 NOTE — Telephone Encounter (Signed)
Medication refill request: Synthroid 125mcg #90,0R Last AEX:  10-15-16 Next AEX: 05-23-18 Last MMG (if hormonal medication request): 01-21-17 [id number] Refill authorized: please advise

## 2018-03-20 MED FILL — SYNTHROID 125 MCG TABLET: 125 | 90 days supply | Qty: 90 | Fill #0

## 2018-05-23 ENCOUNTER — Ambulatory Visit: Payer: 59 | Admitting: Obstetrics and Gynecology

## 2018-06-04 ENCOUNTER — Other Ambulatory Visit: Payer: Self-pay | Admitting: Obstetrics and Gynecology

## 2018-06-04 ENCOUNTER — Ambulatory Visit
Admission: RE | Admit: 2018-06-04 | Discharge: 2018-06-04 | Disposition: A | Discharge status: Home / Self Care | Payer: 59 | Source: Ambulatory Visit | Attending: Obstetrics and Gynecology | Admitting: Obstetrics and Gynecology

## 2018-06-04 DIAGNOSIS — Z1231 Encounter for screening mammogram for malignant neoplasm of breast: Secondary | ICD-10-CM | POA: Diagnosis not present

## 2018-06-24 ENCOUNTER — Ambulatory Visit (INDEPENDENT_AMBULATORY_CARE_PROVIDER_SITE_OTHER): Payer: 59 | Admitting: Obstetrics and Gynecology

## 2018-06-24 ENCOUNTER — Other Ambulatory Visit: Payer: Self-pay

## 2018-06-24 ENCOUNTER — Other Ambulatory Visit (HOSPITAL_COMMUNITY)
Admission: RE | Admit: 2018-06-24 | Discharge: 2018-06-24 | Disposition: A | Discharge status: Home / Self Care | Payer: 59 | Source: Ambulatory Visit | Attending: Obstetrics and Gynecology | Admitting: Obstetrics and Gynecology

## 2018-06-24 ENCOUNTER — Encounter: Payer: Self-pay | Admitting: Obstetrics and Gynecology

## 2018-06-24 VITALS — BP 110/70 | HR 64 | Resp 16 | Ht 67.5 in

## 2018-06-24 DIAGNOSIS — Z01419 Encounter for gynecological examination (general) (routine) without abnormal findings: Secondary | ICD-10-CM | POA: Insufficient documentation

## 2018-06-24 DIAGNOSIS — Z90710 Acquired absence of both cervix and uterus: Secondary | ICD-10-CM | POA: Diagnosis not present

## 2018-06-24 DIAGNOSIS — E039 Hypothyroidism, unspecified: Secondary | ICD-10-CM | POA: Diagnosis not present

## 2018-06-24 DIAGNOSIS — Z862 Personal history of diseases of the blood and blood-forming organs and certain disorders involving the immune mechanism: Secondary | ICD-10-CM | POA: Diagnosis not present

## 2018-06-24 DIAGNOSIS — Z1151 Encounter for screening for human papillomavirus (HPV): Secondary | ICD-10-CM | POA: Insufficient documentation

## 2018-06-24 MED ORDER — SYNTHROID 125 MCG PO TABS: 125.0000 ug | ORAL_TABLET | Freq: Every day | ORAL | 3 refills | Status: DC

## 2018-06-24 MED FILL — SYNTHROID 125 MCG TABLET: 125 | 90 days supply | Qty: 90 | Fill #0

## 2018-06-24 NOTE — Progress Notes (Signed)
42 y.o. [id number] Married Caucasian female here for annual exam.    Gained 20 pounds and can explain it through lifestyle changes.   PCP:   Does not have one.   Patient's last menstrual period was 06/22/2011.           Sexually active: Yes.    The current method of family planning is status post hysterectomy.(Supracervical Hysterectomy)    Exercising: No.  exercise Smoker:  no  Health Maintenance: Pap:  04-02-13 neg HPV HR neg, 10-15-16 neg HPV HR neg History of abnormal Pap:  no MMG:  06-04-18 category d density birads 1:neg Colonoscopy:  2010 BMD:   n/a  Result  n/a TDaP:  UTD Gardasil: no HIV: during pregnancy Hep C: during pregnancy Screening Labs:   Today.    reports that she has never smoked. She has never used smokeless tobacco. She reports that she does not drink alcohol or use drugs.  Past Medical History:  Diagnosis Date  . Anemia   . Autoimmune disorder (HCC)   . 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  IVP done in 2012.  . Gastric ulcer   . Iron deficiency anemia    hx iron infusions with Dr. Myna HidalgoEnnever  . Thyroid disease    hypothyroid    Past Surgical History:  Procedure Laterality Date  . AUGMENTATION MAMMAPLASTY  2004  . BREAST SURGERY     augmentation  . ESOPHAGEAL DILATION     Dr. Ewing SchleinMagod.  Procedure done twice in past.   . EYE SURGERY     lasik  . LAPAROSCOPIC SUPRACERVICAL HYSTERECTOMY  07/10/2011   Procedure: LAPAROSCOPIC SUPRACERVICAL HYSTERECTOMY;  Surgeon: Melony OverlyBrook A Silva;  Location: [facility];  Service: Gynecology;  Laterality: N/A;  Laparoscopic Supracervical Hysterectomy & Cystoscopy  . right knee    . vestibuectomy  2003   Dr Edward JollySilva    Current Outpatient Medications  Medication Sig Dispense Refill  . CALCIUM-VITAMIN D PO Take by mouth daily.    Marland Kitchen. ibuprofen (ADVIL,MOTRIN) 200 MG tablet Take 200 mg by mouth every 6 (six) hours as needed for pain.    Marland Kitchen. omeprazole-sodium bicarbonate (ZEGERID) 40-1100 MG per capsule Take 1  capsule by mouth daily before breakfast.    . SYNTHROID 125 MCG tablet Take 1 tablet (125 mcg total) by mouth daily. 90 tablet 3   No current facility-administered medications for this visit.     Family History  Problem Relation Age of Onset  . Rheum arthritis Mother   . Hypertension Mother   . Migraines Mother   . Hyperlipidemia Mother   . Thyroid disease Mother   . Thyroid disease Father   . Seizures Sister   . Hypertension Maternal Grandmother   . Hyperlipidemia Maternal Grandmother   . Stroke Maternal Grandmother   . Hypertension Paternal Grandmother   . Stroke Paternal Grandmother     Review of Systems  Constitutional: Negative.   HENT: Negative.   Eyes: Negative.   Respiratory: Negative.   Cardiovascular: Negative.   Gastrointestinal: Positive for constipation.  Endocrine: Negative.   Genitourinary: Negative.   Musculoskeletal: Negative.   Skin: Negative.   Allergic/Immunologic: Negative.   Neurological: Negative.   Hematological: Negative.   Psychiatric/Behavioral: Negative.     Exam:   BP 110/70   Pulse 64   Resp 16   Ht 5' 7.5" (1.715 m)   LMP 06/22/2011   BMI 24.54 kg/m     General appearance: alert, cooperative and appears stated age Head: Normocephalic,  without obvious abnormality, atraumatic Neck: no adenopathy, supple, symmetrical, trachea midline and thyroid normal to inspection and palpation Lungs: clear to auscultation bilaterally Breasts: normal appearance, no masses or tenderness, No nipple retraction or dimpling, No nipple discharge or bleeding, No axillary or supraclavicular adenopathy Heart: regular rate and rhythm Abdomen: soft, non-tender; no masses, no organomegaly Extremities: extremities normal, atraumatic, no cyanosis or edema Skin: Skin color, texture, turgor normal. No rashes or lesions Lymph nodes: Cervical, supraclavicular, and axillary nodes normal. No abnormal inguinal nodes palpated Neurologic: Grossly normal  Pelvic:  External genitalia:  no lesions              Urethra:  normal appearing urethra with no masses, tenderness or lesions              Bartholins and Skenes: normal                 Vagina: normal appearing vagina with normal color and discharge, no lesions              Cervix: no lesions              Pap taken: Yes.   Bimanual Exam:  Uterus:  normal size, contour, position, consistency, mobility, non-tender              Adnexa: no mass, fullness, tenderness              Rectal exam: Yes.  .  Confirms.              Anus:  normal sphincter tone, no lesions  Chaperone was present for exam.  Assessment:   Well woman visit with normal exam. Status post supracervical hysterectomy.  Hx anemia. Hypothyroidism.  Plan: Mammogram screening. Recommended self breast awareness. Pap and HR HPV as above. Guidelines for Calcium, Vitamin D, regular exercise program including cardiovascular and weight bearing exercise. Gardasil discussed.  She declines.  Routine labs.  Refill of Synthroid for one year.  List of PCPs to patient.  Follow up annually and prn.   After visit summary provided.

## 2018-06-24 NOTE — Patient Instructions (Signed)

## 2018-06-25 LAB — CYTOLOGY - PAP
Diagnosis: NEGATIVE
HPV: NOT DETECTED

## 2018-06-25 LAB — COMPREHENSIVE METABOLIC PANEL
A/G RATIO: 1.8 (ref 1.2–2.2)
ALT: 12 IU/L (ref 0–32)
AST: 18 IU/L (ref 0–40)
Albumin: 4.1 g/dL (ref 3.5–5.5)
Alkaline Phosphatase: 67 IU/L (ref 39–117)
BILIRUBIN TOTAL: 0.3 mg/dL (ref 0.0–1.2)
BUN/Creatinine Ratio: 12 (ref 9–23)
BUN: 7 mg/dL (ref 6–24)
CALCIUM: 8.8 mg/dL (ref 8.7–10.2)
CHLORIDE: 106 mmol/L (ref 96–106)
CO2: 23 mmol/L (ref 20–29)
Creatinine, Ser: 0.58 mg/dL (ref 0.57–1.00)
GFR, EST AFRICAN AMERICAN: 131 mL/min/{1.73_m2} (ref >59)
GFR, EST NON AFRICAN AMERICAN: 114 mL/min/{1.73_m2} (ref >59)
GLOBULIN, TOTAL: 2.3 g/dL (ref 1.5–4.5)
Glucose: 80 mg/dL (ref 65–99)
POTASSIUM: 4.5 mmol/L (ref 3.5–5.2)
SODIUM: 141 mmol/L (ref 134–144)
TOTAL PROTEIN: 6.4 g/dL (ref 6.0–8.5)

## 2018-06-25 LAB — THYROID PANEL WITH TSH
Free Thyroxine Index: 1.8 (ref 1.2–4.9)
T3 Uptake Ratio: 28 % (ref 24–39)
T4 TOTAL: 6.5 ug/dL (ref 4.5–12.0)
TSH: 4.78 u[IU]/mL — AB (ref 0.450–4.500)

## 2018-06-25 LAB — VITAMIN D 25 HYDROXY (VIT D DEFICIENCY, FRACTURES): Vit D, 25-Hydroxy: 29.9 ng/mL — ABNORMAL LOW (ref 30.0–100.0)

## 2018-06-25 LAB — CBC
Hematocrit: 35.9 % (ref 34.0–46.6)
Hemoglobin: 11.4 g/dL (ref 11.1–15.9)
MCH: 27.6 pg (ref 26.6–33.0)
MCHC: 31.8 g/dL (ref 31.5–35.7)
MCV: 87 fL (ref 79–97)
PLATELETS: 265 10*3/uL (ref 150–450)
RBC: 4.13 x10E6/uL (ref 3.77–5.28)
RDW: 13.6 % (ref 12.3–15.4)
WBC: 6.4 10*3/uL (ref 3.4–10.8)

## 2018-06-25 LAB — LIPID PANEL
CHOL/HDL RATIO: 3.4 ratio (ref 0.0–4.4)
CHOLESTEROL TOTAL: 155 mg/dL (ref 100–199)
HDL: 46 mg/dL (ref >39)
LDL CALC: 90 mg/dL (ref 0–99)
Triglycerides: 94 mg/dL (ref 0–149)
VLDL Cholesterol Cal: 19 mg/dL (ref 5–40)

## 2018-06-26 NOTE — Addendum Note (Signed)
Addended by: Ardell IsaacsAMUNDSON C SILVA, Debbe BalesBROOK E on: 06/26/2018 08:56 AM   Modules accepted: Orders

## 2018-06-30 ENCOUNTER — Ambulatory Visit: Payer: 59 | Admitting: Obstetrics and Gynecology

## 2018-09-25 ENCOUNTER — Other Ambulatory Visit: Payer: 59

## 2018-09-25 ENCOUNTER — Other Ambulatory Visit (INDEPENDENT_AMBULATORY_CARE_PROVIDER_SITE_OTHER): Payer: 59

## 2018-09-25 DIAGNOSIS — E039 Hypothyroidism, unspecified: Secondary | ICD-10-CM | POA: Diagnosis not present

## 2018-09-26 LAB — TSH: TSH: 6.25 u[IU]/mL — ABNORMAL HIGH (ref 0.450–4.500)

## 2018-09-26 LAB — T4, FREE: Free T4: 1.24 ng/dL (ref 0.82–1.77)

## 2018-09-29 ENCOUNTER — Telehealth: Payer: Self-pay | Admitting: *Deleted

## 2018-09-29 MED ORDER — LEVOTHYROXINE SODIUM 137 MCG PO TABS: 137.0000 ug | ORAL_TABLET | Freq: Every day | ORAL | 1 refills | Status: DC

## 2018-09-29 NOTE — Telephone Encounter (Signed)
Returned call to patient. Results reviewed with patient and she verbalized understanding. Pharmacy on file confirmed. Prescription for synthroid , #30, 1RF sent to pharmacy on file. 6 week recheck scheduled for 11-11-18 at 1130. Patient agreeable to date and time of appointment.   Routing to provider for final review. Patient agreeable to disposition. Will close encounter.

## 2018-09-29 NOTE — Telephone Encounter (Signed)
Message left to return call to Keeanna Villafranca at [phone].    

## 2018-09-29 NOTE — Telephone Encounter (Signed)
-----   Message from Patton Salles, MD sent at 09/26/2018  9:14 AM EDT ----- Please let patient know that her TSH has gone up further.  Her free T4 is in a normal range.  I am recommend we increase her Synthroid to 137 mcg daily.  Please send to pharmacy of choice.  She will need to return in 6 weeks for a recheck of her labs.

## 2018-09-29 NOTE — Telephone Encounter (Signed)
Patient returning Kayla Salazar's call. °

## 2018-10-14 MED FILL — SYNTHROID 137 MCG TABLET: 137 | 30 days supply | Qty: 30 | Fill #0

## 2018-11-11 ENCOUNTER — Other Ambulatory Visit: Payer: 59

## 2018-11-11 ENCOUNTER — Other Ambulatory Visit (INDEPENDENT_AMBULATORY_CARE_PROVIDER_SITE_OTHER): Payer: 59

## 2018-11-11 DIAGNOSIS — E039 Hypothyroidism, unspecified: Secondary | ICD-10-CM

## 2018-11-12 LAB — T4, FREE: FREE T4: 1.38 ng/dL (ref 0.82–1.77)

## 2018-11-12 LAB — TSH: TSH: 2.72 u[IU]/mL (ref 0.450–4.500)

## 2018-11-13 ENCOUNTER — Other Ambulatory Visit: Payer: Self-pay | Admitting: Obstetrics and Gynecology

## 2018-11-13 NOTE — Telephone Encounter (Signed)
Medication refill request: Synthroid 137mcg  Last AEX:06/24/18    Next AEX: 07/17/19 Last MMG (if hormonal medication request):06/04/18 Bi-rads 1 neg  Refill authorized: #30 with 6RF

## 2018-11-26 MED FILL — SYNTHROID 137 MCG TABLET: 137 | 90 days supply | Qty: 90 | Fill #0

## 2018-12-11 ENCOUNTER — Encounter

## 2018-12-13 DIAGNOSIS — M79672 Pain in left foot: Secondary | ICD-10-CM | POA: Diagnosis not present

## 2018-12-18 DIAGNOSIS — M79672 Pain in left foot: Secondary | ICD-10-CM | POA: Diagnosis not present

## 2018-12-25 DIAGNOSIS — M79675 Pain in left toe(s): Secondary | ICD-10-CM | POA: Diagnosis not present

## 2018-12-30 ENCOUNTER — Other Ambulatory Visit: Payer: Self-pay | Admitting: Orthopaedic Surgery

## 2018-12-30 ENCOUNTER — Other Ambulatory Visit: Payer: Self-pay

## 2018-12-30 ENCOUNTER — Encounter (HOSPITAL_BASED_OUTPATIENT_CLINIC_OR_DEPARTMENT_OTHER): Payer: Self-pay | Admitting: *Deleted

## 2019-01-05 ENCOUNTER — Ambulatory Visit (HOSPITAL_BASED_OUTPATIENT_CLINIC_OR_DEPARTMENT_OTHER)
Admission: RE | Admit: 2019-01-05 | Discharge: 2019-01-05 | Disposition: A | Discharge status: Home / Self Care | Payer: 59 | Attending: Orthopaedic Surgery | Admitting: Orthopaedic Surgery

## 2019-01-05 ENCOUNTER — Ambulatory Visit (HOSPITAL_BASED_OUTPATIENT_CLINIC_OR_DEPARTMENT_OTHER): Payer: 59 | Admitting: Anesthesiology

## 2019-01-05 ENCOUNTER — Encounter (HOSPITAL_BASED_OUTPATIENT_CLINIC_OR_DEPARTMENT_OTHER): Payer: Self-pay | Admitting: Anesthesiology

## 2019-01-05 ENCOUNTER — Other Ambulatory Visit: Payer: Self-pay

## 2019-01-05 ENCOUNTER — Encounter (HOSPITAL_BASED_OUTPATIENT_CLINIC_OR_DEPARTMENT_OTHER)
Admission: RE | Disposition: A | Discharge status: Home / Self Care | Payer: Self-pay | Source: Home / Self Care | Attending: Orthopaedic Surgery

## 2019-01-05 DIAGNOSIS — W228XXA Striking against or struck by other objects, initial encounter: Secondary | ICD-10-CM | POA: Insufficient documentation

## 2019-01-05 DIAGNOSIS — Z8719 Personal history of other diseases of the digestive system: Secondary | ICD-10-CM | POA: Insufficient documentation

## 2019-01-05 DIAGNOSIS — Z888 Allergy status to other drugs, medicaments and biological substances status: Secondary | ICD-10-CM | POA: Insufficient documentation

## 2019-01-05 DIAGNOSIS — S62617A Displaced fracture of proximal phalanx of left little finger, initial encounter for closed fracture: Secondary | ICD-10-CM | POA: Insufficient documentation

## 2019-01-05 DIAGNOSIS — K219 Gastro-esophageal reflux disease without esophagitis: Secondary | ICD-10-CM | POA: Diagnosis not present

## 2019-01-05 DIAGNOSIS — Z791 Long term (current) use of non-steroidal anti-inflammatories (NSAID): Secondary | ICD-10-CM | POA: Insufficient documentation

## 2019-01-05 DIAGNOSIS — M24075 Loose body in left toe joint(s): Secondary | ICD-10-CM | POA: Diagnosis not present

## 2019-01-05 DIAGNOSIS — E039 Hypothyroidism, unspecified: Secondary | ICD-10-CM | POA: Diagnosis not present

## 2019-01-05 DIAGNOSIS — M24042 Loose body in left finger joint(s): Secondary | ICD-10-CM | POA: Diagnosis not present

## 2019-01-05 DIAGNOSIS — Z885 Allergy status to narcotic agent status: Secondary | ICD-10-CM | POA: Insufficient documentation

## 2019-01-05 DIAGNOSIS — Z79899 Other long term (current) drug therapy: Secondary | ICD-10-CM | POA: Insufficient documentation

## 2019-01-05 DIAGNOSIS — S93115A Dislocation of interphalangeal joint of left lesser toe(s), initial encounter: Secondary | ICD-10-CM | POA: Diagnosis not present

## 2019-01-05 DIAGNOSIS — S93322A Subluxation of tarsometatarsal joint of left foot, initial encounter: Secondary | ICD-10-CM | POA: Diagnosis not present

## 2019-01-05 HISTORY — PX: PROXIMAL INTERPHALANGEAL FUSION (PIP): SHX6043

## 2019-01-05 SURGERY — FUSION, PIP JOINT
Anesthesia: Monitor Anesthesia Care | Site: Toe | Laterality: Left

## 2019-01-05 MED ORDER — OXYCODONE HCL 5 MG PO TABS: 5.0000 mg | ORAL_TABLET | Freq: Once | ORAL | Status: DC | PRN

## 2019-01-05 MED ORDER — PROMETHAZINE HCL 25 MG/ML IJ SOLN: 6.2500 mg | INTRAMUSCULAR | Status: DC | PRN

## 2019-01-05 MED ORDER — BUPIVACAINE HCL 0.5 % IJ SOLN
INTRAMUSCULAR | Status: DC | PRN
  Administered 2019-01-05: 7 mL

## 2019-01-05 MED ORDER — ONDANSETRON HCL 4 MG/2ML IJ SOLN
INTRAMUSCULAR | Status: DC | PRN
  Administered 2019-01-05: 4 mg via INTRAVENOUS

## 2019-01-05 MED ORDER — HYDROMORPHONE HCL 1 MG/ML IJ SOLN: 0.2500 mg | INTRAMUSCULAR | Status: DC | PRN

## 2019-01-05 MED ORDER — HYDROCODONE-ACETAMINOPHEN 5-325 MG PO TABS: 1.0000 | ORAL_TABLET | ORAL | 0 refills | Status: AC | PRN

## 2019-01-05 MED ORDER — MIDAZOLAM HCL 2 MG/2ML IJ SOLN
INTRAMUSCULAR | Status: AC
  Filled 2019-01-05: qty 2

## 2019-01-05 MED ORDER — CHLORHEXIDINE GLUCONATE 4 % EX LIQD: 60.0000 mL | Freq: Once | CUTANEOUS | Status: DC

## 2019-01-05 MED ORDER — OXYCODONE HCL 5 MG/5ML PO SOLN: 5.0000 mg | Freq: Once | ORAL | Status: DC | PRN

## 2019-01-05 MED ORDER — PROPOFOL 500 MG/50ML IV EMUL
INTRAVENOUS | Status: DC | PRN
  Administered 2019-01-05: 50 ug/kg/min via INTRAVENOUS

## 2019-01-05 MED ORDER — FENTANYL CITRATE (PF) 100 MCG/2ML IJ SOLN
INTRAMUSCULAR | Status: AC
  Filled 2019-01-05: qty 2

## 2019-01-05 MED ORDER — MIDAZOLAM HCL 2 MG/2ML IJ SOLN
1.0000 mg | INTRAMUSCULAR | Status: DC | PRN
  Administered 2019-01-05: 2 mg via INTRAVENOUS

## 2019-01-05 MED ORDER — ONDANSETRON HCL 4 MG/2ML IJ SOLN
INTRAMUSCULAR | Status: AC
  Filled 2019-01-05: qty 2

## 2019-01-05 MED ORDER — LACTATED RINGERS IV SOLN
INTRAVENOUS | Status: DC
  Administered 2019-01-05: 10 mL/h via INTRAVENOUS

## 2019-01-05 MED ORDER — PROPOFOL 10 MG/ML IV BOLUS
INTRAVENOUS | Status: DC | PRN
  Administered 2019-01-05: 50 mg via INTRAVENOUS

## 2019-01-05 MED ORDER — PROPOFOL 500 MG/50ML IV EMUL
INTRAVENOUS | Status: AC
  Filled 2019-01-05: qty 50

## 2019-01-05 MED ORDER — FENTANYL CITRATE (PF) 100 MCG/2ML IJ SOLN
50.0000 ug | INTRAMUSCULAR | Status: DC | PRN
  Administered 2019-01-05: 100 ug via INTRAVENOUS

## 2019-01-05 MED ORDER — CEFAZOLIN SODIUM-DEXTROSE 2-4 GM/100ML-% IV SOLN
INTRAVENOUS | Status: AC
  Filled 2019-01-05: qty 100

## 2019-01-05 MED ORDER — 0.9 % SODIUM CHLORIDE (POUR BTL) OPTIME
TOPICAL | Status: DC | PRN
  Administered 2019-01-05: 30 mL

## 2019-01-05 MED ORDER — SCOPOLAMINE 1 MG/3DAYS TD PT72: 1.0000 | MEDICATED_PATCH | Freq: Once | TRANSDERMAL | Status: DC | PRN

## 2019-01-05 MED ORDER — CEFAZOLIN SODIUM-DEXTROSE 2-4 GM/100ML-% IV SOLN
2.0000 g | INTRAVENOUS | Status: AC
  Administered 2019-01-05: 2 g via INTRAVENOUS

## 2019-01-05 MED FILL — HYDROCODON-APAP 5-325: 5-325 | 3 days supply | Qty: 20 | Fill #0

## 2019-01-05 SURGICAL SUPPLY — 60 items
BANDAGE ACE 4X5 VEL STRL LF (GAUZE/BANDAGES/DRESSINGS) ×4 IMPLANT
BANDAGE ACE 6X5 VEL STRL LF (GAUZE/BANDAGES/DRESSINGS) IMPLANT
BANDAGE ESMARK 6X9 LF (GAUZE/BANDAGES/DRESSINGS) IMPLANT
BENZOIN TINCTURE PRP APPL 2/3 (GAUZE/BANDAGES/DRESSINGS) IMPLANT
BLADE SURG 15 STRL LF DISP TIS (BLADE) ×2 IMPLANT
BLADE SURG 15 STRL SS (BLADE) ×2
BNDG COHESIVE 4X5 TAN STRL (GAUZE/BANDAGES/DRESSINGS) IMPLANT
BNDG ESMARK 4X9 LF (GAUZE/BANDAGES/DRESSINGS) ×2 IMPLANT
BNDG ESMARK 6X9 LF (GAUZE/BANDAGES/DRESSINGS)
CAP PIN PROTECTOR ORTHO WHT (CAP) ×1 IMPLANT
CHLORAPREP W/TINT 26ML (MISCELLANEOUS) ×2 IMPLANT
COVER BACK TABLE 60X90IN (DRAPES) ×2 IMPLANT
COVER WAND RF STERILE (DRAPES) IMPLANT
CUFF TOURNIQUET SINGLE 34IN LL (TOURNIQUET CUFF) IMPLANT
DECANTER SPIKE VIAL GLASS SM (MISCELLANEOUS) IMPLANT
DRAPE EXTREMITY T 121X128X90 (DISPOSABLE) ×2 IMPLANT
DRAPE IMP U-DRAPE 54X76 (DRAPES) ×2 IMPLANT
DRAPE OEC MINIVIEW 54X84 (DRAPES) ×2 IMPLANT
DRAPE U-SHAPE 47X51 STRL (DRAPES) ×2 IMPLANT
ELECT REM PT RETURN 9FT ADLT (ELECTROSURGICAL) ×2
ELECTRODE REM PT RTRN 9FT ADLT (ELECTROSURGICAL) ×1 IMPLANT
GAUZE SPONGE 4X4 12PLY STRL (GAUZE/BANDAGES/DRESSINGS) ×2 IMPLANT
GAUZE XEROFORM 1X8 LF (GAUZE/BANDAGES/DRESSINGS) ×2 IMPLANT
GLOVE BIO SURGEON STRL SZ7.5 (GLOVE) ×2 IMPLANT
GLOVE BIOGEL PI IND STRL 7.0 (GLOVE) IMPLANT
GLOVE BIOGEL PI IND STRL 8 (GLOVE) ×1 IMPLANT
GLOVE BIOGEL PI INDICATOR 7.0 (GLOVE) ×1
GLOVE BIOGEL PI INDICATOR 8 (GLOVE) ×1
GLOVE SURG SS PI 6.5 STRL IVOR (GLOVE) ×1 IMPLANT
GLOVE SURG SS PI 7.5 STRL IVOR (GLOVE) ×1 IMPLANT
GOWN STRL REUS W/ TWL LRG LVL3 (GOWN DISPOSABLE) ×1 IMPLANT
GOWN STRL REUS W/ TWL XL LVL3 (GOWN DISPOSABLE) ×1 IMPLANT
GOWN STRL REUS W/TWL LRG LVL3 (GOWN DISPOSABLE) ×2
GOWN STRL REUS W/TWL XL LVL3 (GOWN DISPOSABLE) ×1
K-WIRE .035X4 (WIRE) ×1 IMPLANT
NS IRRIG 1000ML POUR BTL (IV SOLUTION) ×2 IMPLANT
PACK BASIN DAY SURGERY FS (CUSTOM PROCEDURE TRAY) ×2 IMPLANT
PAD CAST 4YDX4 CTTN HI CHSV (CAST SUPPLIES) ×1 IMPLANT
PADDING CAST COTTON 4X4 STRL (CAST SUPPLIES) ×1
PADDING CAST SYNTHETIC 4 (CAST SUPPLIES) ×1
PADDING CAST SYNTHETIC 4X4 STR (CAST SUPPLIES) ×1 IMPLANT
PENCIL BUTTON HOLSTER BLD 10FT (ELECTRODE) ×2 IMPLANT
SLEEVE SCD COMPRESS KNEE MED (MISCELLANEOUS) ×1 IMPLANT
SPLINT FIBERGLASS 4X30 (CAST SUPPLIES) ×2 IMPLANT
SPONGE LAP 18X18 RF (DISPOSABLE) IMPLANT
STOCKINETTE 6  STRL (DRAPES) ×1
STOCKINETTE 6 STRL (DRAPES) ×1 IMPLANT
STRIP CLOSURE SKIN 1/2X4 (GAUZE/BANDAGES/DRESSINGS) IMPLANT
SUCTION FRAZIER HANDLE 10FR (MISCELLANEOUS) ×1
SUCTION TUBE FRAZIER 10FR DISP (MISCELLANEOUS) ×1 IMPLANT
SUT ETHILON 3 0 PS 1 (SUTURE) ×2 IMPLANT
SUT FIBERWIRE 2-0 18 17.9 3/8 (SUTURE)
SUT MNCRL AB 3-0 PS2 18 (SUTURE) ×2 IMPLANT
SUT PDS AB 2-0 CT2 27 (SUTURE) ×2 IMPLANT
SUT VIC AB 3-0 FS2 27 (SUTURE) IMPLANT
SUTURE FIBERWR 2-0 18 17.9 3/8 (SUTURE) IMPLANT
SYR BULB 3OZ (MISCELLANEOUS) ×2 IMPLANT
TOWEL GREEN STERILE FF (TOWEL DISPOSABLE) ×4 IMPLANT
TUBE CONNECTING 20X1/4 (TUBING) ×2 IMPLANT
UNDERPAD 30X30 (UNDERPADS AND DIAPERS) ×2 IMPLANT

## 2019-01-05 NOTE — Op Note (Signed)
Kayla CatholicSusan H Salazar female 43 y.o. 01/05/2019  PreOperative Diagnosis: Left fifth PIP joint fracture dislocation Left fifth PIP joint intra-articular loose body  PostOperative Diagnosis: Same   Procedure(s) and Anesthesia Type:    * PROXIMAL INTERPHALANGEAL FUSION (PIP) - Monitor Anesthesia Care  Surgeon: Terance Harthristopher R Elazar Argabright   Assistants: None  Anesthesia: Monitored Local Anesthesia with Sedation  Findings: Left fifth PIP joint subluxation with interposed loose body from medial collateral ligament avulsion fracture  Implants: 0.45 mm K wire  Indications:42 y.o. female stubbed her toe resulting in a left fifth PIP joint fracture dislocation.  Attempt was made at reduction however there is interposed bony fragment within the joint that would not allow for reduction.  The toe remains subluxated and valgus Lee deviated.  She was seen in clinic and given the degree of valgus deviation and subluxation of the joint was indicated for open treatment of her fracture dislocation with arthrotomy and removal of loose body and pinning of the toe.  We discussed the risk, benefits and alternatives of surgery which included wound healing complications, continued subluxation, infection, arthritis, damage to surrounding structures, need for second surgery.  After weighing these risks she wished to proceed.  Procedure Detail: Patient was seen in the preoperative holding area and the left leg was marked by myself.  Consent was marked by myself and the patient.  She was taken to the operative suite and monitored anesthesia care was administered.  A timeout was performed.  A digital block was performed of the left fifth toe using half percent Marcaine.  7 cc were used.  Then the left lower extremity was prepped and draped in the usual sterile fashion.  A second timeout was performed.  A 4 inch Esmarch was used for ankle tourniquet.  Using the C arm we took an x-ray of the fifth toe.  There was notable subluxation  laterally of the fifth toe at the PIP joint.  There was interposed bony fragment that was visible on x-ray.  I try to reduce it but this was irreducible.  Then a small transverse incision was made on the medial side overlying the PIP joint.  This taken sharply down through skin and subcutaneous tissue.  Care was taken not to violate the extensor tendon.  I was then able to enter the PIP joint through an arthrotomy through the joint capsule.  Once I entered the joint there was a bony fragment visible within the joint.  This was removed using a Freer and a curette.  I was able to mobilize the fragment out of the joint and reduce the joint appropriately.  Then holding the reduction I confirmed adequate reduction using mini C arm.  Then a 0.45 mm K wire was placed in a retrograde fashion across the DIP and PIP joints in a reduced fashion.  Then C-arm was used to confirm appropriate reduction.  Use of the Esmarch tourniquet was removed.  Then the wound was irrigated with saline.  The skin was closed with 3-0 nylon.    Xeroform, 4 x 4's, sheet cotton and Ace wrap were placed.  She will be placed in a postoperative shoe in the PACU.  Post Op Instructions: Weightbearing as tolerated in postoperative shoe.  Avoid motion at the fifth toe MTP joint Call the office with concerns Follow-up in 2 weeks for suture removal and x-rays. We will remove the pin at 4 weeks.  Estimated Blood Loss:  minimal         Drains: none  Blood Given: none         Specimens: none       Complications:  * No complications entered in OR log *         Disposition: PACU - hemodynamically stable.         Condition: stable

## 2019-01-05 NOTE — H&P (Signed)
Kayla Salazar is an 43 y.o. female.   Chief Complaint: Left fifth toe DIP avulsion fracture with subluxation and interposed loose body HPI: Kayla Salazar stubbed her fifth toe while walking barefoot and was diagnosed with an avulsion fracture.  There was an interposed loose piece of bone within the joint that made it irreducible.  She attempted taping but still had valgus deviation of the toe.  She was catching it on her sock as well as on her shoe.  It has remained painful.  She is here today for open treatment of her toe dislocation and removal of loose body.  She has no new complaints since previous visit.  Past Medical History:  Diagnosis Date  . Anemia   . Autoimmune disorder (HCC)   . 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  IVP done in 2012.  . Gastric ulcer   . Iron deficiency anemia    hx iron infusions with Dr. Myna Hidalgo  . Thyroid disease    hypothyroid    Past Surgical History:  Procedure Laterality Date  . AUGMENTATION MAMMAPLASTY  2004  . BREAST SURGERY     augmentation  . ESOPHAGEAL DILATION     Dr. Ewing Schlein.  Procedure done twice in past.   . EYE SURGERY     lasik  . LAPAROSCOPIC SUPRACERVICAL HYSTERECTOMY  07/10/2011   Procedure: LAPAROSCOPIC SUPRACERVICAL HYSTERECTOMY;  Surgeon: Melony Overly;  Location: [facility];  Service: Gynecology;  Laterality: N/A;  Laparoscopic Supracervical Hysterectomy & Cystoscopy  . right knee    . vestibuectomy  2003   Dr Edward Jolly    Family History  Problem Relation Age of Onset  . Rheum arthritis Mother   . Hypertension Mother   . Migraines Mother   . Hyperlipidemia Mother   . Thyroid disease Mother   . Thyroid disease Father   . Seizures Sister   . Hypertension Maternal Grandmother   . Hyperlipidemia Maternal Grandmother   . Stroke Maternal Grandmother   . Hypertension Paternal Grandmother   . Stroke Paternal Grandmother    Social History:  reports that she has never smoked. She has never used smokeless tobacco. She  reports that she does not drink alcohol or use drugs.  Allergies:  Allergies  Allergen Reactions  . Adhesive [Tape]   . Codeine     REACTION: Hives/itching/sweats  . Latex Itching    Medications Prior to Admission  Medication Sig Dispense Refill  . ibuprofen (ADVIL,MOTRIN) 200 MG tablet Take 200 mg by mouth every 6 (six) hours as needed for pain.    Marland Kitchen omeprazole-sodium bicarbonate (ZEGERID) 40-1100 MG per capsule Take 1 capsule by mouth daily before breakfast.    . SYNTHROID 137 MCG tablet TAKE 1 TABLET BY MOUTH DAILY BEFORE BREAKFAST. 30 tablet 6    No results found for this or any previous visit (from the past 48 hour(s)). No results found.  Review of Systems  Constitutional: Negative.   HENT: Negative.   Eyes: Negative.   Respiratory: Negative.   Cardiovascular: Negative.   Gastrointestinal: Negative.   Musculoskeletal:       Left 5th toe pain  Skin: Negative.   Neurological: Negative.   Psychiatric/Behavioral: Negative.     Blood pressure 114/75, pulse 62, temperature 98.3 F (36.8 C), temperature source Oral, resp. rate 20, height 5' 7.5" (1.715 m), weight 82.9 kg, last menstrual period 06/22/2011, SpO2 100 %. Physical Exam  Constitutional: She appears well-developed.  HENT:  Head: Normocephalic.  Eyes: Conjunctivae are normal.  Neck: Neck supple.  Cardiovascular: Normal rate.  Respiratory: Effort normal.  GI: Soft.  Musculoskeletal:     Comments: Evaluation of the left foot.  Left fifth toe has some valgus deviation.  There is tenderness to palpation over the DIP joint.  There is some swelling.  Other toes without evidence of injury.  Sensation intact about the dorsal and plantar surface of the foot.  Foot is warm and well-perfused.  Neurological: She is alert.  Skin: Skin is warm.  Psychiatric: She has a normal mood and affect.     Assessment/Plan We will plan for open treatment of her fifth toe DIP subluxation/dislocation and removal of loose body.  At  the time will place a K wire to hold the joint in place to allow it to heal in a good position.  The risk, benefits and alternatives of the surgery were discussed with her.  Risks include continued subluxation, joint instability, progression of arthritis, pin tract infection, need for second surgery, damage to structures.  Postoperatively she will be in a hard soled sandal and will be able to weight-bear as tolerated with flat-footed gait.  She understands the risk, benefits and alternatives and wishes to proceed with surgery.  Terance Hart, MD 01/05/2019, 7:10 AM

## 2019-01-05 NOTE — H&P (Signed)
Anesthesia H&P Update: History and Physical Exam reviewed; patient is OK for planned anesthetic and procedure. ? ?

## 2019-01-05 NOTE — Anesthesia Postprocedure Evaluation (Signed)
Anesthesia Post Note  Patient: Kayla Salazar  Procedure(s) Performed: PROXIMAL INTERPHALANGEAL FUSION (PIP) (Left Toe)     Patient location during evaluation: PACU Anesthesia Type: MAC Level of consciousness: awake and alert Pain management: pain level controlled Vital Signs Assessment: post-procedure vital signs reviewed and stable Respiratory status: spontaneous breathing, nonlabored ventilation and respiratory function stable Cardiovascular status: stable and blood pressure returned to baseline Postop Assessment: no apparent nausea or vomiting Anesthetic complications: no    Last Vitals:  Vitals:   01/05/19 0845 01/05/19 0912  BP: (!) 110/91 109/71  Pulse: 60 65  Resp: 17 16  Temp:  37.3 C  SpO2: 100% 100%    Last Pain:  Vitals:   01/05/19 0912  TempSrc:   PainSc: 0-No pain                 Lynda Rainwater

## 2019-01-05 NOTE — Anesthesia Preprocedure Evaluation (Signed)
Anesthesia Evaluation  Patient identified by MRN, date of birth, ID band Patient awake    Reviewed: Allergy & Precautions, H&P , NPO status , Patient's Chart, lab work & pertinent test results  Airway Mallampati: II  TM Distance: >3 FB Neck ROM: Full    Dental no notable dental hx.    Pulmonary neg pulmonary ROS,    Pulmonary exam normal breath sounds clear to auscultation       Cardiovascular negative cardio ROS Normal cardiovascular exam Rhythm:Regular Rate:Normal     Neuro/Psych negative neurological ROS  negative psych ROS   GI/Hepatic Neg liver ROS, GERD  ,  Endo/Other  Hypothyroidism   Renal/GU negative Renal ROS  negative genitourinary   Musculoskeletal negative musculoskeletal ROS (+)   Abdominal   Peds negative pediatric ROS (+)  Hematology negative hematology ROS (+)   Anesthesia Other Findings   Reproductive/Obstetrics negative OB ROS                             Anesthesia Physical  Anesthesia Plan  ASA: II  Anesthesia Plan: MAC   Post-op Pain Management:    Induction: Intravenous  PONV Risk Score and Plan: 2 and Ondansetron and Midazolam  Airway Management Planned: Simple Face Mask  Additional Equipment:   Intra-op Plan:   Post-operative Plan:   Informed Consent: I have reviewed the patients History and Physical, chart, labs and discussed the procedure including the risks, benefits and alternatives for the proposed anesthesia with the patient or authorized representative who has indicated his/her understanding and acceptance.   Dental Advisory Given  Plan Discussed with: Anesthesiologist and CRNA  Anesthesia Plan Comments:         Anesthesia Quick Evaluation

## 2019-01-05 NOTE — Transfer of Care (Signed)
Immediate Anesthesia Transfer of Care Note  Patient: Kayla Salazar  Procedure(s) Performed: PROXIMAL INTERPHALANGEAL FUSION (PIP) (Left Toe)  Patient Location: PACU  Anesthesia Type:MAC  Level of Consciousness: awake, alert  and oriented  Airway & Oxygen Therapy: Patient Spontanous Breathing and Patient connected to face mask oxygen  Post-op Assessment: Report given to RN and Post -op Vital signs reviewed and stable  Post vital signs: Reviewed and stable  Last Vitals:  Vitals Value Taken Time  BP    Temp    Pulse 66 01/05/2019  8:19 AM  Resp 15 01/05/2019  8:19 AM  SpO2 100 % 01/05/2019  8:19 AM  Vitals shown include unvalidated device data.  Last Pain:  Vitals:   01/05/19 0704  TempSrc: Oral  PainSc: 0-No pain         Complications: No apparent anesthesia complications

## 2019-01-05 NOTE — Discharge Instructions (Signed)
Keep dressing clean dry and in place until follow-up visit.  Follow up in Dr. Donnie Mesa office in 2 weeks. Call for appointment. [phone] Weight bearing as tolerated in flat shoe.  Post Anesthesia Home Care Instructions  Activity: Get plenty of rest for the remainder of the day. A responsible individual must stay with you for 24 hours following the procedure.  For the next 24 hours, DO NOT: -Drive a car -Advertising copywriter -Drink alcoholic beverages -Take any medication unless instructed by your physician -Make any legal decisions or sign important papers.  Meals: Start with liquid foods such as gelatin or soup. Progress to regular foods as tolerated. Avoid greasy, spicy, heavy foods. If nausea and/or vomiting occur, drink only clear liquids until the nausea and/or vomiting subsides. Call your physician if vomiting continues.  Special Instructions/Symptoms: Your throat may feel dry or sore from the anesthesia or the breathing tube placed in your throat during surgery. If this causes discomfort, gargle with warm salt water. The discomfort should disappear within 24 hours.  If you had a scopolamine patch placed behind your ear for the management of post- operative nausea and/or vomiting:  1. The medication in the patch is effective for 72 hours, after which it should be removed.  Wrap patch in a tissue and discard in the trash. Wash hands thoroughly with soap and water. 2. You may remove the patch earlier than 72 hours if you experience unpleasant side effects which may include dry mouth, dizziness or visual disturbances. 3. Avoid touching the patch. Wash your hands with soap and water after contact with the patch.

## 2019-01-07 ENCOUNTER — Encounter (HOSPITAL_BASED_OUTPATIENT_CLINIC_OR_DEPARTMENT_OTHER): Payer: Self-pay | Admitting: Orthopaedic Surgery

## 2019-01-19 DIAGNOSIS — M24075 Loose body in left toe joint(s): Secondary | ICD-10-CM | POA: Diagnosis not present

## 2019-02-11 DIAGNOSIS — M24075 Loose body in left toe joint(s): Secondary | ICD-10-CM | POA: Diagnosis not present

## 2019-03-11 DIAGNOSIS — M24075 Loose body in left toe joint(s): Secondary | ICD-10-CM | POA: Diagnosis not present

## 2019-03-17 MED FILL — SYNTHROID 137 MCG TABLET: 137 | 90 days supply | Qty: 90 | Fill #1

## 2019-04-21 ENCOUNTER — Ambulatory Visit (HOSPITAL_COMMUNITY)
Admission: AD | Admit: 2019-04-21 | Discharge: 2019-04-22 | Disposition: A | Discharge status: Home / Self Care | Payer: 59 | Attending: Gastroenterology | Admitting: Gastroenterology

## 2019-04-21 ENCOUNTER — Other Ambulatory Visit: Payer: Self-pay

## 2019-04-21 ENCOUNTER — Encounter (HOSPITAL_COMMUNITY): Admission: AD | Disposition: A | Discharge status: Home / Self Care | Payer: Self-pay | Attending: Gastroenterology

## 2019-04-21 ENCOUNTER — Encounter (HOSPITAL_COMMUNITY): Payer: Self-pay | Admitting: *Deleted

## 2019-04-21 DIAGNOSIS — K449 Diaphragmatic hernia without obstruction or gangrene: Secondary | ICD-10-CM | POA: Insufficient documentation

## 2019-04-21 DIAGNOSIS — K259 Gastric ulcer, unspecified as acute or chronic, without hemorrhage or perforation: Secondary | ICD-10-CM | POA: Insufficient documentation

## 2019-04-21 DIAGNOSIS — K228 Other specified diseases of esophagus: Secondary | ICD-10-CM | POA: Insufficient documentation

## 2019-04-21 DIAGNOSIS — D509 Iron deficiency anemia, unspecified: Secondary | ICD-10-CM | POA: Diagnosis not present

## 2019-04-21 DIAGNOSIS — X58XXXA Exposure to other specified factors, initial encounter: Secondary | ICD-10-CM | POA: Diagnosis not present

## 2019-04-21 DIAGNOSIS — K2950 Unspecified chronic gastritis without bleeding: Secondary | ICD-10-CM | POA: Diagnosis not present

## 2019-04-21 DIAGNOSIS — Z79890 Hormone replacement therapy (postmenopausal): Secondary | ICD-10-CM | POA: Insufficient documentation

## 2019-04-21 DIAGNOSIS — K117 Disturbances of salivary secretion: Secondary | ICD-10-CM | POA: Insufficient documentation

## 2019-04-21 DIAGNOSIS — D8989 Other specified disorders involving the immune mechanism, not elsewhere classified: Secondary | ICD-10-CM | POA: Insufficient documentation

## 2019-04-21 DIAGNOSIS — E039 Hypothyroidism, unspecified: Secondary | ICD-10-CM | POA: Diagnosis not present

## 2019-04-21 DIAGNOSIS — Z79899 Other long term (current) drug therapy: Secondary | ICD-10-CM | POA: Diagnosis not present

## 2019-04-21 DIAGNOSIS — K209 Esophagitis, unspecified: Secondary | ICD-10-CM | POA: Diagnosis not present

## 2019-04-21 DIAGNOSIS — T18108A Unspecified foreign body in esophagus causing other injury, initial encounter: Secondary | ICD-10-CM | POA: Diagnosis not present

## 2019-04-21 HISTORY — PX: BIOPSY: SHX5522

## 2019-04-21 HISTORY — PX: ESOPHAGOGASTRODUODENOSCOPY: SHX5428

## 2019-04-21 SURGERY — EGD (ESOPHAGOGASTRODUODENOSCOPY)
Anesthesia: Moderate Sedation

## 2019-04-21 MED ORDER — DIPHENHYDRAMINE HCL 50 MG/ML IJ SOLN
INTRAMUSCULAR | Status: DC | PRN
  Administered 2019-04-21: 25 mg via INTRAVENOUS

## 2019-04-21 MED ORDER — FENTANYL CITRATE (PF) 100 MCG/2ML IJ SOLN
INTRAMUSCULAR | Status: DC | PRN
  Administered 2019-04-21 (×3): 25 ug via INTRAVENOUS

## 2019-04-21 MED ORDER — SODIUM CHLORIDE 0.9 % IV SOLN
INTRAVENOUS | Status: AC | PRN
  Administered 2019-04-21: 500 mL via INTRAVENOUS

## 2019-04-21 MED ORDER — MIDAZOLAM HCL (PF) 10 MG/2ML IJ SOLN
INTRAMUSCULAR | Status: DC | PRN
  Administered 2019-04-21: 1 mg via INTRAVENOUS
  Administered 2019-04-21 (×3): 2 mg via INTRAVENOUS

## 2019-04-21 NOTE — H&P (Signed)
[facility] Consult Note  Chief Complaint: esophageal foreign body  HPI: Kayla Salazar is an 43 y.o. female with several history of esophageal foreign body.  Has sialorrhea.  Ongoing for 4-5 hours.  Happened after eating chicken for dinner.  Prior episodes, prior endoscopies in the past.  Past Medical History:  Diagnosis Date  . Anemia   . Autoimmune disorder (HCC)   . 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  IVP done in 2012.  . Gastric ulcer   . Iron deficiency anemia    hx iron infusions with Dr. Myna HidalgoEnnever  . Thyroid disease    hypothyroid    Past Surgical History:  Procedure Laterality Date  . AUGMENTATION MAMMAPLASTY  2004  . BREAST SURGERY     augmentation  . ESOPHAGEAL DILATION     Dr. Ewing SchleinMagod.  Procedure done twice in past.   . EYE SURGERY     lasik  . LAPAROSCOPIC SUPRACERVICAL HYSTERECTOMY  07/10/2011   Procedure: LAPAROSCOPIC SUPRACERVICAL HYSTERECTOMY;  Surgeon: Melony OverlyBrook A Silva;  Location: [facility];  Service: Gynecology;  Laterality: N/A;  Laparoscopic Supracervical Hysterectomy & Cystoscopy  . PROXIMAL INTERPHALANGEAL FUSION (PIP) Left 01/05/2019   Procedure: PROXIMAL INTERPHALANGEAL FUSION (PIP);  Surgeon: Terance HartAdair, Christopher R, MD;  Location: [facility];  Service: Orthopedics;  Laterality: Left;  . right knee    . vestibuectomy  2003   Dr Edward JollySilva    Medications Prior to Admission  Medication Sig Dispense Refill  . ibuprofen (ADVIL,MOTRIN) 200 MG tablet Take 200 mg by mouth every 6 (six) hours as needed for pain.    Marland Kitchen. omeprazole-sodium bicarbonate (ZEGERID) 40-1100 MG per capsule Take 1 capsule by mouth daily before breakfast.    . SYNTHROID 137 MCG tablet TAKE 1 TABLET BY MOUTH DAILY BEFORE BREAKFAST. 30 tablet 6    Allergies:  Allergies  Allergen Reactions  . Adhesive [Tape]   . Codeine     REACTION: Hives/itching/sweats  . Latex Itching    Family History  Problem Relation Age of Onset  . Rheum arthritis Mother    . Hypertension Mother   . Migraines Mother   . Hyperlipidemia Mother   . Thyroid disease Mother   . Thyroid disease Father   . Seizures Sister   . Hypertension Maternal Grandmother   . Hyperlipidemia Maternal Grandmother   . Stroke Maternal Grandmother   . Hypertension Paternal Grandmother   . Stroke Paternal Grandmother     Social History:  reports that she has never smoked. She has never used smokeless tobacco. She reports that she does not drink alcohol or use drugs.   ROS: As per HPI, all others negative   Blood pressure 130/70, pulse (!) 58, temperature 98.3 F (36.8 C), temperature source Oral, resp. rate 11, height 5\' 7"  (1.702 m), weight 78.9 kg, last menstrual period 06/22/2011, SpO2 100 %. General appearance: NAD Head: NCAT HEENT:  Anicteric, dry mucous membranes CV/LUNG:  Clear, RRR ABD:  Soft  No results found for this or any previous visit (from the past 48 hour(s)). No results found.  Assessment/Plan  1.  Suspected esophageal foreign body. 2.  Plan endoscopy for foreign body removal. 3.  Risks (bleeding, infection, bowel perforation that could require surgery, sedation-related changes in cardiopulmonary systems), benefits (identification and possible treatment of source of symptoms, exclusion of certain causes of symptoms), and alternatives (watchful waiting, radiographic imaging studies, empiric medical treatment) of upper endoscopy (EGD) were explained to patient/family in detail and patient wishes to  proceed.  Freddy Jaksch 04/21/2019, 11:25 PM

## 2019-04-21 NOTE — Op Note (Signed)
[facility] Patient Name: Kayla Salazar Procedure Date : 04/21/2019 MRN: [id number] Attending MD: Willis Modena , MD Date of Birth: 10-30-1976 CSN: [id number] Age: 43 Admit Type: Outpatient Procedure:                Upper GI endoscopy Indications:              Dysphagia, siarlorrhea, possible body in the                            esophagus Providers:                Willis Modena, MD, Zoe Lan, RN, Glory Rosebush,                            RN, Lawson Radar, Technician Referring MD:             Dr. Vida Rigger Medicines:                Diphenhydramine 25 mg IV, Fentanyl 75 micrograms                            IV, Midazolam 7 mg IV Complications:            No immediate complications. Estimated Blood Loss:     Estimated blood loss: none. Procedure:                Pre-Anesthesia Assessment:                           - Prior to the procedure, a History and Physical                            was performed, and patient medications and                            allergies were reviewed. The patient's tolerance of                            previous anesthesia was also reviewed. The risks                            and benefits of the procedure and the sedation                            options and risks were discussed with the patient.                            All questions were answered, and informed consent                            was obtained. Prior Anticoagulants: The patient has                            taken no previous anticoagulant or antiplatelet  agents. ASA Grade Assessment: II - A patient with                            mild systemic disease. After reviewing the risks                            and benefits, the patient was deemed in                            satisfactory condition to undergo the procedure.                           After obtaining informed consent, the endoscope was                            passed under  direct vision. Throughout the                            procedure, the patient's blood pressure, pulse, and                            oxygen saturations were monitored continuously. The                            GIF-H190 ([id number]) Olympus gastroscope was                            introduced through the mouth, and advanced to the                            second part of duodenum. The upper GI endoscopy was                            accomplished without difficulty. The patient                            tolerated the procedure well. Scope In: Scope Out: Findings:      A 2-3 cm hiatal hernia was present.      LA Grade C (one or more mucosal breaks continuous between tops of 2 or       more mucosal folds, less than 75% circumference) esophagitis was found.      Mucosal changes including feline appearance and longitudinal furrows       were found in the entire esophagus. Biopsies were taken with a cold       forceps for histology.      The exam of the esophagus was otherwise normal. No food in esophagus;       suspect it had been stuck at GE junction and had passed by time of       tonight's procedure.      One non-bleeding cratered gastric ulcer with no stigmata of bleeding was       found in the prepyloric region of the stomach.      Patchy moderate inflammation was found in the gastric body, in the       gastric  antrum and in the prepyloric region of the stomach. Biopsies       were taken with a cold forceps for histology.      The exam of the stomach was otherwise normal.      The duodenal bulb, first portion of the duodenum and second portion of       the duodenum were normal. Impression:               - Medium-sized hiatal hernia.                           - LA Grade C esophagitis.                           - Esophageal mucosal changes suspicious for                            eosinophilic esophagitis. Biopsied.                           - Non-bleeding gastric ulcer with no  stigmata of                            bleeding.                           - Gastritis. Biopsied.                           - Normal duodenal bulb, first portion of the                            duodenum and second portion of the duodenum. Moderate Sedation:      Moderate (conscious) sedation was administered by the endoscopy nurse       and supervised by the endoscopist. The following parameters were       monitored: oxygen saturation, heart rate, blood pressure, and response       to care. Total physician intraservice time was 14 minutes. Recommendation:           - Patient has a contact number available for                            emergencies. The signs and symptoms of potential                            delayed complications were discussed with the                            patient. Return to normal activities tomorrow.                            Written discharge instructions were provided to the                            patient.                           -  Discharge patient to home (with spouse).                           - Pureed diet today.                           - Continue present medications.                           - Await pathology results.                           - Use Prilosec (omeprazole) 20 mg PO daily until                            further notice.                           - Minimize ASA/NSAIDs until further notice.                           - Return to GI clinic after studies are complete. Procedure Code(s):        --- Professional ---                           [id number], Esophagogastroduodenoscopy, flexible,                            transoral; with biopsy, single or multiple Diagnosis Code(s):        --- Professional ---                           K44.9, Diaphragmatic hernia without obstruction or                            gangrene                           K20.9, Esophagitis, unspecified                           K22.8, Other specified diseases of  esophagus                           K25.9, Gastric ulcer, unspecified as acute or                            chronic, without hemorrhage or perforation                           K29.70, Gastritis, unspecified, without bleeding                           R13.10, Dysphagia, unspecified                           T18.108A, Unspecified foreign body in esophagus  causing other injury, initial encounter CPT copyright 2019 [employer] Association. All rights reserved. The codes documented in this report are preliminary and upon coder review may  be revised to meet current compliance requirements. Willis Modena, MD 04/21/2019 11:51:23 PM This report has been signed electronically. Number of Addenda: 0

## 2019-04-22 DIAGNOSIS — T18108A Unspecified foreign body in esophagus causing other injury, initial encounter: Secondary | ICD-10-CM | POA: Diagnosis not present

## 2019-04-22 DIAGNOSIS — D509 Iron deficiency anemia, unspecified: Secondary | ICD-10-CM | POA: Diagnosis not present

## 2019-04-22 DIAGNOSIS — K209 Esophagitis, unspecified: Secondary | ICD-10-CM | POA: Diagnosis not present

## 2019-04-22 DIAGNOSIS — K449 Diaphragmatic hernia without obstruction or gangrene: Secondary | ICD-10-CM | POA: Diagnosis not present

## 2019-04-22 DIAGNOSIS — E039 Hypothyroidism, unspecified: Secondary | ICD-10-CM | POA: Diagnosis not present

## 2019-04-22 DIAGNOSIS — K117 Disturbances of salivary secretion: Secondary | ICD-10-CM | POA: Diagnosis not present

## 2019-04-22 DIAGNOSIS — K2950 Unspecified chronic gastritis without bleeding: Secondary | ICD-10-CM | POA: Diagnosis not present

## 2019-04-22 DIAGNOSIS — K259 Gastric ulcer, unspecified as acute or chronic, without hemorrhage or perforation: Secondary | ICD-10-CM | POA: Diagnosis not present

## 2019-04-22 DIAGNOSIS — K228 Other specified diseases of esophagus: Secondary | ICD-10-CM | POA: Diagnosis not present

## 2019-04-22 MED ORDER — MIDAZOLAM HCL (PF) 5 MG/ML IJ SOLN
INTRAMUSCULAR | Status: AC
  Filled 2019-04-22: qty 2

## 2019-04-22 MED ORDER — DIPHENHYDRAMINE HCL 50 MG/ML IJ SOLN
INTRAMUSCULAR | Status: AC
  Filled 2019-04-22: qty 1

## 2019-04-22 MED ORDER — FENTANYL CITRATE (PF) 100 MCG/2ML IJ SOLN
INTRAMUSCULAR | Status: AC
  Filled 2019-04-22: qty 2

## 2019-04-22 NOTE — Discharge Instructions (Signed)

## 2019-04-23 ENCOUNTER — Encounter (HOSPITAL_COMMUNITY): Payer: Self-pay | Admitting: Gastroenterology

## 2019-04-28 DIAGNOSIS — K228 Other specified diseases of esophagus: Secondary | ICD-10-CM | POA: Diagnosis not present

## 2019-04-28 DIAGNOSIS — K209 Esophagitis, unspecified: Secondary | ICD-10-CM | POA: Diagnosis not present

## 2019-04-28 DIAGNOSIS — K259 Gastric ulcer, unspecified as acute or chronic, without hemorrhage or perforation: Secondary | ICD-10-CM | POA: Diagnosis not present

## 2019-04-28 DIAGNOSIS — R1310 Dysphagia, unspecified: Secondary | ICD-10-CM | POA: Diagnosis not present

## 2019-04-28 DIAGNOSIS — K2970 Gastritis, unspecified, without bleeding: Secondary | ICD-10-CM | POA: Diagnosis not present

## 2019-04-28 DIAGNOSIS — K449 Diaphragmatic hernia without obstruction or gangrene: Secondary | ICD-10-CM | POA: Diagnosis not present

## 2019-06-04 ENCOUNTER — Telehealth: Payer: Self-pay | Admitting: Obstetrics and Gynecology

## 2019-06-04 ENCOUNTER — Other Ambulatory Visit: Payer: Self-pay | Admitting: Obstetrics and Gynecology

## 2019-06-04 ENCOUNTER — Encounter: Payer: Self-pay | Admitting: Obstetrics and Gynecology

## 2019-06-04 DIAGNOSIS — Z0000 Encounter for general adult medical examination without abnormal findings: Secondary | ICD-10-CM

## 2019-06-04 NOTE — Telephone Encounter (Signed)
Patient is calling requesting to get complete panel of labs drawn day before her aex on 07/23/19. Patient scheduled fasting lab appointment for 07/22/19 at 8:30am.

## 2019-06-04 NOTE — Telephone Encounter (Signed)
Call to patient regarding cancelled appointment. No answer and no voicemail. Mail letter.

## 2019-06-04 NOTE — Telephone Encounter (Signed)
Patient's appointment was rescheduled from early on a Friday morning to a Thursday afternoon 07-23-19 for [facility]. She usually does fasting labs at [facility]. She would like to come in the morning before and have her labs drawn. Will discuss with provider which labs to have drawn/ordered. (we do not need to call patient back unless we aren't going to do labs day before). Routed to provider.

## 2019-06-04 NOTE — Telephone Encounter (Signed)
Future labs ordered and encounter closed.

## 2019-06-09 MED FILL — OMEPRAZOLE 40 MG CPDR: 40 | 90 days supply | Qty: 90 | Fill #0

## 2019-06-22 MED FILL — SYNTHROID 137 MCG TABLET: 137 | 30 days supply | Qty: 30 | Fill #2

## 2019-07-10 DIAGNOSIS — R1311 Dysphagia, oral phase: Secondary | ICD-10-CM | POA: Diagnosis not present

## 2019-07-10 DIAGNOSIS — Z8371 Family history of colonic polyps: Secondary | ICD-10-CM | POA: Diagnosis not present

## 2019-07-17 ENCOUNTER — Ambulatory Visit: Payer: 59 | Admitting: Obstetrics and Gynecology

## 2019-07-20 ENCOUNTER — Telehealth: Payer: Self-pay | Admitting: Obstetrics and Gynecology

## 2019-07-20 ENCOUNTER — Other Ambulatory Visit: Payer: Self-pay | Admitting: Obstetrics and Gynecology

## 2019-07-20 DIAGNOSIS — M24075 Loose body in left toe joint(s): Secondary | ICD-10-CM | POA: Diagnosis not present

## 2019-07-20 DIAGNOSIS — Z1231 Encounter for screening mammogram for malignant neoplasm of breast: Secondary | ICD-10-CM

## 2019-07-20 NOTE — Telephone Encounter (Signed)
Thank you for the update!

## 2019-07-20 NOTE — Telephone Encounter (Signed)
Patient canceled her upcoming lab appointment 7/22/20via the automated reminder call. I left her a message to call and reschedule.

## 2019-07-21 ENCOUNTER — Other Ambulatory Visit: Payer: Self-pay

## 2019-07-21 ENCOUNTER — Telehealth: Payer: Self-pay | Admitting: Obstetrics and Gynecology

## 2019-07-21 NOTE — Telephone Encounter (Signed)
I recommend she come in closer to the time of her annual exam.  Her labs could be done on the same day as her visit in September.

## 2019-07-21 NOTE — Telephone Encounter (Signed)
Call returned to patient, left detailed message, ok per dpr. Advised as seen below per Dr. Quincy Simmonds. Advised I have canceled lab appt scheduled for 7/22 at 8:45am. May return call to office to reschedule closer to AEX or wait until AEX.   Encounter closed.

## 2019-07-21 NOTE — Telephone Encounter (Signed)
Dr. Quincy Simmonds -ok to proceed with [facility]EX early?

## 2019-07-21 NOTE — Telephone Encounter (Signed)
Patient rescheduled her annual exam scheduled for 07/23/19. Would like to know if she is still okay to come in for her labs tomorrow morning or is that too far before her annual exam?

## 2019-07-22 ENCOUNTER — Other Ambulatory Visit: Payer: 59

## 2019-07-22 NOTE — Telephone Encounter (Signed)
Patient left voicemail over lunch to reschedule lab appointment but has questions regarding when they need to be done.

## 2019-07-22 NOTE — Telephone Encounter (Signed)
Call to patient. Advised Dr Quincy Simmonds recommends labs on same day as annual in September.  Encounter closed.

## 2019-07-23 ENCOUNTER — Ambulatory Visit: Payer: 59 | Admitting: Obstetrics and Gynecology

## 2019-07-28 MED FILL — SYNTHROID 137 MCG TABLET: 137 | 30 days supply | Qty: 30 | Fill #1

## 2019-07-28 MED FILL — OMEPRAZOLE 40 MG CPDR: 40 | 90 days supply | Qty: 90 | Fill #0

## 2019-08-17 NOTE — Progress Notes (Signed)
43 y.o. G41P2012 Married Caucasian female here for annual exam.    Feeling good overall.   PCP:   None  Patient's last menstrual period was 06/22/2011.           Sexually active: Yes.    The current method of family planning is status post hysterectomy.    Exercising: Yes.    push mowing, walking Smoker:  no  Health Maintenance: Pap: 06-24-18 Neg:Neg HR HPV, 10-15-16 Neg:Neg HR HPV History of abnormal Pap:  no MMG: 06-04-18 3D/Implants/Neg/density D/BiRads1--has appt. 09-03-19 Colonoscopy: 2010;next due 10/2019 BMD:   n/a  Result  n/a TDaP:  Unsure.  Know she had a tetanus vaccine in 1994. She has stated at her visit on 06/14/14 she is up to date through work.  Gardasil:   N/A YQI:HKVQQV pregnancy Hep C:during pregnancy Screening Labs:  Today.    reports that she has never smoked. She has never used smokeless tobacco. She reports that she does not drink alcohol or use drugs.  Past Medical History:  Diagnosis Date  . Anemia   . Autoimmune disorder ([place])   . 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  IVP done in 2012.  . Gastric ulcer   . Iron deficiency anemia    hx iron infusions with Dr. Marin Olp  . Thyroid disease    hypothyroid    Past Surgical History:  Procedure Laterality Date  . AUGMENTATION MAMMAPLASTY  2004  . BIOPSY  04/21/2019   Procedure: BIOPSY;  Surgeon: Arta Silence, MD;  Location: [facility];  Service: Endoscopy;;  . BREAST SURGERY     augmentation  . ESOPHAGEAL DILATION     Dr. Watt Climes.  Procedure done twice in past.   . ESOPHAGOGASTRODUODENOSCOPY N/A 04/21/2019   Procedure: ESOPHAGOGASTRODUODENOSCOPY (EGD);  Surgeon: Arta Silence, MD;  Location: [facility] ENDOSCOPY;  Service: Endoscopy;  Laterality: N/A;  . EYE SURGERY     lasik  . FOOT FRACTURE SURGERY Left    2020  . LAPAROSCOPIC SUPRACERVICAL HYSTERECTOMY  07/10/2011   Procedure: LAPAROSCOPIC SUPRACERVICAL HYSTERECTOMY;  Surgeon: Arloa Koh;  Location: [facility];  Service:  Gynecology;  Laterality: N/A;  Laparoscopic Supracervical Hysterectomy & Cystoscopy  . PROXIMAL INTERPHALANGEAL FUSION (PIP) Left 01/05/2019   Procedure: PROXIMAL INTERPHALANGEAL FUSION (PIP);  Surgeon: Erle Crocker, MD;  Location: [facility];  Service: Orthopedics;  Laterality: Left;  . right knee    . vestibuectomy  2003   Dr Quincy Simmonds    Current Outpatient Medications  Medication Sig Dispense Refill  . Calcium Carb-Cholecalciferol (CALCIUM 500+D3) 500-400 MG-UNIT TABS Take 1 tablet by mouth daily.    Marland Kitchen levothyroxine (SYNTHROID) 175 MCG tablet Take 1 tablet by mouth daily.    Marland Kitchen omeprazole (PRILOSEC) 40 MG capsule Take 1 capsule by mouth daily.     No current facility-administered medications for this visit.     Family History  Problem Relation Age of Onset  . Rheum arthritis Mother   . Hypertension Mother   . Migraines Mother   . Hyperlipidemia Mother   . Thyroid disease Mother   . Thyroid disease Father   . Seizures Sister   . Hypertension Maternal Grandmother   . Hyperlipidemia Maternal Grandmother   . Stroke Maternal Grandmother   . Hypertension Paternal Grandmother   . Stroke Paternal Grandmother     Review of Systems  All other systems reviewed and are negative.   Exam:   BP 120/74   Pulse (!) 56   Temp (!) 97.5  F (36.4 C)   Resp 16   Ht 5\' 7"  (1.702 m)   Wt 160 lb 6.4 oz (72.8 kg)   LMP 06/22/2011   BMI 25.12 kg/m     General appearance: alert, cooperative and appears stated age Head: normocephalic, without obvious abnormality, atraumatic Neck: no adenopathy, supple, symmetrical, trachea midline and thyroid normal to inspection and palpation Lungs: clear to auscultation bilaterally Breasts: right - normal appearance, 3 cm mass and mild tenderness at 9:00, Implant palpable separate from the mass. No nipple retraction or dimpling, No nipple discharge or bleeding, No axillary adenopathy Left - Implant palpable.  No nipple retraction or  dimpling, No nipple discharge or bleeding, No axillary adenopathy Heart: regular rate and rhythm Abdomen: soft, non-tender; no masses, no organomegaly Extremities: extremities normal, atraumatic, no cyanosis or edema Skin: skin color, texture, turgor normal. No rashes or lesions Lymph nodes: cervical, supraclavicular, and axillary nodes normal. Neurologic: grossly normal  Pelvic: External genitalia:  no lesions              No abnormal inguinal nodes palpated.              Urethra:  normal appearing urethra with no masses, tenderness or lesions              Bartholins and Skenes: normal                 Vagina: normal appearing vagina with normal color and discharge, no lesions              Cervix: no lesions              Pap taken: No. Bimanual Exam:  Uterus:  absent              Adnexa: no mass, fullness, tenderness              Rectal exam: Yes.  .  Confirms.              Anus:  normal sphincter tone, no lesions  Chaperone was present for exam.  Assessment:   Well woman visit with normal exam. Status post supracervical hysterectomy.  Right breast mass.  Bilateral breast implants.  Hx anemia. Hypothyroidism.  Plan: Mammogram - will schedule bilateral dx and right breast US.  Self breast awareness reviewed. Pap and HR HPV as above. Guidelines for Calcium, Vitamin D, regular exercise program including cardiovascular and weight bearing exercise. Routine labs including TFTs.   Refill of Synthroid for one year.  Follow up annually and prn.   After visit summary provided.

## 2019-08-18 ENCOUNTER — Encounter: Payer: Self-pay | Admitting: Obstetrics and Gynecology

## 2019-08-18 ENCOUNTER — Telehealth: Payer: Self-pay | Admitting: Obstetrics and Gynecology

## 2019-08-18 ENCOUNTER — Other Ambulatory Visit: Payer: Self-pay

## 2019-08-18 ENCOUNTER — Ambulatory Visit (INDEPENDENT_AMBULATORY_CARE_PROVIDER_SITE_OTHER): Payer: 59 | Admitting: Obstetrics and Gynecology

## 2019-08-18 ENCOUNTER — Other Ambulatory Visit: Payer: Self-pay | Admitting: Obstetrics and Gynecology

## 2019-08-18 VITALS — BP 120/74 | HR 56 | Temp 97.5°F | Resp 16 | Ht 67.0 in | Wt 160.4 lb

## 2019-08-18 DIAGNOSIS — N6310 Unspecified lump in the right breast, unspecified quadrant: Secondary | ICD-10-CM

## 2019-08-18 DIAGNOSIS — Z01419 Encounter for gynecological examination (general) (routine) without abnormal findings: Secondary | ICD-10-CM

## 2019-08-18 MED ORDER — LEVOTHYROXINE SODIUM 175 MCG PO TABS: 175.0000 ug | ORAL_TABLET | Freq: Every day | ORAL | 3 refills | Status: DC

## 2019-08-18 MED FILL — SYNTHROID 175 MCG TABLET: 175 | 90 days supply | Qty: 90 | Fill #0

## 2019-08-18 NOTE — Telephone Encounter (Signed)
Please contact patient to change her screening mammogram to a bilateral diagnostic mammogram and [facility]  She has a 3 cm smooth mass at 9:00 on the right breast.  She has bilateral saline implants.

## 2019-08-18 NOTE — Telephone Encounter (Signed)
Call to the [facility]. Patient scheduled for Diagnostic MMG and U/S on 08-27-2019 at 1130.   Returned call to patient. Updated of appointment as above. Advised patient that the [facility] had openings for tomorrow am if she chose to be seen sooner. Telephone number provided for patient and advised patient to ask to speak to [facility] if she chose to call and change appointment. Patient verbalized understanding and agreeable.   Routing to provider and will close encounter.

## 2019-08-18 NOTE — Patient Instructions (Signed)

## 2019-08-19 ENCOUNTER — Ambulatory Visit
Admission: RE | Admit: 2019-08-19 | Discharge: 2019-08-19 | Disposition: A | Discharge status: Home / Self Care | Payer: 59 | Source: Ambulatory Visit | Attending: Obstetrics and Gynecology | Admitting: Obstetrics and Gynecology

## 2019-08-19 ENCOUNTER — Other Ambulatory Visit: Payer: Self-pay | Admitting: Obstetrics and Gynecology

## 2019-08-19 ENCOUNTER — Telehealth: Payer: Self-pay | Admitting: Obstetrics and Gynecology

## 2019-08-19 DIAGNOSIS — N6310 Unspecified lump in the right breast, unspecified quadrant: Secondary | ICD-10-CM

## 2019-08-19 DIAGNOSIS — N6320 Unspecified lump in the left breast, unspecified quadrant: Secondary | ICD-10-CM

## 2019-08-19 DIAGNOSIS — N6489 Other specified disorders of breast: Secondary | ICD-10-CM | POA: Diagnosis not present

## 2019-08-19 DIAGNOSIS — Z9882 Breast implant status: Secondary | ICD-10-CM

## 2019-08-19 DIAGNOSIS — R922 Inconclusive mammogram: Secondary | ICD-10-CM

## 2019-08-19 DIAGNOSIS — R928 Other abnormal and inconclusive findings on diagnostic imaging of breast: Secondary | ICD-10-CM | POA: Diagnosis not present

## 2019-08-19 LAB — LIPID PANEL
Chol/HDL Ratio: 3.2 ratio (ref 0.0–4.4)
Cholesterol, Total: 162 mg/dL (ref 100–199)
HDL: 50 mg/dL (ref >39)
LDL Calculated: 96 mg/dL (ref 0–99)
Triglycerides: 82 mg/dL (ref 0–149)
VLDL Cholesterol Cal: 16 mg/dL (ref 5–40)

## 2019-08-19 LAB — COMPREHENSIVE METABOLIC PANEL
ALT: 16 IU/L (ref 0–32)
AST: 20 IU/L (ref 0–40)
Albumin/Globulin Ratio: 1.7 (ref 1.2–2.2)
Albumin: 4.3 g/dL (ref 3.8–4.8)
Alkaline Phosphatase: 83 IU/L (ref 39–117)
BUN/Creatinine Ratio: 14 (ref 9–23)
BUN: 8 mg/dL (ref 6–24)
Bilirubin Total: 0.4 mg/dL (ref 0.0–1.2)
CO2: 25 mmol/L (ref 20–29)
Calcium: 8.9 mg/dL (ref 8.7–10.2)
Chloride: 100 mmol/L (ref 96–106)
Creatinine, Ser: 0.58 mg/dL (ref 0.57–1.00)
GFR calc Af Amer: 130 mL/min/{1.73_m2} (ref >59)
GFR calc non Af Amer: 112 mL/min/{1.73_m2} (ref >59)
Globulin, Total: 2.6 g/dL (ref 1.5–4.5)
Glucose: 76 mg/dL (ref 65–99)
Potassium: 3.9 mmol/L (ref 3.5–5.2)
Sodium: 138 mmol/L (ref 134–144)
Total Protein: 6.9 g/dL (ref 6.0–8.5)

## 2019-08-19 LAB — CBC
Hematocrit: 37.5 % (ref 34.0–46.6)
Hemoglobin: 12.1 g/dL (ref 11.1–15.9)
MCH: 28.7 pg (ref 26.6–33.0)
MCHC: 32.3 g/dL (ref 31.5–35.7)
MCV: 89 fL (ref 79–97)
Platelets: 222 10*3/uL (ref 150–450)
RBC: 4.21 x10E6/uL (ref 3.77–5.28)
RDW: 11.9 % (ref 11.7–15.4)
WBC: 5.7 10*3/uL (ref 3.4–10.8)

## 2019-08-19 LAB — T4, FREE: Free T4: 1.48 ng/dL (ref 0.82–1.77)

## 2019-08-19 LAB — TSH: TSH: 3.22 u[IU]/mL (ref 0.450–4.500)

## 2019-08-19 LAB — VITAMIN D 25 HYDROXY (VIT D DEFICIENCY, FRACTURES): Vit D, 25-Hydroxy: 29.7 ng/mL — ABNORMAL LOW (ref 30.0–100.0)

## 2019-08-19 NOTE — Telephone Encounter (Signed)
Return call to patient. Requesting Dr Quincy Simmonds order breast MRI based on mammogram result from this am.  Advised provider will review results and request and we will call her back.  Imaging result is available in Epic.

## 2019-08-19 NOTE — Telephone Encounter (Signed)
Patient had mammogram this morning and there was a suspicious finding on her left breast. She would prefer to have Kayla Salazar send in an order for MRI rather than wait 6 months for another mammogram. Since she is not considered high risk, she is aware that insurance may not cover the full MRI. She prefers to have the full done over the abbreviated MRI as long as it will be covered.

## 2019-08-20 NOTE — Telephone Encounter (Signed)
I have reviewed her diagnostic imaging reports.  [place] for breast MRI with contrast.  Please place order and send for precert.

## 2019-08-21 NOTE — Telephone Encounter (Signed)
Order placed for bilateral breast MRI w/wo contrast at [facility]. Dx: left breast mass, dense breast, h/o bilateral breast implants  Call placed to patient, advised as seen above. Advised patient [facility] will contact her directly to schedule MRI, once scheduled our office will precert. Our office willl notify if not approved. Patient verbalizes understanding and is agreeable.   Routing to provider for final review. Patient is agreeable to disposition. Will close encounter.  Cc: Lerry Liner, Magdalene Patricia

## 2019-08-27 ENCOUNTER — Other Ambulatory Visit: Payer: 59

## 2019-09-03 ENCOUNTER — Ambulatory Visit: Payer: 59

## 2019-09-17 ENCOUNTER — Ambulatory Visit
Admission: RE | Admit: 2019-09-17 | Discharge: 2019-09-17 | Disposition: A | Discharge status: Home / Self Care | Payer: 59 | Source: Ambulatory Visit | Attending: Obstetrics and Gynecology | Admitting: Obstetrics and Gynecology

## 2019-09-17 DIAGNOSIS — Z9882 Breast implant status: Secondary | ICD-10-CM

## 2019-09-17 DIAGNOSIS — N6320 Unspecified lump in the left breast, unspecified quadrant: Secondary | ICD-10-CM

## 2019-09-17 DIAGNOSIS — N6323 Unspecified lump in the left breast, lower outer quadrant: Secondary | ICD-10-CM | POA: Diagnosis not present

## 2019-09-17 DIAGNOSIS — R922 Inconclusive mammogram: Secondary | ICD-10-CM

## 2019-09-17 MED ORDER — GADOBUTROL 1 MMOL/ML IV SOLN
7.0000 mL | Freq: Once | INTRAVENOUS | Status: AC | PRN
  Administered 2019-09-17: 7 mL via INTRAVENOUS

## 2019-09-18 ENCOUNTER — Telehealth: Payer: Self-pay | Admitting: Obstetrics and Gynecology

## 2019-09-18 DIAGNOSIS — Z9882 Breast implant status: Secondary | ICD-10-CM

## 2019-09-18 DIAGNOSIS — R922 Inconclusive mammogram: Secondary | ICD-10-CM

## 2019-09-18 DIAGNOSIS — N6320 Unspecified lump in the left breast, unspecified quadrant: Secondary | ICD-10-CM

## 2019-09-18 MED FILL — SYNTHROID 175 MCG TABLET: 175 | 90 days supply | Qty: 90 | Fill #0

## 2019-09-18 NOTE — Telephone Encounter (Signed)
Call to patient. Advised MD will review and we will call back as soon as results available.

## 2019-09-18 NOTE — Telephone Encounter (Signed)
Calling for results of MRI

## 2019-09-21 NOTE — Telephone Encounter (Signed)
Please see result note 

## 2019-09-21 NOTE — Telephone Encounter (Signed)
Spoke with patient, advised per Dr. Quincy Simmonds. Patient verbalizes understanding and is agreeable. Future orders placed for left breast US and Breast MRI 02/2020. Patient will contact [facility] to schedule.   04 recall placed Removed from MMG hold.   Routing to provider for final review. Patient is agreeable to disposition. Will close encounter.   Cc: Lerry Liner

## 2019-09-21 NOTE — Telephone Encounter (Signed)
-----   Message from Nunzio Cobbs, MD sent at 09/21/2019  9:06 AM EDT ----- Please contact patient with results of breast MRI.  See also phone note from patient requesting results.  She has an enhancing area of the left breast which correlates with her recent breast US.  No abnormal nodes were seen.  The final result states probable benign findings.   The radiologist is recommending left breast US and breast MRI in 6 months.  Please place order for future breast MRI and place in recall for March 2021.

## 2019-09-22 ENCOUNTER — Encounter

## 2019-09-22 ENCOUNTER — Ambulatory Visit: Payer: 59 | Admitting: Obstetrics and Gynecology

## 2019-12-01 DIAGNOSIS — H524 Presbyopia: Secondary | ICD-10-CM | POA: Diagnosis not present

## 2019-12-02 DIAGNOSIS — Z1159 Encounter for screening for other viral diseases: Secondary | ICD-10-CM | POA: Diagnosis not present

## 2019-12-03 MED FILL — GAVILYTE-G SOLUTION: 236 | 1 days supply | Qty: 4000 | Fill #0

## 2019-12-07 DIAGNOSIS — K222 Esophageal obstruction: Secondary | ICD-10-CM | POA: Diagnosis not present

## 2019-12-07 DIAGNOSIS — K2930 Chronic superficial gastritis without bleeding: Secondary | ICD-10-CM | POA: Diagnosis not present

## 2019-12-07 DIAGNOSIS — K259 Gastric ulcer, unspecified as acute or chronic, without hemorrhage or perforation: Secondary | ICD-10-CM | POA: Diagnosis not present

## 2019-12-07 DIAGNOSIS — K621 Rectal polyp: Secondary | ICD-10-CM | POA: Diagnosis not present

## 2019-12-07 DIAGNOSIS — R1310 Dysphagia, unspecified: Secondary | ICD-10-CM | POA: Diagnosis not present

## 2019-12-07 DIAGNOSIS — K5730 Diverticulosis of large intestine without perforation or abscess without bleeding: Secondary | ICD-10-CM | POA: Diagnosis not present

## 2019-12-07 DIAGNOSIS — Z1211 Encounter for screening for malignant neoplasm of colon: Secondary | ICD-10-CM | POA: Diagnosis not present

## 2019-12-07 DIAGNOSIS — Z8371 Family history of colonic polyps: Secondary | ICD-10-CM | POA: Diagnosis not present

## 2019-12-08 ENCOUNTER — Ambulatory Visit
Admission: RE | Admit: 2019-12-08 | Discharge: 2019-12-08 | Disposition: A | Discharge status: Home / Self Care | Payer: 59 | Source: Ambulatory Visit | Attending: Gastroenterology | Admitting: Gastroenterology

## 2019-12-08 ENCOUNTER — Other Ambulatory Visit: Payer: Self-pay | Admitting: Gastroenterology

## 2019-12-08 DIAGNOSIS — K228 Other specified diseases of esophagus: Secondary | ICD-10-CM | POA: Diagnosis not present

## 2019-12-08 DIAGNOSIS — R1310 Dysphagia, unspecified: Secondary | ICD-10-CM

## 2019-12-08 DIAGNOSIS — R0789 Other chest pain: Secondary | ICD-10-CM

## 2019-12-21 IMAGING — MG DIGITAL SCREENING BILATERAL MAMMOGRAM WITH IMPLANTS, CAD AND TOM
8 of 12 series · 8 of 28 positions shown · non-contrast
Comparison: Previous exam(s).

CLINICAL DATA: Screening.

EXAM:
DIGITAL SCREENING BILATERAL MAMMOGRAM WITH IMPLANTS, CAD AND TOMO
The patient has retropectoral implants. Standard and implant
displaced views were performed.

[R CC]
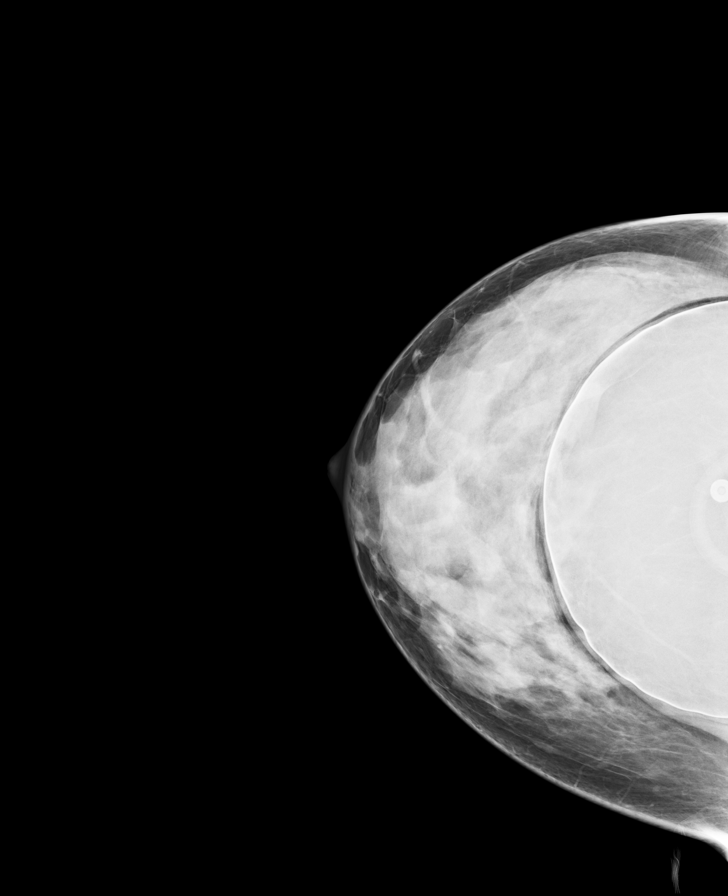

[L CC]
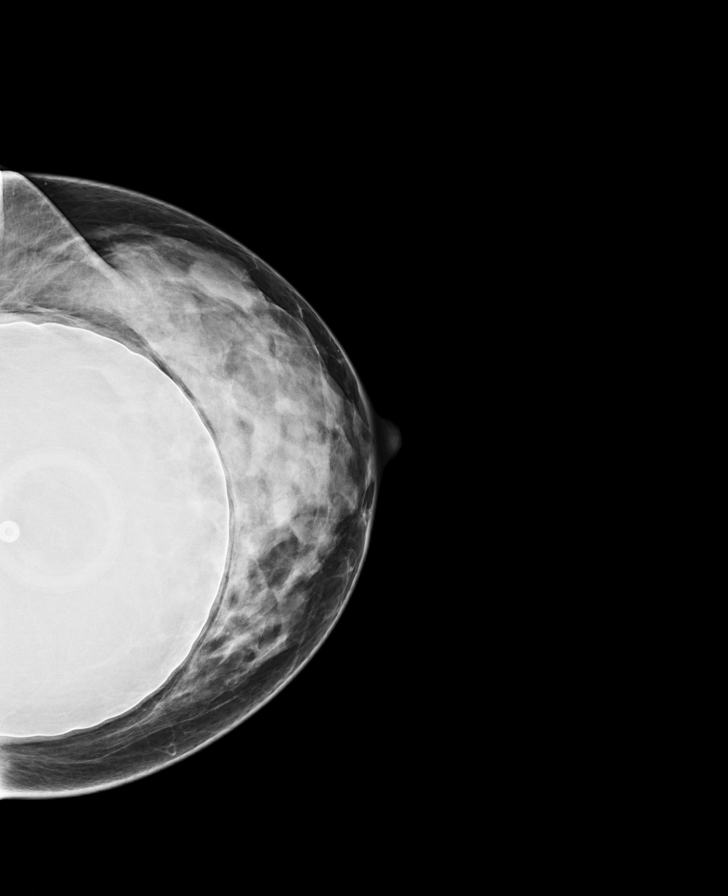

[L MLO]
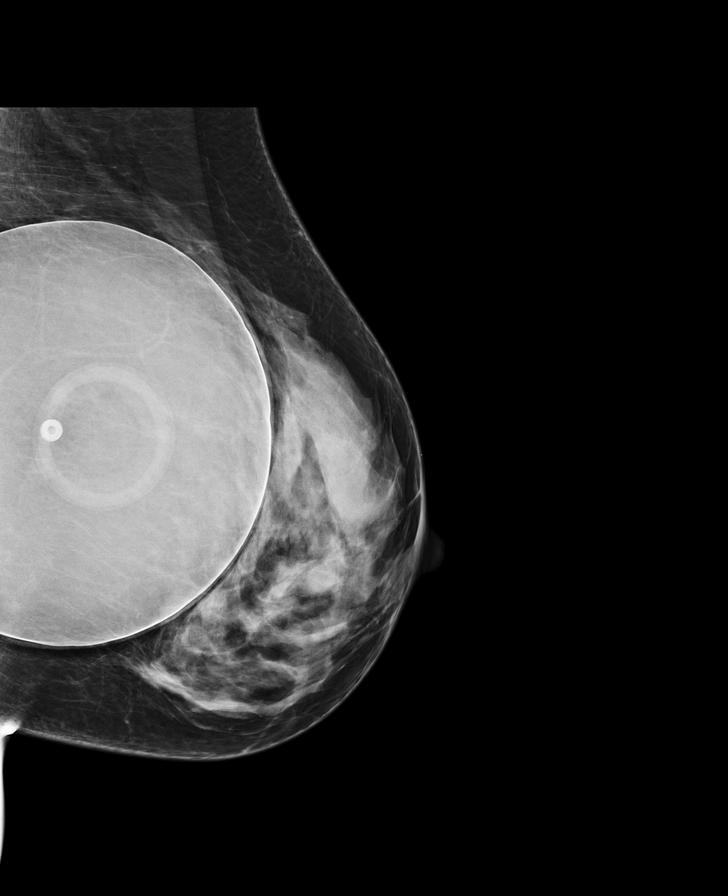

[R MLO]
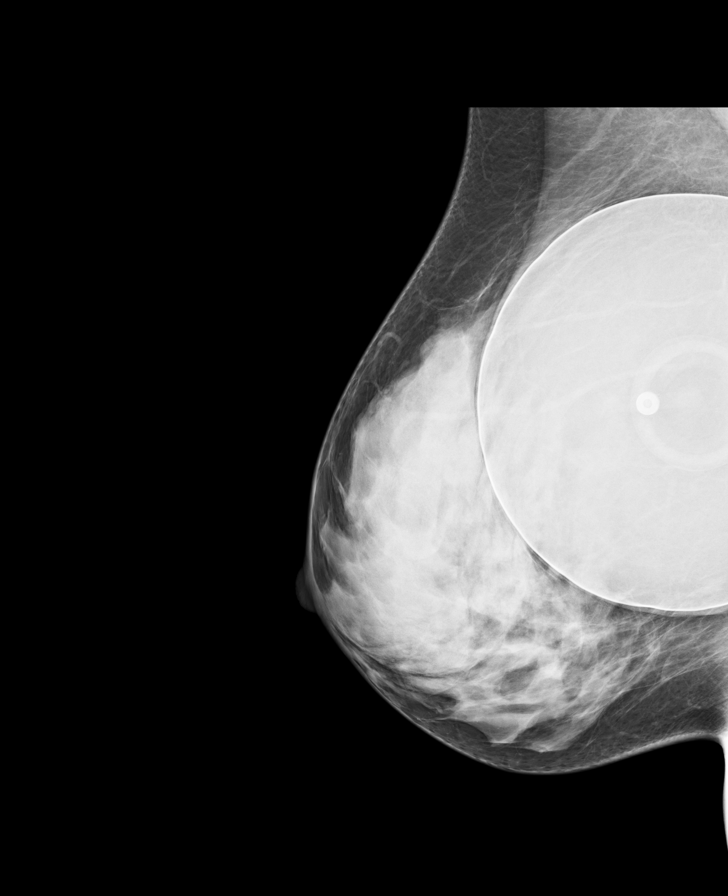

[L MLO synth-2D]
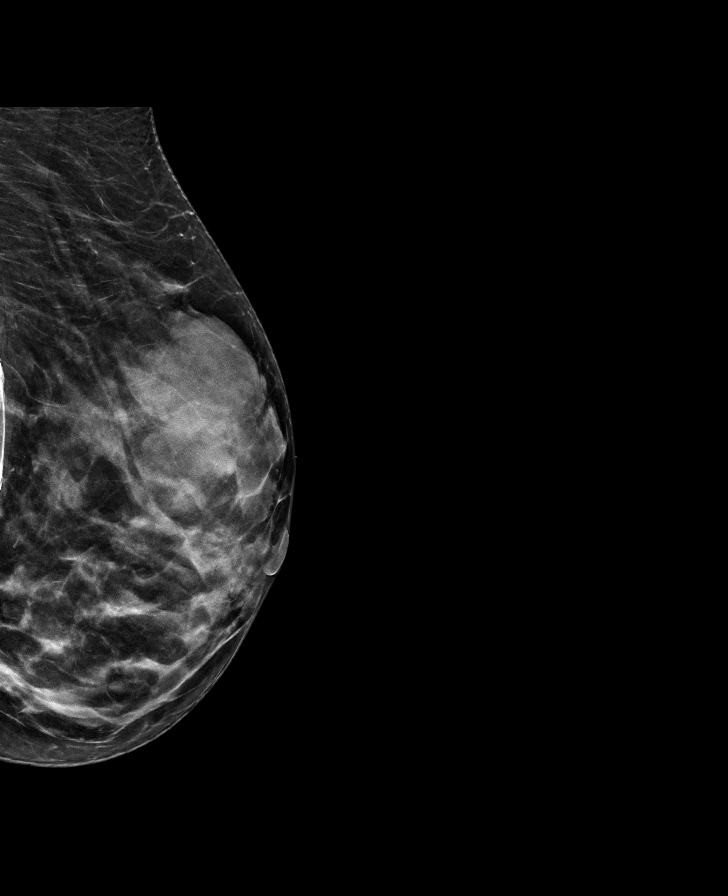

[L CC synth-2D]
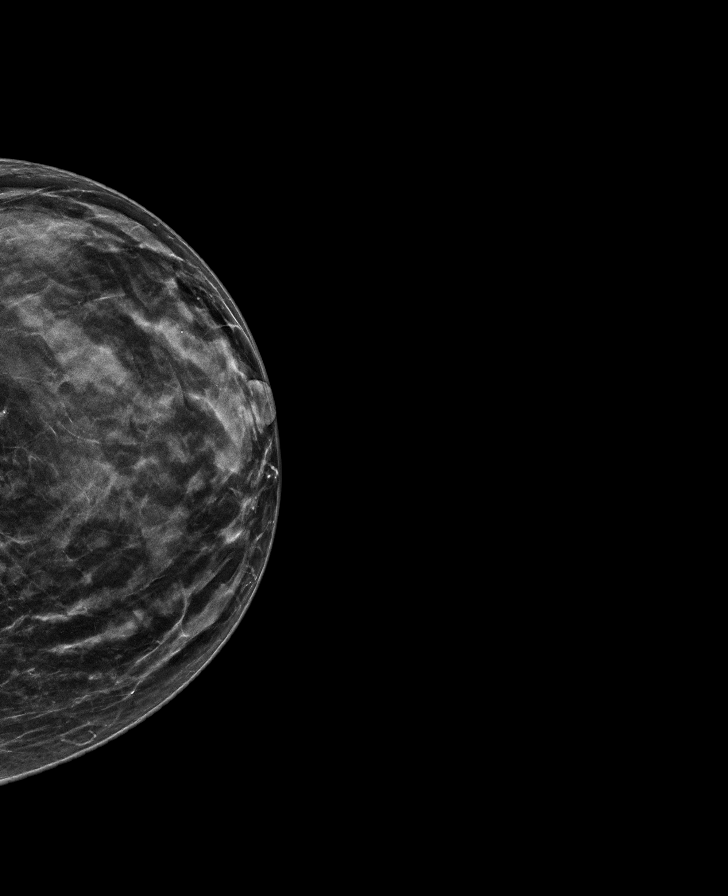

[R MLO synth-2D]
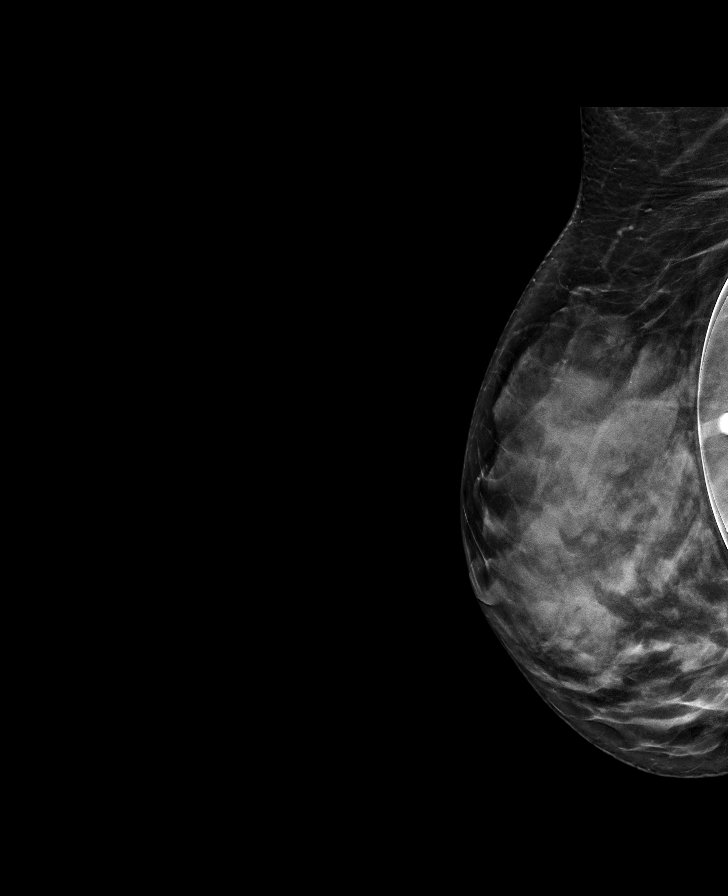

[R CC synth-2D]
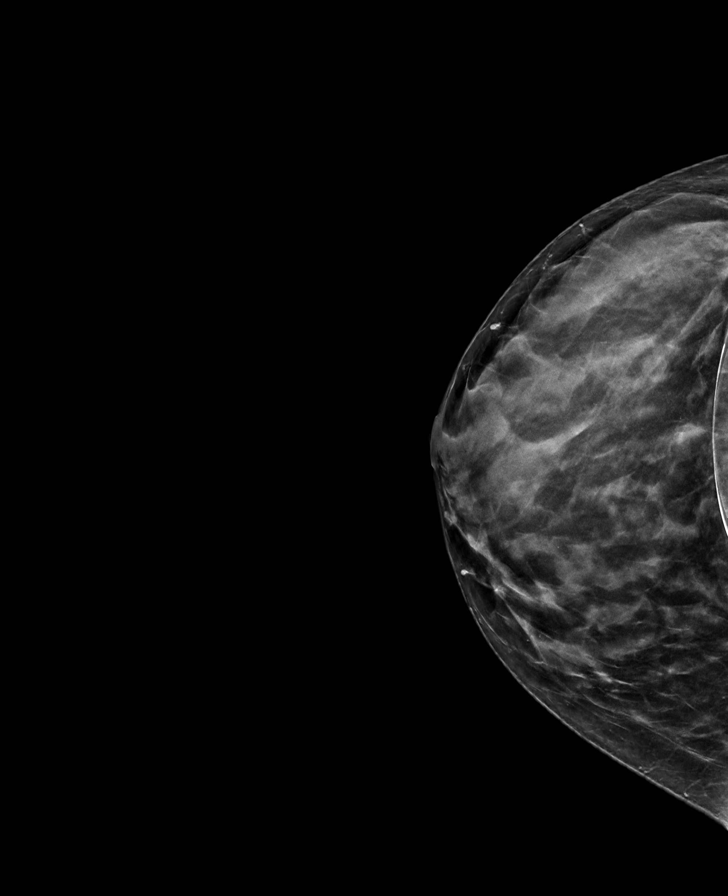

[8 of 28 positions shown; findings below may reference images not displayed]

ACR Breast Density Category d: The breast tissue is extremely dense,
which lowers the sensitivity of mammography.
FINDINGS: There are no findings suspicious for malignancy. Images were
processed with CAD.
IMPRESSION: No mammographic evidence of malignancy. A result letter of this
screening mammogram will be mailed directly to the patient.

RECOMMENDATION:
Screening mammogram in one year. (Code:JX-1-IT3)

BI-RADS CATEGORY  1:  Negative.

## 2019-12-24 MED FILL — SYNTHROID 175 MCG TABLET: 175 | 90 days supply | Qty: 90 | Fill #1

## 2020-01-27 ENCOUNTER — Telehealth: Payer: Self-pay | Admitting: Rheumatology

## 2020-01-27 ENCOUNTER — Telehealth: Payer: Self-pay | Admitting: Obstetrics and Gynecology

## 2020-01-27 NOTE — Telephone Encounter (Signed)
Patient has not been in the office since 10/25/14. Patient advised as she is no longer a patient of Dr. Corliss Skains we would not be able to give recommendations. Patient advised to contact her primary care physician.

## 2020-01-27 NOTE — Telephone Encounter (Signed)
Routing to Dr. Silva for review and recommendations

## 2020-01-27 NOTE — Telephone Encounter (Signed)
Call returned to patient, left detailed message, ok per dpr. Advised as seen below per Dr. Edward Jolly, return call to office if any additional questions.  Encounter closed.

## 2020-01-27 NOTE — Telephone Encounter (Signed)
I recommend she contact [facility] [employer] to report her symptoms.   She should also register these symptoms to the company who provided the vaccine, which I am assuming is [employer].  Between the two entities, she should be able to receive information if she is safe to proceed the the second vaccine.

## 2020-01-27 NOTE — Telephone Encounter (Signed)
Patient had her first dose of Covid-19 vaccine 01/25/20 and developed a rash with tightening in her chest. She wonders "if this a autoimmune response or a reaction to the vaccine"? She asked if Dr.Silva recommends that she have the second vaccine?

## 2020-01-27 NOTE — Telephone Encounter (Signed)
Patient calling to discuss COVID vaccine. Patient received first injection. Twelve hours later, patient developed  a rash, and tightness in chest.  Patient wants to know if she should have second injection? Please call to advise.

## 2020-02-12 ENCOUNTER — Other Ambulatory Visit: Payer: Self-pay

## 2020-02-12 ENCOUNTER — Ambulatory Visit (INDEPENDENT_AMBULATORY_CARE_PROVIDER_SITE_OTHER): Payer: 59 | Admitting: Allergy & Immunology

## 2020-02-12 ENCOUNTER — Encounter: Payer: Self-pay | Admitting: Allergy & Immunology

## 2020-02-12 DIAGNOSIS — T8069XA Other serum reaction due to other serum, initial encounter: Secondary | ICD-10-CM | POA: Diagnosis not present

## 2020-02-12 DIAGNOSIS — R0602 Shortness of breath: Secondary | ICD-10-CM

## 2020-02-12 DIAGNOSIS — L508 Other urticaria: Secondary | ICD-10-CM | POA: Diagnosis not present

## 2020-02-12 NOTE — Progress Notes (Signed)
RE: [name] MRN: [id number] DOB: July 03, 1976 Date of Telemedicine Visit: 02/12/2020  Referring provider: No ref. provider found Primary care provider: Patient, No Pcp Per  Chief Complaint: Allergic Reaction (covi-19 vaccine- delay rash hives, sob)   Telemedicine Follow Up Visit via Telephone: I connected with Sheniah Supak for a follow up on 02/12/20 by Telephone and verified that I am speaking with the correct person using two identifiers.   I discussed the limitations, risks, security and privacy concerns of performing an evaluation and management service by telemedicine and the availability of in person appointments. I also discussed with the patient that there may be a patient responsible charge related to this service. The patient expressed understanding and agreed to proceed.  Patient is at work.  Provider is at the office.  Visit start time: 11:45 AM Visit end time: 12:29 PM Insurance consent/check in by: [facility] Medical consent and medical assistant/nurse: Olivia Mackie  History of Present Illness:  Kayla Salazar is a 44 y.o. female presenting for an evaluation of an allergic reaction to the COVID19 vaccine. She is specifically wondering whether she needs testing and if it is safe for her to get the next dose.   She had her first injection at 7:50 in the morning around three weeks ago; she is scheduled to get her next injection this coming Tuesday. She received the [employer] vaccine. Then around 8pm, she started having hives on her face. She had some hives on her face and chest and broke out in a fine red rash with tightness in her chest. She called the Help Line and they recommended going to the ED or call her PCP. She called her PCP to reach out to [facility]. She took a Benadryl and the itching subsided and by the next day the rash was improving and she continued to have coughing. She called [facility] again to discuss this and she had the rash. Because of the cough, she needs to have a COVID test.  She had testing that was negative. She did have an inhaler at home and used this throughout the day to open the airway. Then the following day, she was feeling better. She took Benadryl that evening of the vaccination and through the next day. She was completely fine by the end of the day following the vaccination. t  It took 12 hours for the symptoms to show up from the time of the vaccine administration. She has never had a reaction to a vaccine like that before. She had taken Plaquenil and steroids in the past for her RA, however she has been off of all of her RA medications for five years without a problem. Around two days from the vaccine, she did get a swelling in her large joints, as well as her hands. I did clarify with her and this was very painful and did involve swelling of her joints (ie not just swelling of the subcutaneous tissue).    She has tolerated both steroid injections and Miralax in the past without any problems at all. She has never been diagnosed with any vaccine component allergies.  She has no atopic history.  She has no history of asthma.  Her daughter, whom I also see, does have a serious atopic history.  Otherwise, there have been no changes to her past medical history, surgical history, family history, or social history.  Assessment and Plan:  [name] is a 44 y.o. female with:  Acute urticaria  SOB (shortness of breath)  Allergic reaction  to vaccine   Ms. Lauture presents for a consult regarding her reaction to the COVID-19 vaccine.  The allergenic component of the Pfizer vaccine is thought to be polyethylene glycol.  It is reassuring that she has tolerated polyethylene glycol in the past and 2 different forms, including the Depo-Medrol injections and the MiraLAX.  Her history is more concerning for a serum sickness-like reaction.  I doubt an immediate hypersensitivity reaction given that the reaction took 12 hours from the injection to start showing up.  She also  developed joint pain which is definitely more consistent with a serum sickness-like reaction rather than an IgE mediated anaphylactic reaction.  The chest tightness is curious, as this is more associated with a histaminergic and IgE mediated process.  Her clinical story is certainly not entirely clear, but I cannot be certain that she will tolerate the next dose without worsening of her symptoms.  If she was just having the rash without the pulmonary and joint complaints, I would probably just recommend premedicating with an antihistamine and continuing that for a couple days after her injection.  However, I cannot be certain that if this is a serum sickness reaction, that her reaction will not get worse.  In fact, these typically do become worse on subsequent exposures.  I do not think testing to the COVID-19 vaccine components will yield any useful results since this does not seem to be an immediate hypersensitivity reaction.  Therefore, we will not pursue testing for that.  I think it would be worthwhile for her to get a vaccine that is a different mechanism, such as the [employer] vaccine. She is on board with this plan.    Diagnostics: None.  Medication List:  Current Outpatient Medications  Medication Sig Dispense Refill  . levothyroxine (SYNTHROID) 175 MCG tablet Take 1 tablet (175 mcg total) by mouth daily. 90 tablet 3  . omeprazole (PRILOSEC) 40 MG capsule Take 1 capsule by mouth daily.    . Calcium Carb-Cholecalciferol (CALCIUM 500+D3) 500-400 MG-UNIT TABS Take 1 tablet by mouth daily.     No current facility-administered medications for this visit.   Allergies: Allergies  Allergen Reactions  . Adhesive [Tape]   . Codeine     REACTION: Hives/itching/sweats  . Latex Itching   I reviewed her past medical history, social history, family history, and environmental history and no significant changes have been reported from previous visits.  Review of Systems  Constitutional:  Negative for activity change, appetite change, chills and fatigue.  HENT: Negative for congestion, postnasal drip, rhinorrhea, sinus pressure and sore throat.   Eyes: Negative for pain, discharge, redness and itching.  Respiratory: Positive for shortness of breath. Negative for wheezing and stridor.   Gastrointestinal: Negative for diarrhea, nausea and vomiting.  Endocrine: Negative for cold intolerance and heat intolerance.  Musculoskeletal: Negative for arthralgias, joint swelling and myalgias.  Skin: Positive for rash.  Allergic/Immunologic: Negative for environmental allergies and food allergies.    Objective:  Physical exam not obtained as encounter was done via telephone.   Previous notes and tests were reviewed.  I discussed the assessment and treatment plan with the patient. The patient was provided an opportunity to ask questions and all were answered. The patient agreed with the plan and demonstrated an understanding of the instructions.   The patient was advised to call back or seek an in-person evaluation if the symptoms worsen or if the condition fails to improve as anticipated.  I provided 44 minutes of  non-face-to-face time during this encounter.  It was my pleasure to participate in Taqwa Deem care today. Please feel free to contact me with any questions or concerns.   Sincerely,  Alfonse Spruce, MD

## 2020-02-22 ENCOUNTER — Other Ambulatory Visit: Payer: 59

## 2020-03-14 ENCOUNTER — Other Ambulatory Visit: Payer: Self-pay

## 2020-03-14 ENCOUNTER — Ambulatory Visit
Admission: RE | Admit: 2020-03-14 | Discharge: 2020-03-14 | Disposition: A | Discharge status: Home / Self Care | Payer: 59 | Source: Ambulatory Visit | Attending: Obstetrics and Gynecology | Admitting: Obstetrics and Gynecology

## 2020-03-14 ENCOUNTER — Other Ambulatory Visit: Payer: Self-pay | Admitting: Obstetrics and Gynecology

## 2020-03-14 DIAGNOSIS — N6320 Unspecified lump in the left breast, unspecified quadrant: Secondary | ICD-10-CM

## 2020-03-14 DIAGNOSIS — N6323 Unspecified lump in the left breast, lower outer quadrant: Secondary | ICD-10-CM | POA: Diagnosis not present

## 2020-03-16 ENCOUNTER — Other Ambulatory Visit: Payer: Self-pay

## 2020-03-16 ENCOUNTER — Ambulatory Visit
Admission: RE | Admit: 2020-03-16 | Discharge: 2020-03-16 | Disposition: A | Discharge status: Home / Self Care | Payer: 59 | Source: Ambulatory Visit | Attending: Obstetrics and Gynecology | Admitting: Obstetrics and Gynecology

## 2020-03-16 DIAGNOSIS — N6323 Unspecified lump in the left breast, lower outer quadrant: Secondary | ICD-10-CM | POA: Diagnosis not present

## 2020-03-16 DIAGNOSIS — R922 Inconclusive mammogram: Secondary | ICD-10-CM

## 2020-03-16 DIAGNOSIS — Z9882 Breast implant status: Secondary | ICD-10-CM

## 2020-03-16 DIAGNOSIS — N6320 Unspecified lump in the left breast, unspecified quadrant: Secondary | ICD-10-CM

## 2020-03-16 MED ORDER — GADOBUTROL 1 MMOL/ML IV SOLN
8.0000 mL | Freq: Once | INTRAVENOUS | Status: AC | PRN
  Administered 2020-03-16: 8 mL via INTRAVENOUS

## 2020-03-17 ENCOUNTER — Telehealth: Payer: Self-pay | Admitting: *Deleted

## 2020-03-17 NOTE — Telephone Encounter (Signed)
-----   Message from Osborne Oman sent at 03/17/2020 12:47 PM EDT ----- Regarding: Breast MRI Recommendations Hi Kayla Salazar, The above patient had a breast MRI on 03/16/20 and below are the recommendations of Dr. Britta Mccreedy.  RECOMMENDATION: Second look left breast ultrasound (and possible left breast ultrasound-guided biopsy) is recommended to evaluate a circumscribed 1.4 cm mass in the far inferior left breast as described above. The MRI appearance of this mass is suggestive of a fibroadenoma. If a probable fibroadenoma is identified at ultrasound, then standard  sonographic follow-up could be performed, at the patient's next diagnostic mammogram and left breast ultrasound in August 2021. If the mass is indeterminate at ultrasound, ultrasound-guided biopsy could be performed.   If no sonographic correlate is identified, options would include 39-month follow-up breast MRI or attempt at MRI guided biopsy (the position of the mass and proximity to the inferior aspect of the implant would make it challenging, possible prohibitive). If a probable fibroadenoma is identified sonographically, then the patient could consider bilateral breast MRI in 1 year. The patient will be due for bilateral diagnostic mammogram and left breast ultrasound in August 2021.  Please review the recommendations with the patient and enter any orders necessary.  Thank you!  Osborne Oman [facility]  [phone]

## 2020-03-17 NOTE — Telephone Encounter (Signed)
Routing message to Dr. Edward Jolly to review.

## 2020-03-18 ENCOUNTER — Other Ambulatory Visit: Payer: Self-pay | Admitting: Obstetrics and Gynecology

## 2020-03-18 DIAGNOSIS — N63 Unspecified lump in unspecified breast: Secondary | ICD-10-CM

## 2020-03-18 NOTE — Telephone Encounter (Signed)
Ok for left breast US and left breast biopsy if needed.   See result note also.

## 2020-03-18 NOTE — Telephone Encounter (Signed)
After speaking to [facility] to schedule pt.  Spoke to pt. Pt given update on results/ recommendations after breast MRI and plan of care. Pt scheduled for  left Breast US with possible bx on 03/25/2020 at 2:45 pm. Pt agreeable and verbalized understanding.   Routing to Dr Edward Jolly for review and will close encounter. Orders placed by Lanora Manis at [facility].  Cc: Chapman Fitch, RN to place in [facility] hold for August 2021 for bil. Dx MMG and left breast US.

## 2020-03-18 NOTE — Telephone Encounter (Signed)
Patient is calling regarding results seen in MyChart. Patient is requesting a return call to go over next steps.

## 2020-03-21 NOTE — Telephone Encounter (Signed)
Patient placed in MMG hold.   04 recall placed.

## 2020-03-25 ENCOUNTER — Ambulatory Visit
Admission: RE | Admit: 2020-03-25 | Discharge: 2020-03-25 | Disposition: A | Discharge status: Home / Self Care | Payer: 59 | Source: Ambulatory Visit | Attending: Obstetrics and Gynecology | Admitting: Obstetrics and Gynecology

## 2020-03-25 ENCOUNTER — Other Ambulatory Visit: Payer: Self-pay | Admitting: Obstetrics and Gynecology

## 2020-03-25 ENCOUNTER — Other Ambulatory Visit: Payer: Self-pay

## 2020-03-25 DIAGNOSIS — N63 Unspecified lump in unspecified breast: Secondary | ICD-10-CM

## 2020-03-25 DIAGNOSIS — N6320 Unspecified lump in the left breast, unspecified quadrant: Secondary | ICD-10-CM | POA: Diagnosis not present

## 2020-03-28 ENCOUNTER — Telehealth: Payer: Self-pay

## 2020-03-28 ENCOUNTER — Telehealth: Payer: 59 | Admitting: Emergency Medicine

## 2020-03-28 DIAGNOSIS — R922 Inconclusive mammogram: Secondary | ICD-10-CM

## 2020-03-28 DIAGNOSIS — Z9882 Breast implant status: Secondary | ICD-10-CM

## 2020-03-28 DIAGNOSIS — N6320 Unspecified lump in the left breast, unspecified quadrant: Secondary | ICD-10-CM

## 2020-03-28 DIAGNOSIS — L2489 Irritant contact dermatitis due to other agents: Secondary | ICD-10-CM

## 2020-03-28 MED ORDER — PREDNISONE 20 MG PO TABS: ORAL_TABLET | ORAL | 0 refills | Status: DC

## 2020-03-28 MED FILL — predniSONE 20 MG TABS: 20 | 15 days supply | Qty: 18 | Fill #0

## 2020-03-28 NOTE — Telephone Encounter (Signed)
Patient is calling to follow up on call regarding breast MRI.

## 2020-03-28 NOTE — Progress Notes (Signed)

## 2020-03-28 NOTE — Telephone Encounter (Signed)
Reviewed 3/26/21Breast [place] results and recommendations.   RECOMMENDATION: Follow-up bilateral breast MRI with and without contrast is recommended in 3 to 6 months to evaluate the inferior left breast mass.  Additionally, the patient is due for bilateral diagnostic mammogram and left breast ultrasound in August 2021.    Order pended for Bilateral Breast MRI w/wo contrast.  Dx: Left breast mass, hx of bilateral breast implants  Dr. Edward Jolly -please advise on breast MRI.

## 2020-03-28 NOTE — Telephone Encounter (Signed)
Patient called to schedule a breast MRI. Patient stated she tried to schedule with [facility] but there was not an order.

## 2020-03-29 ENCOUNTER — Other Ambulatory Visit (HOSPITAL_COMMUNITY): Payer: Self-pay | Admitting: Gastroenterology

## 2020-03-29 MED FILL — OMEPRAZOLE 40 MG CPDR: 40 | 90 days supply | Qty: 90 | Fill #0

## 2020-03-29 MED FILL — SYNTHROID 175 MCG TABLET: 175 | 90 days supply | Qty: 90 | Fill #2

## 2020-03-29 NOTE — Telephone Encounter (Signed)
Please place order for breast MRI for 3 - 6 months.   Please place in recall for 3 months for breast MRI.   Please also place in recall for dx mammogram and left breast US for August, 2021.

## 2020-03-29 NOTE — Telephone Encounter (Signed)
Call to patient, left detailed message, ok per dpr. Advised order has been placed for bilateral breast MRI at [facility], due 3-6 months. Advised [facility] should contact you directly to schedule, you may also contact them at [phone] to schedule. Our office will precert closer to date of service. Return call to office if any additional questions.   Patient is in 04 recall for Dx MMG 07/2020.   04 Recall placed for MRI.   Removed from MMG hold. Placed in IMG hold.   Routing to provider for final review. Patient is agreeable to disposition. Will close encounter.  Cc: Webb Silversmith, Sound[facility] Pilon

## 2020-05-25 ENCOUNTER — Ambulatory Visit: Payer: 59

## 2020-05-25 ENCOUNTER — Telehealth: Payer: Self-pay | Admitting: Allergy & Immunology

## 2020-05-25 NOTE — Telephone Encounter (Signed)
Dr. Dellis Anes please advise that hopefully writing a letter should be sufficient for helping the patient receive her J+J vaccine.

## 2020-05-25 NOTE — Telephone Encounter (Signed)
Letter typed up and printed in the nurses station in [place].  Can someone call patient to let her know that the letter is ready?  Malachi Bonds, MD [facility]

## 2020-05-25 NOTE — Telephone Encounter (Signed)
Patient had a reaction to her first dose of the Pfizer vaccine. Dr. Dellis Anes advised her against getting her second dose, but told her when the [facility] [place] vaccine came out, that she should get that one. She went to the [facility] today to get the J&J, and they told her since she had one dose of the Pfizer, that she could not mix the two vaccines.

## 2020-05-26 NOTE — Telephone Encounter (Signed)
Called and spoke with patient and advised of letter. Patient verbalized understanding and would like letter mailed to her home. Letter has been placed in the mail.

## 2020-07-11 MED FILL — SYNTHROID 175 MCG TABLET: 175 | 90 days supply | Qty: 90 | Fill #3

## 2020-07-26 ENCOUNTER — Ambulatory Visit: Payer: 59 | Attending: Critical Care Medicine

## 2020-07-26 DIAGNOSIS — Z23 Encounter for immunization: Secondary | ICD-10-CM

## 2020-07-26 NOTE — Progress Notes (Signed)
   Covid-19 Vaccination Clinic  Name:  Kayla Salazar    MRN: [id number] DOB: 10-Jun-1976  07/26/2020  Kayla Salazar was observed post Covid-19 immunization for 30 minutes based on pre-vaccination screening without incident. She was provided with Vaccine Information Sheet and instruction to access the V-Safe system.   Kayla Salazar was instructed to call 911 with any severe reactions post vaccine: Marland Kitchen Difficulty breathing  . Swelling of face and throat  . A fast heartbeat  . A bad rash all over body  . Dizziness and weakness   Immunizations Administered    Name Date Dose VIS Date Route   JANSSEN COVID-19 VACCINE 07/26/2020  2:12 PM 0.5 mL 02/27/2020 Intramuscular   Manufacturer: [employer]   Lot: [id number]   NDC: 84536-468-03

## 2020-08-08 ENCOUNTER — Other Ambulatory Visit: Payer: 59

## 2020-08-08 DIAGNOSIS — E663 Overweight: Secondary | ICD-10-CM | POA: Diagnosis not present

## 2020-08-08 DIAGNOSIS — M359 Systemic involvement of connective tissue, unspecified: Secondary | ICD-10-CM | POA: Diagnosis not present

## 2020-08-08 DIAGNOSIS — M2550 Pain in unspecified joint: Secondary | ICD-10-CM | POA: Diagnosis not present

## 2020-08-08 DIAGNOSIS — Z6827 Body mass index (BMI) 27.0-27.9, adult: Secondary | ICD-10-CM | POA: Diagnosis not present

## 2020-08-16 MED FILL — OMEPRAZOLE 40 MG CPDR: 40 | 90 days supply | Qty: 90 | Fill #1

## 2020-08-19 ENCOUNTER — Other Ambulatory Visit: Payer: 59

## 2020-08-22 ENCOUNTER — Ambulatory Visit: Payer: 59 | Admitting: Obstetrics and Gynecology

## 2020-08-29 ENCOUNTER — Other Ambulatory Visit: Payer: 59

## 2020-08-31 DIAGNOSIS — H524 Presbyopia: Secondary | ICD-10-CM | POA: Diagnosis not present

## 2020-09-08 ENCOUNTER — Ambulatory Visit
Admission: RE | Admit: 2020-09-08 | Discharge: 2020-09-08 | Disposition: A | Discharge status: Home / Self Care | Payer: 59 | Source: Ambulatory Visit | Attending: Obstetrics and Gynecology | Admitting: Obstetrics and Gynecology

## 2020-09-08 ENCOUNTER — Other Ambulatory Visit: Payer: 59

## 2020-09-08 DIAGNOSIS — R922 Inconclusive mammogram: Secondary | ICD-10-CM

## 2020-09-08 DIAGNOSIS — N6022 Fibroadenosis of left breast: Secondary | ICD-10-CM | POA: Diagnosis not present

## 2020-09-08 DIAGNOSIS — Z9882 Breast implant status: Secondary | ICD-10-CM

## 2020-09-08 DIAGNOSIS — N6320 Unspecified lump in the left breast, unspecified quadrant: Secondary | ICD-10-CM

## 2020-09-08 MED ORDER — GADOBUTROL 1 MMOL/ML IV SOLN
8.0000 mL | Freq: Once | INTRAVENOUS | Status: AC | PRN
  Administered 2020-09-08: 8 mL via INTRAVENOUS

## 2020-09-09 ENCOUNTER — Ambulatory Visit
Admission: RE | Admit: 2020-09-09 | Discharge: 2020-09-09 | Disposition: A | Discharge status: Home / Self Care | Payer: 59 | Source: Ambulatory Visit | Attending: Obstetrics and Gynecology | Admitting: Obstetrics and Gynecology

## 2020-09-09 ENCOUNTER — Other Ambulatory Visit: Payer: Self-pay | Admitting: Obstetrics and Gynecology

## 2020-09-09 ENCOUNTER — Other Ambulatory Visit: Payer: Self-pay

## 2020-09-09 DIAGNOSIS — N6323 Unspecified lump in the left breast, lower outer quadrant: Secondary | ICD-10-CM | POA: Diagnosis not present

## 2020-09-09 DIAGNOSIS — N6320 Unspecified lump in the left breast, unspecified quadrant: Secondary | ICD-10-CM

## 2020-09-09 DIAGNOSIS — R922 Inconclusive mammogram: Secondary | ICD-10-CM | POA: Diagnosis not present

## 2020-09-12 ENCOUNTER — Telehealth: Payer: Self-pay | Admitting: *Deleted

## 2020-09-12 DIAGNOSIS — R922 Inconclusive mammogram: Secondary | ICD-10-CM

## 2020-09-12 DIAGNOSIS — Z9882 Breast implant status: Secondary | ICD-10-CM

## 2020-09-12 DIAGNOSIS — N6320 Unspecified lump in the left breast, unspecified quadrant: Secondary | ICD-10-CM

## 2020-09-12 NOTE — Telephone Encounter (Signed)
Leda Min, RN  09/12/2020 1:42 PM EDT Back to Top    Left message to call Noreene Larsson, RN at [facility] [phone].   Removed from current MMG recall.  New MMG recall placed for 6 mo and 12 mo. Order placed for breast MRI to be scheduled 02/2021.   See 09/12/20 telephone encounter.

## 2020-09-12 NOTE — Telephone Encounter (Signed)
-----   Message from Patton Salles, MD sent at 09/12/2020 12:39 PM EDT ----- Please remove from mammogram hold and place in recall for 6 and 12 months from now.

## 2020-09-12 NOTE — Telephone Encounter (Signed)
Patient is returning call.  °

## 2020-09-12 NOTE — Telephone Encounter (Signed)
Call returned to patient, left detailed message, ok per dpr. Advised order has been placed for 6 mo f/u breast MRI at [facility] to be scheduled 02/2021. [facility] should contact you directly to schedule. Return call to office if any additional questions.    Routing to [employer].   Encounter closed.

## 2020-10-24 ENCOUNTER — Other Ambulatory Visit: Payer: Self-pay | Admitting: Obstetrics and Gynecology

## 2020-10-24 DIAGNOSIS — E039 Hypothyroidism, unspecified: Secondary | ICD-10-CM

## 2020-10-24 MED FILL — SYNTHROID 175 MCG TABLET: 175 | 30 days supply | Qty: 30 | Fill #0

## 2020-10-24 NOTE — Telephone Encounter (Signed)
Medication refill request: Synthroid  Last AEX:  08/18/19 Next AEX: 03/23/21 Last MMG (if hormonal medication request): 09/09/20  Benign mass Refill authorized: 90/1

## 2020-10-24 NOTE — Telephone Encounter (Signed)
Spoke with patient, advised per Dr. Edward Jolly. Patient is agreeable to schedule lab appt.  Lab appt scheduled for 10/26 at 1:15pm.  Orders placed by Dr. Edward Jolly.   Encounter closed.

## 2020-10-24 NOTE — Telephone Encounter (Signed)
Please have patient schedule a lab visit for thyroid function testing.  I will refill her Synthroid for one month to give her time to come in for a lab visit so we can be sure she is on the correct dosage.

## 2020-10-25 ENCOUNTER — Other Ambulatory Visit: Payer: Self-pay

## 2020-10-25 ENCOUNTER — Other Ambulatory Visit (INDEPENDENT_AMBULATORY_CARE_PROVIDER_SITE_OTHER): Payer: 59

## 2020-10-25 DIAGNOSIS — E039 Hypothyroidism, unspecified: Secondary | ICD-10-CM | POA: Diagnosis not present

## 2020-10-26 LAB — T4, FREE: Free T4: 0.93 ng/dL (ref 0.82–1.77)

## 2020-10-26 LAB — TSH: TSH: 5.75 u[IU]/mL — ABNORMAL HIGH (ref 0.450–4.500)

## 2020-11-01 ENCOUNTER — Other Ambulatory Visit: Payer: Self-pay | Admitting: *Deleted

## 2020-11-01 DIAGNOSIS — E039 Hypothyroidism, unspecified: Secondary | ICD-10-CM

## 2020-11-01 MED ORDER — LEVOTHYROXINE SODIUM 200 MCG PO TABS: 200.0000 ug | ORAL_TABLET | Freq: Every day | ORAL | 1 refills | Status: DC

## 2020-11-01 MED FILL — SYNTHROID 200 MCG TABLET: 200 | 90 days supply | Qty: 90 | Fill #0

## 2020-12-31 DIAGNOSIS — Z8616 Personal history of COVID-19: Secondary | ICD-10-CM

## 2020-12-31 HISTORY — DX: Personal history of COVID-19: Z86.16

## 2021-01-02 ENCOUNTER — Other Ambulatory Visit: Payer: Self-pay

## 2021-01-06 MED FILL — OMEPRAZOLE 40 MG CPDR: 40 | 90 days supply | Qty: 90 | Fill #2

## 2021-01-09 ENCOUNTER — Telehealth: Payer: Self-pay | Admitting: *Deleted

## 2021-01-09 NOTE — Telephone Encounter (Signed)
-----   Message from Neta Ehlers sent at 01/09/2021  2:46 PM EST ----- Regarding: infection? Patient called with concerns of a yeast infection and asking if a prescription could be called to her pharmacy? I informed she may need an appointment to evaluate. She is not able to come for an appointment today or tomorrow due to her work schedule.

## 2021-01-09 NOTE — Telephone Encounter (Signed)
Spoke with patient and advised she will need to schedule office visit for treatment. Patient scheduled tomorrow with Dr.Silva.

## 2021-01-10 ENCOUNTER — Other Ambulatory Visit: Payer: Self-pay

## 2021-01-10 ENCOUNTER — Other Ambulatory Visit (INDEPENDENT_AMBULATORY_CARE_PROVIDER_SITE_OTHER): Payer: 59

## 2021-01-10 ENCOUNTER — Encounter: Payer: Self-pay | Admitting: Obstetrics and Gynecology

## 2021-01-10 ENCOUNTER — Ambulatory Visit (INDEPENDENT_AMBULATORY_CARE_PROVIDER_SITE_OTHER): Payer: 59 | Admitting: Obstetrics and Gynecology

## 2021-01-10 ENCOUNTER — Other Ambulatory Visit: Payer: Self-pay | Admitting: Obstetrics and Gynecology

## 2021-01-10 VITALS — BP 136/88 | HR 78 | Resp 14 | Ht 68.0 in | Wt 194.0 lb

## 2021-01-10 DIAGNOSIS — N760 Acute vaginitis: Secondary | ICD-10-CM

## 2021-01-10 DIAGNOSIS — E039 Hypothyroidism, unspecified: Secondary | ICD-10-CM | POA: Diagnosis not present

## 2021-01-10 LAB — WET PREP FOR TRICH, YEAST, CLUE

## 2021-01-10 LAB — TSH: TSH: 0.52 mIU/L

## 2021-01-10 LAB — T4, FREE: Free T4: 1.6 ng/dL (ref 0.8–1.8)

## 2021-01-10 LAB — T3, FREE: T3, Free: 3.4 pg/mL (ref 2.3–4.2)

## 2021-01-10 MED ORDER — FLUCONAZOLE 150 MG PO TABS
150.0000 mg | ORAL_TABLET | Freq: Once | ORAL | 0 refills | Status: DC
Start: 2021-01-10 — End: 2021-01-10

## 2021-01-10 MED FILL — FLUCONAZOLE 150 MG TABS: 150 | 3 days supply | Qty: 2 | Fill #0

## 2021-01-10 NOTE — Progress Notes (Signed)
GYNECOLOGY  VISIT   HPI: 45 y.o.   Married  Caucasian  female   [phone] with Patient's last menstrual period was 06/22/2011.   here for   Vaginal itching and burning that started 3 days ago. Internal itching.  No significant discharge.  No odor.  Burning with the skin externally.   No recent abx or steroid use.   OTC meds burn.   Having her thyroid function rechecked today.  Has hypothyroidism.  Recent increase in her Synthroid to 200 mcg daily.  Asking about her referral to endocrinology, Dr. Sharl Ma.  She called their office and did not hear back from them.  Due for her breast MRI in March to evaluate area of the left breast.  MRI report reviewed with patient today.  Mammogram/US due in Sept, 2022.   GYNECOLOGIC HISTORY: Patient's last menstrual period was 06/22/2011. Contraception:  Hyst Menopausal hormone therapy:  no Last mammogram: 09-08-20 Bil.Br.MRI/Bil.implants--See Epic Last pap smear:  06-24-18 Neg:Neg HR HPV, 10-15-16 Neg:Neg HR HPV        OB History    Gravida  3   Para  2   Term  2   Preterm      AB  1   Living  2     SAB  1   IAB      Ectopic      Multiple      Live Births                 Patient Active Problem List   Diagnosis Date Noted  . OVERWEIGHT 07/07/2008  . ANEMIA-IRON DEFICIENCY 07/07/2008  . ANEMIA, SEVERE 07/07/2008  . GERD 07/07/2008  . PEPTIC ULCER DISEASE 07/07/2008  . IBS 07/07/2008  . BACK PAIN 07/07/2008  . MYALGIA 07/07/2008  . SLEEPINESS 07/07/2008  . PUD, HX OF 07/07/2008    Past Medical History:  Diagnosis Date  . Anemia   . Autoimmune disorder (HCC)   . 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  IVP done in 2012.  . Gastric ulcer   . Iron deficiency anemia    hx iron infusions with Dr. Myna Hidalgo  . Thyroid disease    hypothyroid    Past Surgical History:  Procedure Laterality Date  . AUGMENTATION MAMMAPLASTY  2004  . BIOPSY  04/21/2019   Procedure: BIOPSY;  Surgeon: Willis Modena, MD;  Location: [facility] ENDOSCOPY;  Service: Endoscopy;;  . BREAST SURGERY     augmentation  . ESOPHAGEAL DILATION     Dr. Ewing Schlein.  Procedure done twice in past.   . ESOPHAGOGASTRODUODENOSCOPY N/A 04/21/2019   Procedure: ESOPHAGOGASTRODUODENOSCOPY (EGD);  Surgeon: Willis Modena, MD;  Location: [facility] ENDOSCOPY;  Service: Endoscopy;  Laterality: N/A;  . EYE SURGERY     lasik  . FOOT FRACTURE SURGERY Left    2020  . LAPAROSCOPIC SUPRACERVICAL HYSTERECTOMY  07/10/2011   Procedure: LAPAROSCOPIC SUPRACERVICAL HYSTERECTOMY;  Surgeon: Melony Overly;  Location: [facility];  Service: Gynecology;  Laterality: N/A;  Laparoscopic Supracervical Hysterectomy & Cystoscopy  . PROXIMAL INTERPHALANGEAL FUSION (PIP) Left 01/05/2019   Procedure: PROXIMAL INTERPHALANGEAL FUSION (PIP);  Surgeon: Terance Hart, MD;  Location: [facility];  Service: Orthopedics;  Laterality: Left;  . right knee    . vestibuectomy  2003   Dr Edward Jolly    Current Outpatient Medications  Medication Sig Dispense Refill  . Calcium Carb-Cholecalciferol (CALCIUM 500+D3) 500-400 MG-UNIT TABS Take 1 tablet by mouth daily.    Marland Kitchen levothyroxine (SYNTHROID) 200  MCG tablet Take 1 tablet (200 mcg total) by mouth daily before breakfast. 90 tablet 1  . omeprazole (PRILOSEC) 40 MG capsule Take 1 capsule by mouth daily.     No current facility-administered medications for this visit.     ALLERGIES: Adhesive [tape], Codeine, and Latex  Family History  Problem Relation Age of Onset  . Rheum arthritis Mother   . Hypertension Mother   . Migraines Mother   . Hyperlipidemia Mother   . Thyroid disease Mother   . Thyroid disease Father   . Seizures Sister   . Hypertension Maternal Grandmother   . Hyperlipidemia Maternal Grandmother   . Stroke Maternal Grandmother   . Hypertension Paternal Grandmother   . Stroke Paternal Grandmother     Social History   Socioeconomic History  . Marital status: Married    Spouse name: Not on  file  . Number of children: Not on file  . Years of education: Not on file  . Highest education level: Not on file  Occupational History  . Not on file  Tobacco Use  . Smoking status: Never Smoker  . Smokeless tobacco: Never Used  Vaping Use  . Vaping Use: Never used  Substance and Sexual Activity  . Alcohol use: No    Alcohol/week: 0.0 standard drinks  . Drug use: No  . Sexual activity: Yes    Partners: Male    Birth control/protection: Surgical    Comment: supracervical hyst 2012  Other Topics Concern  . Not on file  Social History Narrative   Lives with husband and two daughters   Social Determinants of Health   Financial Resource Strain: Not on file  Food Insecurity: Not on file  Transportation Needs: Not on file  Physical Activity: Not on file  Stress: Not on file  Social Connections: Not on file  Intimate Partner Violence: Not on file    Review of Systems  Genitourinary:       Vaginal itching and burning  All other systems reviewed and are negative.   PHYSICAL EXAMINATION:    BP 136/88 (BP Location: Right Arm, Patient Position: Sitting, Cuff Size: Normal)   Pulse 78   Resp 14   Ht 5\' 8"  (1.727 m)   Wt 194 lb (88 kg)   LMP 06/22/2011   BMI 29.50 kg/m     General appearance: alert, cooperative and appears stated age  Pelvic: External genitalia:  no lesions              Urethra:  normal appearing urethra with no masses, tenderness or lesions              Bartholins and Skenes: normal                 Vagina: normal appearing vagina with normal color and discharge, no lesions              Cervix: no lesions                Bimanual Exam:  Uterus:  Absent.              Adnexa: no mass, fullness, tenderness             Chaperone was present for exam.  ASSESSMENT  Yeast vaginitis.  Hypothyroidism.   Left breast change on MRI.   PLAN  Wet prep:  Positive yeast, negative trichomonas, negative clue cells.  Diflucan 150 mg po x 1.  May repeat in 72  hours prn.  Will check TSH, free T4, free T3. Referral was placed to Dr. Sharl Ma.  She will contact their office.  Breast MRI in March.  Fu for annual exam in March.  21 min  total time was spent for this patient encounter, including preparation, face-to-face counseling with the patient, coordination of care, and documentation of the encounter.

## 2021-01-10 NOTE — Patient Instructions (Signed)
Vaginal Yeast Infection, Adult  Vaginal yeast infection is a condition that causes vaginal discharge as well as soreness, swelling, and redness (inflammation) of the vagina. This is a common condition. Some women get this infection frequently. What are the causes? This condition is caused by a change in the normal balance of the yeast (candida) and bacteria that live in the vagina. This change causes an overgrowth of yeast, which causes the inflammation. What increases the risk? The condition is more likely to develop in women who:  Take antibiotic medicines.  Have diabetes.  Take birth control pills.  Are pregnant.  Douche often.  Have a weak body defense system (immune system).  Have been taking steroid medicines for a long time.  Frequently wear tight clothing. What are the signs or symptoms? Symptoms of this condition include:  White, thick, creamy vaginal discharge.  Swelling, itching, redness, and irritation of the vagina. The lips of the vagina (vulva) may be affected as well.  Pain or a burning feeling while urinating.  Pain during sex. How is this diagnosed? This condition is diagnosed based on:  Your medical history.  A physical exam.  A pelvic exam. Your health care provider will examine a sample of your vaginal discharge under a microscope. Your health care provider may send this sample for testing to confirm the diagnosis. How is this treated? This condition is treated with medicine. Medicines may be over-the-counter or prescription. You may be told to use one or more of the following:  Medicine that is taken by mouth (orally).  Medicine that is applied as a cream (topically).  Medicine that is inserted directly into the vagina (suppository). Follow these instructions at home: Lifestyle  Do not have sex until your health care provider approves. Tell your sex partner that you have a yeast infection. That person should go to his or her health care  provider and ask if they should also be treated.  Do not wear tight clothes, such as pantyhose or tight pants.  Wear breathable cotton underwear. General instructions  Take or apply over-the-counter and prescription medicines only as told by your health care provider.  Eat more yogurt. This may help to keep your yeast infection from returning.  Do not use tampons until your health care provider approves.  Try taking a sitz bath to help with discomfort. This is a warm water bath that is taken while you are sitting down. The water should only come up to your hips and should cover your buttocks. Do this 3-4 times per day or as told by your health care provider.  Do not douche.  If you have diabetes, keep your blood sugar levels under control.  Keep all follow-up visits as told by your health care provider. This is important.   Contact a health care provider if:  You have a fever.  Your symptoms go away and then return.  Your symptoms do not get better with treatment.  Your symptoms get worse.  You have new symptoms.  You develop blisters in or around your vagina.  You have blood coming from your vagina and it is not your menstrual period.  You develop pain in your abdomen. Summary  Vaginal yeast infection is a condition that causes discharge as well as soreness, swelling, and redness (inflammation) of the vagina.  This condition is treated with medicine. Medicines may be over-the-counter or prescription.  Take or apply over-the-counter and prescription medicines only as told by your health care provider.  Do not   douche. Do not have sex or use tampons until your health care provider approves.  Contact a health care provider if your symptoms do not get better with treatment or your symptoms go away and then return. This information is not intended to replace advice given to you by your health care provider. Make sure you discuss any questions you have with your health care  provider. Document Revised: 07/17/2019 Document Reviewed: 05/05/2018 Elsevier Patient Education  2021 Elsevier Inc.  

## 2021-01-17 ENCOUNTER — Other Ambulatory Visit: Payer: Self-pay | Admitting: Radiation Oncology

## 2021-01-17 DIAGNOSIS — U071 COVID-19: Secondary | ICD-10-CM

## 2021-01-17 NOTE — Progress Notes (Signed)
  Radiation Oncology         [phone] ________________________________  Name: Kayla Salazar MRN: [id number]  Date: 01/17/2021  DOB: July 08, 1976  Courtesy Consult Note:  I was consulted by this patient regarding recent COVID-19 positive test.  She has had URI symptoms since Friday and tested yesterday.  She learned about a positive test this morning.  Given her chronic condition of obesity, I consulted with our COVID infusion clinic team and was advised to enter a referral for COVID treatment.  I entered the referral on her behalf to help expedite care.  ________________________________  Artist Pais. Kathrynn Running, M.D.

## 2021-01-17 NOTE — Progress Notes (Signed)
Below is a screenshot of the text message where Coralie consented for me to participate in her care.

## 2021-01-18 ENCOUNTER — Other Ambulatory Visit: Payer: Self-pay | Admitting: Adult Health

## 2021-01-18 ENCOUNTER — Telehealth: Payer: Self-pay

## 2021-01-18 ENCOUNTER — Ambulatory Visit (HOSPITAL_COMMUNITY): Payer: 59

## 2021-01-18 NOTE — Progress Notes (Signed)
I connected by phone with Kayla Salazar on 01/18/2021 at 2:49 PM to discuss the potential use of a new treatment for mild to moderate COVID-19 viral infection in non-hospitalized patients.  This patient is a 45 y.o. female that meets the FDA criteria for Emergency Use Authorization of COVID monoclonal antibody sotrovimab.  Has a (+) direct SARS-CoV-2 viral test result  Has mild or moderate COVID-19   Is NOT hospitalized due to COVID-19  Is within 10 days of symptom onset  Has at least one of the high risk factor(s) for progression to severe COVID-19 and/or hospitalization as defined in EUA.  Specific high risk criteria : Immunosuppressive Disease or Treatment   I have spoken and communicated the following to the patient or parent/caregiver regarding COVID monoclonal antibody treatment:  1. FDA has authorized the emergency use for the treatment of mild to moderate COVID-19 in adults and pediatric patients with positive results of direct SARS-CoV-2 viral testing who are 66 years of age and older weighing at least 40 kg, and who are at high risk for progressing to severe COVID-19 and/or hospitalization.  2. The significant known and potential risks and benefits of COVID monoclonal antibody, and the extent to which such potential risks and benefits are unknown.  3. Information on available alternative treatments and the risks and benefits of those alternatives, including clinical trials.  4. Patients treated with COVID monoclonal antibody should continue to self-isolate and use infection control measures (e.g., wear mask, isolate, social distance, avoid sharing personal items, clean and disinfect "high touch" surfaces, and frequent handwashing) according to CDC guidelines.   5. The patient or parent/caregiver has the option to accept or refuse COVID monoclonal antibody treatment.  After reviewing this information with the patient, the patient has agreed to receive one of the available covid  19 monoclonal antibodies and will be provided an appropriate fact sheet prior to infusion.  Sx onset 1/14. Test in epic, RA/BMI RF .  Set up for MAB 1/20 at 1230   [facility], NP 01/18/2021 2:49 PM

## 2021-01-18 NOTE — Telephone Encounter (Signed)
Called to discuss with patient about COVID-19 symptoms and the use of one of the available treatments for those with mild to moderate Covid symptoms and at a high risk of hospitalization.  Pt appears to qualify for outpatient treatment due to co-morbid conditions and/or a member of an at-risk group in accordance with the FDA Emergency Use Authorization.    Symptom onset: 01/13/21 Vaccinated: Yes Booster? nO Immunocompromised? Rheumatoid Arthritis Qualifiers: Obesity  Unable to reach pt - Reached. Pt.  Kayla Salazar  Pt. Reports [facility] gave her infusion clinic number. Also states has a referral from Dr. Margaretmary Dys. Daughter has also been referred.

## 2021-01-19 ENCOUNTER — Ambulatory Visit (HOSPITAL_COMMUNITY)
Admission: RE | Admit: 2021-01-19 | Discharge: 2021-01-19 | Disposition: A | Discharge status: Home / Self Care | Payer: 59 | Source: Ambulatory Visit | Attending: Pulmonary Disease | Admitting: Pulmonary Disease

## 2021-01-19 DIAGNOSIS — U071 COVID-19: Secondary | ICD-10-CM | POA: Insufficient documentation

## 2021-01-19 MED ORDER — SODIUM CHLORIDE 0.9 % IV SOLN: INTRAVENOUS | Status: DC | PRN

## 2021-01-19 MED ORDER — FAMOTIDINE IN NACL 20-0.9 MG/50ML-% IV SOLN: 20.0000 mg | Freq: Once | INTRAVENOUS | Status: DC | PRN

## 2021-01-19 MED ORDER — SOTROVIMAB 500 MG/8ML IV SOLN
500.0000 mg | Freq: Once | INTRAVENOUS | Status: AC
  Administered 2021-01-19: 500 mg via INTRAVENOUS

## 2021-01-19 MED ORDER — ALBUTEROL SULFATE HFA 108 (90 BASE) MCG/ACT IN AERS: 2.0000 | INHALATION_SPRAY | Freq: Once | RESPIRATORY_TRACT | Status: DC | PRN

## 2021-01-19 MED ORDER — METHYLPREDNISOLONE SODIUM SUCC 125 MG IJ SOLR: 125.0000 mg | Freq: Once | INTRAMUSCULAR | Status: DC | PRN

## 2021-01-19 MED ORDER — DIPHENHYDRAMINE HCL 50 MG/ML IJ SOLN: 50.0000 mg | Freq: Once | INTRAMUSCULAR | Status: DC | PRN

## 2021-01-19 MED ORDER — EPINEPHRINE 0.3 MG/0.3ML IJ SOAJ: 0.3000 mg | Freq: Once | INTRAMUSCULAR | Status: DC | PRN

## 2021-01-19 NOTE — Progress Notes (Signed)
Diagnosis: COVID-19  Physician: Dr. Patrick Wright  Procedure: Covid Infusion Clinic Med: Sotrovimab infusion - Provided patient with sotrovimab fact sheet for patients, parents, and caregivers prior to infusion.   Complications: No immediate complications noted  Discharge: Discharged home    

## 2021-01-19 NOTE — Progress Notes (Signed)
Patient reviewed Fact Sheet for Patients, Parents, and Caregivers for Emergency Use Authorization (EUA) of sotrovimab for the Treatment of Coronavirus. Patient also reviewed and is agreeable to the estimated cost of treatment. Patient is agreeable to proceed.   

## 2021-01-19 NOTE — Discharge Instructions (Signed)

## 2021-02-06 ENCOUNTER — Other Ambulatory Visit: Payer: Self-pay

## 2021-02-06 ENCOUNTER — Ambulatory Visit
Admission: RE | Admit: 2021-02-06 | Discharge: 2021-02-06 | Disposition: A | Discharge status: Home / Self Care | Payer: 59 | Source: Ambulatory Visit | Attending: Obstetrics and Gynecology | Admitting: Obstetrics and Gynecology

## 2021-02-06 DIAGNOSIS — Z9882 Breast implant status: Secondary | ICD-10-CM

## 2021-02-06 DIAGNOSIS — R922 Inconclusive mammogram: Secondary | ICD-10-CM

## 2021-02-06 DIAGNOSIS — N6320 Unspecified lump in the left breast, unspecified quadrant: Secondary | ICD-10-CM

## 2021-02-06 DIAGNOSIS — N6323 Unspecified lump in the left breast, lower outer quadrant: Secondary | ICD-10-CM | POA: Diagnosis not present

## 2021-02-06 MED ORDER — GADOBUTROL 1 MMOL/ML IV SOLN
9.0000 mL | Freq: Once | INTRAVENOUS | Status: AC | PRN
  Administered 2021-02-06: 9 mL via INTRAVENOUS

## 2021-02-08 DIAGNOSIS — E663 Overweight: Secondary | ICD-10-CM | POA: Diagnosis not present

## 2021-02-08 DIAGNOSIS — M2550 Pain in unspecified joint: Secondary | ICD-10-CM | POA: Diagnosis not present

## 2021-02-08 DIAGNOSIS — M359 Systemic involvement of connective tissue, unspecified: Secondary | ICD-10-CM | POA: Diagnosis not present

## 2021-02-08 DIAGNOSIS — Z6829 Body mass index (BMI) 29.0-29.9, adult: Secondary | ICD-10-CM | POA: Diagnosis not present

## 2021-02-16 MED FILL — SYNTHROID 200 MCG TABLET: 200 | 90 days supply | Qty: 90 | Fill #1

## 2021-03-22 NOTE — Progress Notes (Signed)
45 y.o. G73P2012 Married Caucasian female here for annual exam.    Had bleeding with intercourse one month ago and hasn't seen any since.  Thyroid level is stable now.  Would like a referral to Dr. Cathrine Muster Covid in January.  Received antibody infusion.  Some fatigue.  No respiratory problems.   PCP: None  Patient's last menstrual period was 06/22/2011.           Sexually active: Yes.    The current method of family planning is status post hysterectomy--SUPRACERVICAL.    Exercising: No.  The patient does not participate in regular exercise at present. Smoker:  no  Health Maintenance: Pap:  06-24-18 Neg:Neg HR HPV, 10-15-16 Neg:Neg HR HPV, 04-29-13 Neg:Neg HR HPV History of abnormal Pap:  no MMG: 02-06-21 Br.MRI/Neg/SEE Epic/Diag.MMG & Lt.Br.US 08/2021 to document 2 yr.stability of probably benign mass in Lt.Br. Colonoscopy: 10/2019 polyps; next 2025 BMD:   n/a  Result  n/a TDaP:  up to date through work Gardasil:   no HIV:Neg during preg Hep C:Neg in past Screening Labs:  Today.    reports that she has never smoked. She has never used smokeless tobacco. She reports that she does not drink alcohol and does not use drugs.  Past Medical History:  Diagnosis Date  . Anemia   . Autoimmune disorder (HCC)   . 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  IVP done in 2012.  . Gastric ulcer   . History of COVID-19 12/2020  . Iron deficiency anemia    hx iron infusions with Dr. Myna Hidalgo  . Thyroid disease    hypothyroid    Past Surgical History:  Procedure Laterality Date  . AUGMENTATION MAMMAPLASTY  2004  . BIOPSY  04/21/2019   Procedure: BIOPSY;  Surgeon: Willis Modena, MD;  Location: [facility] ENDOSCOPY;  Service: Endoscopy;;  . BREAST SURGERY     augmentation  . ESOPHAGEAL DILATION     Dr. Ewing Schlein.  Procedure done twice in past.   . ESOPHAGOGASTRODUODENOSCOPY N/A 04/21/2019   Procedure: ESOPHAGOGASTRODUODENOSCOPY (EGD);  Surgeon: Willis Modena, MD;   Location: [facility] ENDOSCOPY;  Service: Endoscopy;  Laterality: N/A;  . EYE SURGERY     lasik  . FOOT FRACTURE SURGERY Left    2020  . LAPAROSCOPIC SUPRACERVICAL HYSTERECTOMY  07/10/2011   Procedure: LAPAROSCOPIC SUPRACERVICAL HYSTERECTOMY;  Surgeon: Melony Overly;  Location: [facility] ORS;  Service: Gynecology;  Laterality: N/A;  Laparoscopic Supracervical Hysterectomy & Cystoscopy  . PROXIMAL INTERPHALANGEAL FUSION (PIP) Left 01/05/2019   Procedure: PROXIMAL INTERPHALANGEAL FUSION (PIP);  Surgeon: Terance Hart, MD;  Location: [facility];  Service: Orthopedics;  Laterality: Left;  . right knee    . vestibuectomy  2003   Dr Edward Jolly    Current Outpatient Medications  Medication Sig Dispense Refill  . Calcium Carb-Cholecalciferol (CALCIUM 500+D3) 500-400 MG-UNIT TABS Take 1 tablet by mouth daily.    Marland Kitchen levothyroxine (SYNTHROID) 200 MCG tablet Take 1 tablet (200 mcg total) by mouth daily before breakfast. 90 tablet 1  . omeprazole (PRILOSEC) 40 MG capsule Take 1 capsule by mouth daily.     No current facility-administered medications for this visit.    Family History  Problem Relation Age of Onset  . Rheum arthritis Mother   . Hypertension Mother   . Migraines Mother   . Hyperlipidemia Mother   . Thyroid disease Mother   . Thyroid disease Father   . Seizures Sister   . Hypertension Maternal Grandmother   .  Hyperlipidemia Maternal Grandmother   . Stroke Maternal Grandmother   . Hypertension Paternal Grandmother   . Stroke Paternal Grandmother     Review of Systems  All other systems reviewed and are negative.   Exam:   BP 124/74   Pulse 70   Resp 16   Ht 5\' 7"  (1.702 m)   Wt 180 lb (81.6 kg)   LMP 06/22/2011   BMI 28.19 kg/m     General appearance: alert, cooperative and appears stated age Head: normocephalic, without obvious abnormality, atraumatic Neck: no adenopathy, supple, symmetrical, trachea midline and thyroid normal to inspection and palpation Lungs:  clear to auscultation bilaterally Breasts: normal appearance, no masses or tenderness, No nipple retraction or dimpling, No nipple discharge or bleeding, No axillary adenopathy Heart: regular rate and rhythm Abdomen: soft, non-tender; no masses, no organomegaly Extremities: extremities normal, atraumatic, no cyanosis or edema Skin: skin color, texture, turgor normal. No rashes or lesions Lymph nodes: cervical, supraclavicular, and axillary nodes normal. Neurologic: grossly normal  Pelvic: External genitalia:  no lesions              No abnormal inguinal nodes palpated.              Urethra:  normal appearing urethra with no masses, tenderness or lesions              Bartholins and Skenes: normal                 Vagina: normal appearing vagina with normal color and discharge, no lesions              Cervix: no lesions              Pap taken: No.  Bimanual Exam:  Uterus: absent.               Adnexa: no mass, fullness, tenderness              Rectal exam: Yes.  .  Confirms.              Anus:  normal sphincter tone, no lesions  Chaperone was present for exam.  Assessment:   Well woman visit with normal exam. Status post supracervical hysterectomy.  Bilateral breast implants.  Hx anemia. Hypothyroidism. Isolated episode of post coital bleeding.   Plan: Mammogram diagnostic and left breast 06/24/2011 in September, 2022 at [facility].  Self breast awareness reviewed. Pap and HR HPV as above. Guidelines for Calcium, Vitamin D, regular exercise program including cardiovascular and weight bearing exercise. Referral to endocrinology for hypothyroidism.  Routine labs today.  Refill of Synthroid for 3 months until patient seen by endocrinology. Follow up annually and prn.

## 2021-03-23 ENCOUNTER — Other Ambulatory Visit: Payer: Self-pay | Admitting: Obstetrics and Gynecology

## 2021-03-23 ENCOUNTER — Ambulatory Visit (INDEPENDENT_AMBULATORY_CARE_PROVIDER_SITE_OTHER): Payer: 59 | Admitting: Obstetrics and Gynecology

## 2021-03-23 ENCOUNTER — Encounter: Payer: Self-pay | Admitting: Obstetrics and Gynecology

## 2021-03-23 ENCOUNTER — Other Ambulatory Visit: Payer: Self-pay

## 2021-03-23 VITALS — BP 124/74 | HR 70 | Resp 16 | Ht 67.0 in | Wt 180.0 lb

## 2021-03-23 DIAGNOSIS — Z01419 Encounter for gynecological examination (general) (routine) without abnormal findings: Secondary | ICD-10-CM | POA: Diagnosis not present

## 2021-03-23 DIAGNOSIS — E039 Hypothyroidism, unspecified: Secondary | ICD-10-CM

## 2021-03-23 MED ORDER — LEVOTHYROXINE SODIUM 200 MCG PO TABS
200.0000 ug | ORAL_TABLET | Freq: Every day | ORAL | 0 refills | Status: DC
Start: 2021-03-23 — End: 2021-03-23

## 2021-03-23 NOTE — Patient Instructions (Signed)

## 2021-03-24 LAB — CBC
HCT: 36.9 % (ref 35.0–45.0)
Hemoglobin: 12.1 g/dL (ref 11.7–15.5)
MCH: 28.3 pg (ref 27.0–33.0)
MCHC: 32.8 g/dL (ref 32.0–36.0)
MCV: 86.2 fL (ref 80.0–100.0)
MPV: 12.4 fL (ref 7.5–12.5)
Platelets: 177 10*3/uL (ref 140–400)
RBC: 4.28 10*6/uL (ref 3.80–5.10)
RDW: 12.6 % (ref 11.0–15.0)
WBC: 6.6 10*3/uL (ref 3.8–10.8)

## 2021-03-24 LAB — LIPID PANEL
Cholesterol: 123 mg/dL (ref <200)
HDL: 36 mg/dL — ABNORMAL LOW (ref >=50)
LDL Cholesterol (Calc): 70 mg/dL (calc)
Non-HDL Cholesterol (Calc): 87 mg/dL (calc) (ref <130)
Total CHOL/HDL Ratio: 3.4 (calc) (ref <5.0)
Triglycerides: 85 mg/dL (ref <150)

## 2021-03-24 LAB — COMPREHENSIVE METABOLIC PANEL
AG Ratio: 1.9 (calc) (ref 1.0–2.5)
ALT: 9 U/L (ref 6–29)
AST: 14 U/L (ref 10–35)
Albumin: 4.3 g/dL (ref 3.6–5.1)
Alkaline phosphatase (APISO): 47 U/L (ref 31–125)
BUN: 8 mg/dL (ref 7–25)
CO2: 23 mmol/L (ref 20–32)
Calcium: 8.9 mg/dL (ref 8.6–10.2)
Chloride: 105 mmol/L (ref 98–110)
Creat: 0.59 mg/dL (ref 0.50–1.10)
Globulin: 2.3 g/dL (calc) (ref 1.9–3.7)
Glucose, Bld: 94 mg/dL (ref 65–99)
Potassium: 4 mmol/L (ref 3.5–5.3)
Sodium: 138 mmol/L (ref 135–146)
Total Bilirubin: 0.4 mg/dL (ref 0.2–1.2)
Total Protein: 6.6 g/dL (ref 6.1–8.1)

## 2021-03-24 LAB — VITAMIN D 25 HYDROXY (VIT D DEFICIENCY, FRACTURES): Vit D, 25-Hydroxy: 34 ng/mL (ref 30–100)

## 2021-04-26 ENCOUNTER — Other Ambulatory Visit: Payer: Self-pay | Admitting: Obstetrics and Gynecology

## 2021-04-26 DIAGNOSIS — Z09 Encounter for follow-up examination after completed treatment for conditions other than malignant neoplasm: Secondary | ICD-10-CM

## 2021-05-15 ENCOUNTER — Ambulatory Visit (INDEPENDENT_AMBULATORY_CARE_PROVIDER_SITE_OTHER): Payer: 59 | Admitting: Endocrinology

## 2021-05-15 ENCOUNTER — Other Ambulatory Visit: Payer: Self-pay

## 2021-05-15 DIAGNOSIS — N912 Amenorrhea, unspecified: Secondary | ICD-10-CM

## 2021-05-15 DIAGNOSIS — E039 Hypothyroidism, unspecified: Secondary | ICD-10-CM | POA: Diagnosis not present

## 2021-05-15 LAB — T4, FREE: Free T4: 1.22 ng/dL (ref 0.60–1.60)

## 2021-05-15 LAB — T3, FREE: T3, Free: 3.3 pg/mL (ref 2.3–4.2)

## 2021-05-15 LAB — TSH: TSH: 0.03 u[IU]/mL — ABNORMAL LOW (ref 0.35–4.50)

## 2021-05-15 NOTE — Patient Instructions (Addendum)
Blood tests are requested for you today.  We'll let you know about the results.   Please come back for a follow-up appointment in 6 months.      Synthroid tablets What is this medicine? SYNTHROID (LEVOTHYROXINE) is a thyroid hormone. This medicine can improve symptoms of thyroid deficiency such as slow speech, lack of energy, weight gain, hair loss, dry skin, and feeling cold. It also helps to treat goiter (an enlarged thyroid gland). It is also used to treat some kinds of thyroid cancer along with surgery and other medicines. This medicine may be used for other purposes; ask your health care provider or pharmacist if you have questions. OTHER COMMON BRAND NAME(S): Sharin Grave, Euthyrox, Levo-T, Levothroid, Levoxyl, Thyro-Tabs, Unithroid What should I tell my health care provider before I take this medicine? They need to know if you have any of these conditions:  Addison's disease or other adrenal gland problem  angina  bone problems  diabetes  dieting or on a weight loss program  fertility problems  heart disease  pituitary gland problem  take medicines that treat or prevent blood clots  an unusual or allergic reaction to levothyroxine, thyroid hormones, other medicines, foods, dyes, or preservatives  pregnant or trying to get pregnant  breast-feeding How should I use this medicine? Take this medicine by mouth with plenty of water. It is best to take on an empty stomach, at least 30 minutes to one hour before breakfast. Avoid taking antacids containing aluminum or magnesium, simethicone, bile acid sequestrants, calcium carbonate, sodium polystyrene sulfonate, ferrous sulfate, sevelamer, lanthanum, or sucralfate within 4 hours of taking this medicine. Follow the directions on the prescription label. Take at the same time each day. Do not take your medicine more often than directed. Contact your pediatrician regarding the use of this medicine in children. While this drug may be  prescribed for children and infants as young as a few days of age for selected conditions, precautions do apply. For infants, you may crush the tablet and place in a small amount of (5 to 10 mL or 1 to 2 teaspoonfuls) of water, breast milk, or non-soy based infant formula. Do not mix with soy-based infant formula. Give as directed. Overdosage: If you think you have taken too much of this medicine contact a poison control center or emergency room at once. NOTE: This medicine is only for you. Do not share this medicine with others. What if I miss a dose? If you miss a dose, take it as soon as you can. If it is almost time for your next dose, take only that dose. Do not take double or extra doses. What may interact with this medicine?  amiodarone  antacids  anti-thyroid medicines  calcium supplements  carbamazepine  certain medicines for depression  certain medicines to treat cancer  cholestyramine  clofibrate  colesevelam  colestipol  digoxin  female hormones, like estrogens and birth control pills, patches, rings, or injections  iron supplements  ketamine  lanthanum  liquid nutrition products like Ensure  lithium  medicines for colds and breathing difficulties  medicines for diabetes  medicines or dietary supplements for weight loss  methadone  niacin  orlistat  oxandrolone  phenobarbital or other barbiturates  phenytoin  rifampin  sevelamer  simethicone  sodium polystyrene sulfonate  soy isoflavones  steroid medicines like prednisone or cortisone  sucralfate  testosterone  theophylline  warfarin This list may not describe all possible interactions. Give your health care provider a list of all the  medicines, herbs, non-prescription drugs, or dietary supplements you use. Also tell them if you smoke, drink alcohol, or use illegal drugs. Some items may interact with your medicine. What should I watch for while using this medicine? Be sure  to take this medicine with plenty of fluids. Some tablets may cause choking, gagging, or difficulty swallowing from the tablet getting stuck in your throat. Most of these problems disappear if the medicine is taken with the right amount of water or other fluids. Do not switch brands of this medicine unless your health care professional agrees with the change. Ask questions if you are uncertain. You will need regular exams and occasional blood tests to check the response to treatment. If you are receiving this medicine for an underactive thyroid, it may be several weeks before you notice an improvement. Check with your doctor or health care professional if your symptoms do not improve. It may be necessary for you to take this medicine for the rest of your life. Do not stop using this medicine unless your doctor or health care professional advises you to. This medicine can affect blood sugar levels. If you have diabetes, check your blood sugar as directed. You may lose some of your hair when you first start treatment. With time, this usually corrects itself. If you are going to have surgery, tell your doctor or health care professional that you are taking this medicine. What side effects may I notice from receiving this medicine? Side effects that you should report to your doctor or health care professional as soon as possible:  allergic reactions like skin rash, itching or hives, swelling of the face, lips, or tongue  anxious  breathing problems  changes in menstrual periods  chest pain  diarrhea  excessive sweating or intolerance to heat  fast or irregular heartbeat  leg cramps  nervousness  swelling of ankles, feet, or legs  tremors  trouble sleeping  vomiting Side effects that usually do not require medical attention (report to your doctor or health care professional if they continue or are bothersome):  changes in appetite  headache  irritable  nausea  weight  loss This list may not describe all possible side effects. Call your doctor for medical advice about side effects. You may report side effects to FDA at 1-800-FDA-1088. Where should I keep my medicine? Keep out of the reach of children. Store at room temperature between 15 and 30 degrees C (59 and 86 degrees F). Protect from light and moisture. Keep container tightly closed. Throw away any unused medicine after the expiration date. NOTE: This sheet is a summary. It may not cover all possible information. If you have questions about this medicine, talk to your doctor, pharmacist, or health care provider.  2021 Elsevier/Gold Standard (2019-12-10 13:39:26)  Hypothyroidism  Hypothyroidism is when the thyroid gland does not make enough of certain hormones (it is underactive). The thyroid gland is a small gland located in the lower front part of the neck, just in front of the windpipe (trachea). This gland makes hormones that help control how the body uses food for energy (metabolism) as well as how the heart and brain function. These hormones also play a role in keeping your bones strong. When the thyroid is underactive, it produces too little of the hormones thyroxine (T4) and triiodothyronine (T3). What are the causes? This condition may be caused by:  Hashimoto's disease. This is a disease in which the body's disease-fighting system (immune system) attacks the thyroid gland. This  is the most common cause.  Viral infections.  Pregnancy.  Certain medicines.  Birth defects.  Past radiation treatments to the head or neck for cancer.  Past treatment with radioactive iodine.  Past exposure to radiation in the environment.  Past surgical removal of part or all of the thyroid.  Problems with a gland in the center of the brain (pituitary gland).  Lack of enough iodine in the diet. What increases the risk? You are more likely to develop this condition if:  You are female.  You have a  family history of thyroid conditions.  You use a medicine called lithium.  You take medicines that affect the immune system (immunosuppressants). What are the signs or symptoms? Symptoms of this condition include:  Feeling as though you have no energy (lethargy).  Not being able to tolerate cold.  Weight gain that is not explained by a change in diet or exercise habits.  Lack of appetite.  Dry skin.  Coarse hair.  Menstrual irregularity.  Slowing of thought processes.  Constipation.  Sadness or depression. How is this diagnosed? This condition may be diagnosed based on:  Your symptoms, your medical history, and a physical exam.  Blood tests. You may also have imaging tests, such as an ultrasound or MRI. How is this treated? This condition is treated with medicine that replaces the thyroid hormones that your body does not make. After you begin treatment, it may take several weeks for symptoms to go away. Follow these instructions at home:  Take over-the-counter and prescription medicines only as told by your health care provider.  If you start taking any new medicines, tell your health care provider.  Keep all follow-up visits as told by your health care provider. This is important. ? As your condition improves, your dosage of thyroid hormone medicine may change. ? You will need to have blood tests regularly so that your health care provider can monitor your condition. Contact a health care provider if:  Your symptoms do not get better with treatment.  You are taking thyroid hormone replacement medicine and you: ? Sweat a lot. ? Have tremors. ? Feel anxious. ? Lose weight rapidly. ? Cannot tolerate heat. ? Have emotional swings. ? Have diarrhea. ? Feel weak. Get help right away if you have:  Chest pain.  An irregular heartbeat.  A rapid heartbeat.  Difficulty breathing. Summary  Hypothyroidism is when the thyroid gland does not make enough of  certain hormones (it is underactive).  When the thyroid is underactive, it produces too little of the hormones thyroxine (T4) and triiodothyronine (T3).  The most common cause is Hashimoto's disease, a disease in which the body's disease-fighting system (immune system) attacks the thyroid gland. The condition can also be caused by viral infections, medicine, pregnancy, or past radiation treatment to the head or neck.  Symptoms may include weight gain, dry skin, constipation, feeling as though you do not have energy, and not being able to tolerate cold.  This condition is treated with medicine to replace the thyroid hormones that your body does not make. This information is not intended to replace advice given to you by your health care provider. Make sure you discuss any questions you have with your health care provider. Document Revised: 09/16/2020 Document Reviewed: 09/01/2020 Elsevier Patient Education  2021 [employer].

## 2021-05-15 NOTE — Progress Notes (Signed)
Subjective:    Patient ID: Kayla Salazar, female    DOB: 03-27-1976, 45 y.o.   MRN: [id number]  HPI Pt is referred by Dr Edward Jolly, for hypothyroidism.  Pt reports hypothyroidism was dx'ed in 2002.  she has been on prescribed thyroid hormone therapy since rx.  She requests brand name rx.  she has never taken kelp or any other type of non-prescribed thyroid product.  she has never had thyroid imaging.  She attributes amenorrhea to TAH, but she requests labs to see if she is menopausal.  she has never had thyroid surgery, or XRT to the neck.  she has never been on amiodarone or lithium.  She has been on her current 200 mcg/d, since 2021.  She reports fatigue, dry skin, and difficulty losing weight.   Past Medical History:  Diagnosis Date  . Anemia   . Autoimmune disorder (HCC)   . 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  IVP done in 2012.  . Gastric ulcer   . History of COVID-19 12/2020  . Iron deficiency anemia    hx iron infusions with Dr. Myna Hidalgo  . Thyroid disease    hypothyroid    Past Surgical History:  Procedure Laterality Date  . AUGMENTATION MAMMAPLASTY  2004  . BIOPSY  04/21/2019   Procedure: BIOPSY;  Surgeon: Willis Modena, MD;  Location: [facility];  Service: Endoscopy;;  . BREAST SURGERY     augmentation  . ESOPHAGEAL DILATION     Dr. Ewing Schlein.  Procedure done twice in past.   . ESOPHAGOGASTRODUODENOSCOPY N/A 04/21/2019   Procedure: ESOPHAGOGASTRODUODENOSCOPY (EGD);  Surgeon: Willis Modena, MD;  Location: [facility] ENDOSCOPY;  Service: Endoscopy;  Laterality: N/A;  . EYE SURGERY     lasik  . FOOT FRACTURE SURGERY Left    2020  . LAPAROSCOPIC SUPRACERVICAL HYSTERECTOMY  07/10/2011   Procedure: LAPAROSCOPIC SUPRACERVICAL HYSTERECTOMY;  Surgeon: Melony Overly;  Location: [facility] ORS;  Service: Gynecology;  Laterality: N/A;  Laparoscopic Supracervical Hysterectomy & Cystoscopy  . PROXIMAL INTERPHALANGEAL FUSION (PIP) Left 01/05/2019   Procedure: PROXIMAL  INTERPHALANGEAL FUSION (PIP);  Surgeon: Terance Hart, MD;  Location: [facility];  Service: Orthopedics;  Laterality: Left;  . right knee    . vestibuectomy  2003   Dr Edward Jolly    Social History   Socioeconomic History  . Marital status: Married    Spouse name: Not on file  . Number of children: Not on file  . Years of education: Not on file  . Highest education level: Not on file  Occupational History  . Not on file  Tobacco Use  . Smoking status: Never Smoker  . Smokeless tobacco: Never Used  Vaping Use  . Vaping Use: Never used  Substance and Sexual Activity  . Alcohol use: No    Alcohol/week: 0.0 standard drinks  . Drug use: No  . Sexual activity: Yes    Partners: Male    Birth control/protection: Surgical    Comment: supracervical hyst 2012  Other Topics Concern  . Not on file  Social History Narrative   Lives with husband and two daughters   Social Determinants of Health   Financial Resource Strain: Not on file  Food Insecurity: Not on file  Transportation Needs: Not on file  Physical Activity: Not on file  Stress: Not on file  Social Connections: Not on file  Intimate Partner Violence: Not on file    Current Outpatient Medications on File Prior to Visit  Medication  Sig Dispense Refill  . Calcium Carb-Cholecalciferol (CALCIUM 500+D3) 500-400 MG-UNIT TABS Take 1 tablet by mouth daily.    . fluconazole (DIFLUCAN) 150 MG tablet TAKE 1 TABLET BY MOUTH ONCE FOR 1 DOSE. REPEAT IN 72 HOURS IF SYMPTOMS ARE NOT COMPLETELY RESOLVED. 2 tablet 0  . omeprazole (PRILOSEC) 40 MG capsule Take 1 capsule by mouth daily.    Marland Kitchen omeprazole (PRILOSEC) 40 MG capsule TAKE 1 CAPSULE BY MOUTH EVERY MORNING 30 MINUTES BEFORE MEAL 90 capsule 2   No current facility-administered medications on file prior to visit.    Allergies  Allergen Reactions  . Adhesive [Tape]   . Codeine     REACTION: Hives/itching/sweats  . Latex Itching    Family History  Problem  Relation Age of Onset  . Rheum arthritis Mother   . Hypertension Mother   . Migraines Mother   . Hyperlipidemia Mother   . Thyroid disease Mother   . Thyroid disease Father   . Seizures Sister   . Hypertension Maternal Grandmother   . Hyperlipidemia Maternal Grandmother   . Stroke Maternal Grandmother   . Hypertension Paternal Grandmother   . Stroke Paternal Grandmother     BP 120/60 (BP Location: Right Arm, Patient Position: Sitting, Cuff Size: Normal)   Pulse 83   Ht 5\' 7"  (1.702 m)   Wt 176 lb 6.4 oz (80 kg)   LMP 06/22/2011   SpO2 96%   BMI 27.63 kg/m    Review of Systems denies depression, numbness, and dry skin.  She has chronic constipation.       Objective:   Physical Exam VS: see vs page GEN: no distress HEAD: head: no deformity eyes: no periorbital swelling, no proptosis.   external nose and ears are normal NECK: thyroid is not enlarged, but the surface is irregular.   CHEST WALL: no deformity LUNGS: clear to auscultation CV: reg rate and rhythm, no murmur.  MUSCULOSKELETAL: gait is normal and steady EXTEMITIES: no deformity.  no leg edema.   NEURO:  readily moves all 4's.  sensation is intact to touch on all 4's SKIN:  Normal texture and temperature.  No rash or suspicious lesion is visible.   NODES:  None palpable at the neck PSYCH: alert, well-oriented.  Does not appear anxious nor depressed.  Lab Results  Component Value Date   TSH 0.52 01/10/2021   T4TOTAL 6.5 06/24/2018   I have reviewed outside records, and summarized: Pt was noted to have hypothyroidism, and referred here.  Wellness was also addressed, and general health was good.   Lab Results  Component Value Date   TSH 0.03 (L) 05/15/2021   T4TOTAL 6.5 06/24/2018      Assessment & Plan:  Hypothyroidism, overcontrolled.  Reduce synthroid

## 2021-05-16 ENCOUNTER — Other Ambulatory Visit (HOSPITAL_COMMUNITY): Payer: Self-pay

## 2021-05-16 LAB — LUTEINIZING HORMONE: LH: 2.66 m[IU]/mL

## 2021-05-16 LAB — FOLLICLE STIMULATING HORMONE: FSH: 4.1 m[IU]/mL

## 2021-05-16 MED ORDER — LEVOTHYROXINE SODIUM 175 MCG PO TABS
175.0000 ug | ORAL_TABLET | Freq: Every day | ORAL | 3 refills | Status: DC
  Filled 2021-05-16: qty 90, 90d supply, fill #0

## 2021-05-18 ENCOUNTER — Other Ambulatory Visit (HOSPITAL_COMMUNITY): Payer: Self-pay

## 2021-05-18 ENCOUNTER — Other Ambulatory Visit (HOSPITAL_COMMUNITY): Payer: Self-pay | Admitting: Gastroenterology

## 2021-05-19 ENCOUNTER — Other Ambulatory Visit (HOSPITAL_COMMUNITY): Payer: Self-pay

## 2021-05-26 LAB — ESTRADIOL, FREE
Estradiol, Free: 1.42 pg/mL
Estradiol: 97 pg/mL

## 2021-06-01 ENCOUNTER — Other Ambulatory Visit (HOSPITAL_COMMUNITY): Payer: Self-pay

## 2021-06-01 MED ORDER — CARESTART COVID-19 HOME TEST VI KIT
PACK | 0 refills | Status: DC
  Filled 2021-06-01: qty 4, 4d supply, fill #0

## 2021-06-26 ENCOUNTER — Other Ambulatory Visit: Payer: Self-pay

## 2021-06-26 ENCOUNTER — Other Ambulatory Visit (INDEPENDENT_AMBULATORY_CARE_PROVIDER_SITE_OTHER): Payer: 59

## 2021-06-26 DIAGNOSIS — E039 Hypothyroidism, unspecified: Secondary | ICD-10-CM | POA: Diagnosis not present

## 2021-06-26 LAB — TSH: TSH: 0.11 u[IU]/mL — ABNORMAL LOW (ref 0.35–4.50)

## 2021-06-27 ENCOUNTER — Encounter: Payer: Self-pay | Admitting: Endocrinology

## 2021-06-27 ENCOUNTER — Other Ambulatory Visit: Payer: Self-pay | Admitting: Endocrinology

## 2021-06-27 ENCOUNTER — Other Ambulatory Visit (HOSPITAL_COMMUNITY): Payer: Self-pay

## 2021-06-27 LAB — T4, FREE: Free T4: 1.08 ng/dL (ref 0.60–1.60)

## 2021-06-27 MED ORDER — LEVOTHYROXINE SODIUM 150 MCG PO TABS
150.0000 ug | ORAL_TABLET | Freq: Every day | ORAL | 3 refills | Status: DC
  Filled 2021-06-27: qty 90, 90d supply, fill #0
  Filled 2021-11-08: qty 90, 90d supply, fill #1

## 2021-07-05 ENCOUNTER — Other Ambulatory Visit (HOSPITAL_COMMUNITY): Payer: Self-pay

## 2021-07-05 DIAGNOSIS — M25531 Pain in right wrist: Secondary | ICD-10-CM | POA: Diagnosis not present

## 2021-07-05 MED ORDER — PREDNISONE 10 MG (21) PO TBPK
ORAL_TABLET | ORAL | 0 refills | Status: DC
  Filled 2021-07-05: qty 21, 6d supply, fill #0

## 2021-07-17 ENCOUNTER — Telehealth: Payer: 59 | Admitting: Nurse Practitioner

## 2021-07-17 ENCOUNTER — Other Ambulatory Visit (HOSPITAL_COMMUNITY): Payer: Self-pay

## 2021-07-17 DIAGNOSIS — J329 Chronic sinusitis, unspecified: Secondary | ICD-10-CM

## 2021-07-17 MED ORDER — AMOXICILLIN-POT CLAVULANATE 875-125 MG PO TABS
1.0000 | ORAL_TABLET | Freq: Two times a day (BID) | ORAL | 0 refills | Status: AC
  Filled 2021-07-17: qty 14, 7d supply, fill #0

## 2021-07-17 NOTE — Progress Notes (Signed)
E-Visit for Sinus Problems  We are sorry that you are not feeling well.  Here is how we plan to help!  Based on what you have shared with me it looks like you have sinusitis.  Sinusitis is inflammation and infection in the sinus cavities of the head.  Based on your presentation I believe you most likely have Acute Bacterial Sinusitis.  This is an infection caused by bacteria and is treated with antibiotics. I have prescribed Augmentin 875mg /125mg  one tablet twice daily with food, for 7 days. You may use an oral decongestant such as Mucinex D or if you have glaucoma or high blood pressure use plain Mucinex. Saline nasal spray help and can safely be used as often as needed for congestion.  If you develop worsening sinus pain, fever or notice severe headache and vision changes, or if symptoms are not better after completion of antibiotic, please schedule an appointment with a health care provider.    If you feel that you are developing any difficulty breathing, shortness of breath or wheezing we advise you are seen in person at an urgent care or at your primary care doctor's office. Sinus infections are secondary infections that commonly happen after viral infections. If other respiratory symptoms onset we want to assure you are evaluated in person as needed.   Sinus infections are not as easily transmitted as other respiratory infection, however we still recommend that you avoid close contact with loved ones, especially the very young and elderly.  Remember to wash your hands thoroughly throughout the day as this is the number one way to prevent the spread of infection!  Home Care: Only take medications as instructed by your medical team. Complete the entire course of an antibiotic. Do not take these medications with alcohol. A steam or ultrasonic humidifier can help congestion.  You can place a towel over your head and breathe in the steam from hot water coming from a faucet. Avoid close contacts  especially the very young and the elderly. Cover your mouth when you cough or sneeze. Always remember to wash your hands.  Get Help Right Away If: You develop worsening fever or sinus pain. You develop a severe head ache or visual changes. Your symptoms persist after you have completed your treatment plan.  Make sure you Understand these instructions. Will watch your condition. Will get help right away if you are not doing well or get worse.  Thank you for choosing an e-visit.  Your e-visit answers were reviewed by a board certified advanced clinical practitioner to complete your personal care plan. Depending upon the condition, your plan could have included both over the counter or prescription medications.  Please review your pharmacy choice. Make sure the pharmacy is open so you can pick up prescription now. If there is a problem, you may contact your provider through and have the prescription routed to another pharmacy.  Your safety is important to [employer]. If you have drug allergies check your prescription carefully.   For the next 24 hours you can use MyChart to ask questions about today's visit, request a non-urgent call back, or ask for a work or school excuse. You will get an email in the next two days asking about your experience. I hope that your e-visit has been valuable and will speed your recovery.   Meds ordered this encounter  Medications   amoxicillin-clavulanate (AUGMENTIN) 875-125 MG tablet    Sig: Take 1 tablet by mouth 2 (two) times daily for  7 days. Take with food    Dispense:  14 tablet    Refill:  0     I spent approximately 7 minutes reviewing the patient's history, current symptoms and coordinating their plan of care today.

## 2021-08-14 ENCOUNTER — Encounter: Payer: Self-pay | Admitting: Internal Medicine

## 2021-08-14 ENCOUNTER — Other Ambulatory Visit: Payer: Self-pay

## 2021-08-14 ENCOUNTER — Ambulatory Visit (INDEPENDENT_AMBULATORY_CARE_PROVIDER_SITE_OTHER): Payer: 59 | Admitting: Internal Medicine

## 2021-08-14 VITALS — BP 118/72 | HR 64 | Temp 98.2°F | Resp 18 | Ht 67.0 in | Wt 178.6 lb

## 2021-08-14 DIAGNOSIS — Z0000 Encounter for general adult medical examination without abnormal findings: Secondary | ICD-10-CM | POA: Diagnosis not present

## 2021-08-14 DIAGNOSIS — K219 Gastro-esophageal reflux disease without esophagitis: Secondary | ICD-10-CM

## 2021-08-14 DIAGNOSIS — E039 Hypothyroidism, unspecified: Secondary | ICD-10-CM | POA: Diagnosis not present

## 2021-08-14 NOTE — Patient Instructions (Signed)
You can check on the tetanus.

## 2021-08-14 NOTE — Progress Notes (Signed)
   Subjective:   Patient ID: Kayla Salazar, female    DOB: [birth year], 45 y.o.   MRN: [id number]  HPI The patient is a 45 YO female coming in new without concerns for physical  PMH, [facility], social history reviewed and updated  Review of Systems  Constitutional: Negative.   HENT: Negative.    Eyes: Negative.   Respiratory:  Negative for cough, chest tightness and shortness of breath.   Cardiovascular:  Negative for chest pain, palpitations and leg swelling.  Gastrointestinal:  Negative for abdominal distention, abdominal pain, constipation, diarrhea, nausea and vomiting.  Musculoskeletal: Negative.   Skin: Negative.   Neurological: Negative.   Psychiatric/Behavioral: Negative.     Objective:  Physical Exam Constitutional:      Appearance: She is well-developed.  HENT:     Head: Normocephalic and atraumatic.  Cardiovascular:     Rate and Rhythm: Normal rate and regular rhythm.  Pulmonary:     Effort: Pulmonary effort is normal. No respiratory distress.     Breath sounds: Normal breath sounds. No wheezing or rales.  Abdominal:     General: Bowel sounds are normal. There is no distension.     Palpations: Abdomen is soft.     Tenderness: There is no abdominal tenderness. There is no rebound.  Musculoskeletal:     Cervical back: Normal range of motion.  Skin:    General: Skin is warm and dry.  Neurological:     Mental Status: She is alert and oriented to person, place, and time.     Coordination: Coordination normal.    Vitals:   08/14/21 1607  BP: 118/72  Pulse: 64  Resp: 18  Temp: 98.2 F (36.8 C)  TempSrc: Oral  SpO2: 98%  Weight: 178 lb 9.6 oz (81 kg)  Height: 5\' 7"  (1.702 m)    This visit occurred during the SARS-CoV-2 public health emergency.  Safety protocols were in place, including screening questions prior to the visit, additional usage of staff PPE, and extensive cleaning of exam room while observing appropriate contact time as indicated for disinfecting  solutions.   Assessment & Plan:

## 2021-08-15 ENCOUNTER — Encounter: Payer: Self-pay | Admitting: Internal Medicine

## 2021-08-15 DIAGNOSIS — Z0000 Encounter for general adult medical examination without abnormal findings: Secondary | ICD-10-CM | POA: Insufficient documentation

## 2021-08-15 NOTE — Assessment & Plan Note (Signed)
Prior ulcers and has had EGD in the past for esophageal stricture. Was taking omeprazole and did not want to continue this long term. Knows warning signs and will monitor for them. No clinical signs of GERD.

## 2021-08-15 NOTE — Assessment & Plan Note (Signed)
Flu shot yearly. Covid-19 due for booster. Tetanus unclear if up to date she will check her records. Colonoscopy getting records from [facility] to find out last colonoscopy date. She gets every 5 years. Mammogram up to date, pap smear up to date. Counseled about sun safety and mole surveillance. Counseled about the dangers of distracted driving. Given 10 year screening recommendations.

## 2021-08-15 NOTE — Assessment & Plan Note (Signed)
Recent decrease in dosing to 150 mcg daily with endo for low TSH. She has not noticed clinical difference with reduction. Due for labs in a few months.

## 2021-09-12 ENCOUNTER — Other Ambulatory Visit: Payer: Self-pay | Admitting: Obstetrics and Gynecology

## 2021-09-12 DIAGNOSIS — N63 Unspecified lump in unspecified breast: Secondary | ICD-10-CM

## 2021-09-14 ENCOUNTER — Other Ambulatory Visit (HOSPITAL_COMMUNITY): Payer: Self-pay

## 2021-09-15 ENCOUNTER — Ambulatory Visit
Admission: RE | Admit: 2021-09-15 | Discharge: 2021-09-15 | Disposition: A | Discharge status: Home / Self Care | Payer: 59 | Source: Ambulatory Visit | Attending: Obstetrics and Gynecology | Admitting: Obstetrics and Gynecology

## 2021-09-15 ENCOUNTER — Other Ambulatory Visit: Payer: Self-pay

## 2021-09-15 ENCOUNTER — Other Ambulatory Visit: Payer: 59

## 2021-09-15 DIAGNOSIS — N63 Unspecified lump in unspecified breast: Secondary | ICD-10-CM

## 2021-09-15 DIAGNOSIS — R922 Inconclusive mammogram: Secondary | ICD-10-CM | POA: Diagnosis not present

## 2021-09-15 DIAGNOSIS — N6323 Unspecified lump in the left breast, lower outer quadrant: Secondary | ICD-10-CM | POA: Diagnosis not present

## 2021-09-28 ENCOUNTER — Encounter: Payer: Self-pay | Admitting: Internal Medicine

## 2021-09-28 ENCOUNTER — Other Ambulatory Visit (HOSPITAL_COMMUNITY): Payer: Self-pay

## 2021-09-28 MED ORDER — FLUCONAZOLE 150 MG PO TABS
ORAL_TABLET | ORAL | 1 refills | Status: DC
  Filled 2021-09-28: qty 2, 3d supply, fill #0

## 2021-11-01 ENCOUNTER — Other Ambulatory Visit (HOSPITAL_COMMUNITY): Payer: Self-pay

## 2021-11-01 DIAGNOSIS — M25562 Pain in left knee: Secondary | ICD-10-CM | POA: Diagnosis not present

## 2021-11-01 MED ORDER — MELOXICAM 15 MG PO TABS
15.0000 mg | ORAL_TABLET | Freq: Every day | ORAL | 0 refills | Status: DC
  Filled 2021-11-01: qty 30, 30d supply, fill #0

## 2021-11-08 ENCOUNTER — Other Ambulatory Visit (HOSPITAL_COMMUNITY): Payer: Self-pay

## 2021-11-14 DIAGNOSIS — M222X2 Patellofemoral disorders, left knee: Secondary | ICD-10-CM | POA: Diagnosis not present

## 2021-11-14 DIAGNOSIS — R531 Weakness: Secondary | ICD-10-CM | POA: Diagnosis not present

## 2021-11-14 DIAGNOSIS — M25672 Stiffness of left ankle, not elsewhere classified: Secondary | ICD-10-CM | POA: Diagnosis not present

## 2021-11-16 ENCOUNTER — Other Ambulatory Visit (HOSPITAL_COMMUNITY): Payer: Self-pay

## 2021-11-20 ENCOUNTER — Ambulatory Visit: Payer: 59 | Admitting: Endocrinology

## 2021-11-21 ENCOUNTER — Encounter: Payer: Self-pay | Admitting: Endocrinology

## 2021-11-21 NOTE — Telephone Encounter (Signed)
Patient has now been rescheduled  

## 2021-12-14 ENCOUNTER — Ambulatory Visit (INDEPENDENT_AMBULATORY_CARE_PROVIDER_SITE_OTHER): Payer: 59 | Admitting: Endocrinology

## 2021-12-14 ENCOUNTER — Other Ambulatory Visit: Payer: Self-pay

## 2021-12-14 VITALS — BP 140/64 | HR 60 | Ht 67.0 in | Wt 185.8 lb

## 2021-12-14 DIAGNOSIS — E039 Hypothyroidism, unspecified: Secondary | ICD-10-CM | POA: Diagnosis not present

## 2021-12-14 NOTE — Patient Instructions (Addendum)
Blood tests are requested for you today.  We'll let you know about the results.   Please come back for a follow-up appointment in 6-12 months, with blood tests prior.

## 2021-12-14 NOTE — Progress Notes (Signed)
Subjective:    Patient ID: Kayla Salazar, female    DOB: 11/07/1976, 45 y.o.   MRN: [id number]  HPI Pt returns for f/u of hypothyroidism (dx'ed 2002; she has been on prescribed thyroid hormone therapy since rx; she requests brand name; she has never had thyroid imaging; she has had AH; labs do not indicate menopause).  Since the synthroid was reduced, pt states she feels no different, and well in general.  She prefers to have lab appts rather than ov's.  She takes synthroid as rx'ed, with water only.   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  IVP done in 2012.   Gastric ulcer    History of COVID-19 12/2020   Iron deficiency anemia    hx iron infusions with Dr. Myna Hidalgo   Thyroid disease    hypothyroid    Past Surgical History:  Procedure Laterality Date   AUGMENTATION MAMMAPLASTY  2004   BIOPSY  04/21/2019   Procedure: BIOPSY;  Surgeon: Willis Modena, MD;  Location: [facility] ENDOSCOPY;  Service: Endoscopy;;   BREAST SURGERY     augmentation   ESOPHAGEAL DILATION     Dr. Ewing Schlein.  Procedure done twice in past.    ESOPHAGOGASTRODUODENOSCOPY N/A 04/21/2019   Procedure: ESOPHAGOGASTRODUODENOSCOPY (EGD);  Surgeon: Willis Modena, MD;  Location: [facility] ENDOSCOPY;  Service: Endoscopy;  Laterality: N/A;   EYE SURGERY     lasik   FOOT FRACTURE SURGERY Left    2020   LAPAROSCOPIC SUPRACERVICAL HYSTERECTOMY  07/10/2011   Procedure: LAPAROSCOPIC SUPRACERVICAL HYSTERECTOMY;  Surgeon: Melony Overly;  Location: [facility];  Service: Gynecology;  Laterality: N/A;  Laparoscopic Supracervical Hysterectomy & Cystoscopy   PROXIMAL INTERPHALANGEAL FUSION (PIP) Left 01/05/2019   Procedure: PROXIMAL INTERPHALANGEAL FUSION (PIP);  Surgeon: Terance Hart, MD;  Location: [facility];  Service: Orthopedics;  Laterality: Left;   right knee     vestibuectomy  2003   Dr Edward Jolly    Social History    Socioeconomic History   Marital status: Married    Spouse name: Not on file   Number of children: Not on file   Years of education: Not on file   Highest education level: Not on file  Occupational History   Not on file  Tobacco Use   Smoking status: Never   Smokeless tobacco: Never  Vaping Use   Vaping Use: Never used  Substance and Sexual Activity   Alcohol use: No    Alcohol/week: 0.0 standard drinks   Drug use: No   Sexual activity: Yes    Partners: Male    Birth control/protection: Surgical    Comment: supracervical hyst 2012  Other Topics Concern   Not on file  Social History Narrative   Lives with husband and two daughters   Social Determinants of Health   Financial Resource Strain: Not on file  Food Insecurity: Not on file  Transportation Needs: Not on file  Physical Activity: Not on file  Stress: Not on file  Social Connections: Not on file  Intimate Partner Violence: Not on file    Current Outpatient Medications on File Prior to Visit  Medication Sig Dispense Refill   Calcium Carb-Cholecalciferol (CALCIUM 500+D3) 500-400 MG-UNIT TABS Take 1 tablet by mouth daily.     fluconazole (DIFLUCAN) 150 MG tablet Take 1 tablet by mouth every 3 days as needed 2 tablet 1  levothyroxine (SYNTHROID) 150 MCG tablet Take 1 tablet (150 mcg total) by mouth daily. 90 tablet 3   meloxicam (MOBIC) 15 MG tablet Take 1 tablet by mouth daily with food 30 tablet 0   No current facility-administered medications on file prior to visit.    Allergies  Allergen Reactions   Adhesive [Tape]    Codeine     REACTION: Hives/itching/sweats   Latex Itching    Family History  Problem Relation Age of Onset   Rheum arthritis Mother    Hypertension Mother    Migraines Mother    Hyperlipidemia Mother    Thyroid disease Mother    Thyroid disease Father    Seizures Sister    Hypertension Maternal Grandmother    Hyperlipidemia Maternal Grandmother    Stroke Maternal Grandmother     Hypertension Paternal Grandmother    Stroke Paternal Grandmother     BP 140/64    Pulse 60    Ht 5\' 7"  (1.702 m)    Wt 185 lb 12.8 oz (84.3 kg)    LMP 06/22/2011    SpO2 97%    BMI 29.10 kg/m    Review of Systems     Objective:   Physical Exam VITAL SIGNS:  See vs page GENERAL: no distress. NECK: There is no palpable thyroid enlargement.  No thyroid nodule is palpable.  No palpable lymphadenopathy at the anterior neck.    Lab Results  Component Value Date   TSH 9.13 (H) 12/14/2021   T4TOTAL 6.5 06/24/2018      Assessment & Plan:  Hypothyroidism: uncontrolled.  I asked pt if she knows why TFT are so variable.  If no reason known, we'll re-increase synthroid.

## 2021-12-15 ENCOUNTER — Encounter: Payer: Self-pay | Admitting: Endocrinology

## 2021-12-15 LAB — T4, FREE: Free T4: 0.58 ng/dL — ABNORMAL LOW (ref 0.60–1.60)

## 2021-12-15 LAB — TSH: TSH: 9.13 u[IU]/mL — ABNORMAL HIGH (ref 0.35–5.50)

## 2021-12-18 ENCOUNTER — Other Ambulatory Visit (HOSPITAL_COMMUNITY): Payer: Self-pay

## 2021-12-18 ENCOUNTER — Other Ambulatory Visit: Payer: Self-pay | Admitting: Endocrinology

## 2021-12-18 MED ORDER — LEVOTHYROXINE SODIUM 175 MCG PO TABS
175.0000 ug | ORAL_TABLET | Freq: Every day | ORAL | 3 refills | Status: DC
  Filled 2021-12-18 – 2021-12-29 (×2): qty 90, 90d supply, fill #0
  Filled 2022-04-30: qty 90, 90d supply, fill #1
  Filled 2022-07-24 – 2022-08-06 (×2): qty 90, 90d supply, fill #2
  Filled 2022-11-14: qty 90, 90d supply, fill #3

## 2021-12-28 ENCOUNTER — Other Ambulatory Visit (HOSPITAL_COMMUNITY): Payer: Self-pay

## 2021-12-29 ENCOUNTER — Other Ambulatory Visit (HOSPITAL_COMMUNITY): Payer: Self-pay

## 2022-01-02 ENCOUNTER — Encounter: Payer: Self-pay | Admitting: Endocrinology

## 2022-01-08 DIAGNOSIS — M359 Systemic involvement of connective tissue, unspecified: Secondary | ICD-10-CM | POA: Diagnosis not present

## 2022-01-08 DIAGNOSIS — Z6828 Body mass index (BMI) 28.0-28.9, adult: Secondary | ICD-10-CM | POA: Diagnosis not present

## 2022-01-08 DIAGNOSIS — E663 Overweight: Secondary | ICD-10-CM | POA: Diagnosis not present

## 2022-01-08 DIAGNOSIS — M2550 Pain in unspecified joint: Secondary | ICD-10-CM | POA: Diagnosis not present

## 2022-03-15 ENCOUNTER — Encounter: Payer: Self-pay | Admitting: Internal Medicine

## 2022-03-22 ENCOUNTER — Ambulatory Visit: Payer: 59 | Admitting: Internal Medicine

## 2022-03-22 ENCOUNTER — Other Ambulatory Visit: Payer: Self-pay

## 2022-03-22 ENCOUNTER — Encounter: Payer: Self-pay | Admitting: Internal Medicine

## 2022-03-22 ENCOUNTER — Other Ambulatory Visit (HOSPITAL_COMMUNITY): Payer: Self-pay

## 2022-03-22 VITALS — BP 116/64 | HR 61 | Resp 18 | Ht 67.0 in | Wt 186.0 lb

## 2022-03-22 DIAGNOSIS — M549 Dorsalgia, unspecified: Secondary | ICD-10-CM

## 2022-03-22 DIAGNOSIS — E039 Hypothyroidism, unspecified: Secondary | ICD-10-CM

## 2022-03-22 LAB — TSH: TSH: 2.84 u[IU]/mL (ref 0.35–5.50)

## 2022-03-22 LAB — T4, FREE: Free T4: 1.05 ng/dL (ref 0.60–1.60)

## 2022-03-22 MED ORDER — MUPIROCIN 2 % EX OINT
1.0000 "application " | TOPICAL_OINTMENT | Freq: Two times a day (BID) | CUTANEOUS | 0 refills | Status: DC
  Filled 2022-03-22: qty 22, 11d supply, fill #0

## 2022-03-22 NOTE — Patient Instructions (Signed)
We will check the labs today and have sent in the ointment. ? ?We will have you get in for the dry needling and do warm compresses for the eye. ?

## 2022-03-22 NOTE — Progress Notes (Signed)
? ?  Subjective:  ? ?Patient ID: Kayla Salazar, female    DOB: 10-16-1976, 46 y.o.   MRN: [id number] ? ?HPI ?The patient is a 46 YO female coming in for neck pain. ? ?Review of Systems  ?Constitutional: Negative.   ?HENT: Negative.    ?Eyes: Negative.   ?Respiratory:  Negative for cough, chest tightness and shortness of breath.   ?Cardiovascular:  Negative for chest pain, palpitations and leg swelling.  ?Gastrointestinal:  Negative for abdominal distention, abdominal pain, constipation, diarrhea, nausea and vomiting.  ?Musculoskeletal:  Positive for myalgias and neck pain.  ?Skin: Negative.   ?Neurological: Negative.   ?Psychiatric/Behavioral: Negative.    ? ?Objective:  ?Physical Exam ?Constitutional:   ?   Appearance: She is well-developed.  ?HENT:  ?   Head: Normocephalic and atraumatic.  ?Cardiovascular:  ?   Rate and Rhythm: Normal rate and regular rhythm.  ?Pulmonary:  ?   Effort: Pulmonary effort is normal. No respiratory distress.  ?   Breath sounds: Normal breath sounds. No wheezing or rales.  ?Abdominal:  ?   General: Bowel sounds are normal. There is no distension.  ?   Palpations: Abdomen is soft.  ?   Tenderness: There is no abdominal tenderness. There is no rebound.  ?Musculoskeletal:     ?   General: Tenderness present.  ?   Cervical back: Normal range of motion.  ?Skin: ?   General: Skin is warm and dry.  ?Neurological:  ?   Mental Status: She is alert and oriented to person, place, and time.  ?   Coordination: Coordination normal.  ? ? ?Vitals:  ? 03/22/22 1052  ?BP: 116/64  ?Pulse: 61  ?Resp: 18  ?SpO2: 99%  ?Weight: 186 lb (84.4 kg)  ?Height: 5\' 7"  (1.702 m)  ? ? ?This visit occurred during the SARS-CoV-2 public health emergency.  Safety protocols were in place, including screening questions prior to the visit, additional usage of staff PPE, and extensive cleaning of exam room while observing appropriate contact time as indicated for disinfecting solutions.  ? ?Assessment & Plan:  ? ?

## 2022-03-23 NOTE — Assessment & Plan Note (Signed)
Due for labs today will order TSH and free T4 and adjust synthroid 175 mcg daily as needed.  ?

## 2022-03-23 NOTE — Assessment & Plan Note (Signed)
Referral to PT for dry needling. She does massage regularly to help with muscle stiffness.  ?

## 2022-03-26 ENCOUNTER — Ambulatory Visit: Payer: 59 | Admitting: Obstetrics and Gynecology

## 2022-04-04 ENCOUNTER — Ambulatory Visit: Payer: 59

## 2022-04-11 NOTE — Therapy (Signed)
?OUTPATIENT PHYSICAL THERAPY THORACOLUMBAR EVALUATION ? ? ?Patient Name: Kayla Salazar ?MRN: [id number] ?DOB:1976-12-16, 46 y.o., female ?Today's Date: 04/12/2022 ? ? PT End of Session - 04/12/22 1735   ? ? Visit Number 1   ? Date for PT Re-Evaluation 06/07/22   ? Authorization Type [facility]   ? PT Start Time 1530   ? PT Stop Time 1612   ? PT Time Calculation (min) 42 min   ? Activity Tolerance Patient tolerated treatment well   ? ?  ?  ? ?  ? ? ?Past Medical History:  ?Diagnosis Date  ? Anemia   ? Autoimmune disorder (HCC)   ? Duplication of bilateral ureters 2012  ? Congenital bilateral.  IVP done in 2012.  ? Gastric ulcer   ? History of COVID-19 12/2020  ? Iron deficiency anemia   ? hx iron infusions with Dr. Myna Hidalgo  ? Thyroid disease   ? hypothyroid  ? ?Past Surgical History:  ?Procedure Laterality Date  ? AUGMENTATION MAMMAPLASTY  2004  ? BIOPSY  04/21/2019  ? Procedure: BIOPSY;  Surgeon: Willis Modena, MD;  Location: [facility] ENDOSCOPY;  Service: Endoscopy;;  ? BREAST SURGERY    ? augmentation  ? ESOPHAGEAL DILATION    ? Dr. Ewing Schlein.  Procedure done twice in past.   ? ESOPHAGOGASTRODUODENOSCOPY N/A 04/21/2019  ? Procedure: ESOPHAGOGASTRODUODENOSCOPY (EGD);  Surgeon: Willis Modena, MD;  Location: [facility] ENDOSCOPY;  Service: Endoscopy;  Laterality: N/A;  ? EYE SURGERY    ? lasik  ? FOOT FRACTURE SURGERY Left   ? 2020  ? LAPAROSCOPIC SUPRACERVICAL HYSTERECTOMY  07/10/2011  ? Procedure: LAPAROSCOPIC SUPRACERVICAL HYSTERECTOMY;  Surgeon: Melony Overly;  Location: [facility];  Service: Gynecology;  Laterality: N/A;  Laparoscopic Supracervical Hysterectomy & Cystoscopy  ? PROXIMAL INTERPHALANGEAL FUSION (PIP) Left 01/05/2019  ? Procedure: PROXIMAL INTERPHALANGEAL FUSION (PIP);  Surgeon: Terance Hart, MD;  Location: [facility];  Service: Orthopedics;  Laterality: Left;  ? right knee    ? vestibuectomy  2003  ? Dr Edward Jolly  ? ?Patient Active Problem List  ? Diagnosis Date Noted  ? Routine general medical examination  at a health care facility 08/15/2021  ? Hypothyroidism 05/15/2021  ? Amenorrhea 05/15/2021  ? OVERWEIGHT 07/07/2008  ? GERD 07/07/2008  ? IBS 07/07/2008  ? Upper back pain on left side 07/07/2008  ? SLEEPINESS 07/07/2008  ? ? ?PCP: Myrlene Broker, MD ? ?REFERRING PROVIDER: Myrlene Broker, * ? ?REFERRING DIAG: M54.9 (ICD-10-CM) - Upper back pain on left side ? ?THERAPY DIAG:  upper back pain; neck pain; spasm; weakness ? ? ?ONSET DATE: 12/31/21 ? ?SUBJECTIVE:                                                                                                                                                                                          ? ?  SUBJECTIVE STATEMENT: ?Works at the cancer center.  50% patient therapy radiation therapy, 50% documentation;  crick in neck and upper back present for years;  left > right medial scapula and upper trap; suboccipitals  ?PERTINENT HISTORY:  ?History of TMJ; right elbow pain with yardwork ? ?PAIN:  ?PAIN:  ?Are you having pain? Yes ?NPRS scale: 3/10 ?Pain location: left medial scapula ?Aggravating factors: stress, postures at work especially sitting; looking down at phone; reaching into the back seat ?Relieving factors: massage  ?PRECAUTIONS: None ? ?WEIGHT BEARING RESTRICTIONS No ? ?FALLS:  ?Has patient fallen in last 6 months? No ? ?LIVING ENVIRONMENT: ?Lives with: lives with their family ?OCCUPATION: radiation tech ? ?PLOF: Independent ? ?PATIENT GOALS less of an issue with this problem; not a constant knot; learn things for my posture to reduce pull on shoulders  ? ? ?OBJECTIVE:  ? ?DIAGNOSTIC FINDINGS:  ?none ? ?PATIENT SURVEYS:  ?FOTO 63% ? ?COGNITION: ? Overall cognitive status: Within functional limits for tasks assessed   ?  ?SENSATION: ?WFL ? ? ?POSTURE:  ?Decreased left scapular mobility and dissociation; increased scapular elevation on left with arms overhead ? ?PALPATION: ?Numerous tender points in bil upper traps and suboccipitals  ? ?CERVICAL ROM:     ? ?Active  A/PROM  ?04/12/22  ?Flexion WFLS  ?Extension 60  ?Right lateral flexion 40  ?Left lateral flexion 35  ?Right rotation 45  ?Left rotation 30  ? (Blank rows = not tested) ? ?Decreased thoracic extension and rotation bil;  UE ROM WNLS throughout ? ?Middle and lower trap strength 4/5 ? ?TODAY'S TREATMENT  ?Manual therapy soft tissue mobilization to bil upper traps and periscapular muscles  ?Trigger Point Dry-Needling  ?Treatment instructions: Expect mild to moderate muscle soreness. S/S of pneumothorax if dry needled over a lung field, and to seek immediate medical attention should they occur. Patient verbalized understanding of these instructions and education. ? ?Patient Consent Given: Yes ?Education handout provided: Yes ?Muscles treated: bil upper traps, left subscapularis ?Electrical stimulation performed: No ?Parameters: N/A ?Treatment response/outcome: improved scapular dissociation and improved soft tissue mobility  ? ? ?PATIENT EDUCATION:  ?Education details: DN aftercare ?Person educated: Patient ?Education method: Explanation and Handouts ?Education comprehension: verbalized understanding ? ? ?HOME EXERCISE PROGRAM: ?Will start next visit  ? ?ASSESSMENT: ? ?CLINICAL IMPRESSION: ?Patient is a 46 y.o. female who was seen today for physical therapy evaluation and treatment for upper back pain, left > right medial scapular pain and neck pain.  She has had symptoms for years but feels it is becoming harder to manage.  The pain is aggravated with documentation at work (in the [employer]), looking down at her phone and with reaching behind her.  Her cervical ROM is limited particularly with left rotation and sidebending.  Decreased thoracic mobility and left scapular dysfunction noted as well.  Tender points in periscapular and cervical muscles.  She would benefit from PT to address these myofascial impairments ? ? ?OBJECTIVE IMPAIRMENTS decreased ROM, decreased strength, increased fascial  restrictions, increased muscle spasms, and pain.  ? ?ACTIVITY LIMITATIONS community activity, driving, and occupation.  ? ?PERSONAL FACTORS Time since onset of injury/illness/exacerbation are also affecting patient's functional outcome.  ? ? ?REHAB POTENTIAL: Good ? ?CLINICAL DECISION MAKING: Stable/uncomplicated ? ?EVALUATION COMPLEXITY: Low ? ? ?GOALS: ?Goals reviewed with patient? Yes ? ?SHORT TERM GOALS: Target date: 05/10/2022 ? ?The patient will demonstrate knowledge of basic self care strategies and exercises to promote healing   ?Baseline: ?Goal status: INITIAL ? ?2.  The patient will report a 50% improvement in pain levels with functional activities which are currently difficult including sitting tasks for documenting at work and looking at her phone ?Baseline:  ?Goal status: INITIAL ? ?3.  The patient will have improved cervical side bending and rotation to at least 40 degrees bilaterally needed for driving ?Baseline:  ?Goal status: INITIAL ? ? ? ?LONG TERM GOALS: Target date: 06/07/2022 ? ?The patient will be independent in a safe self progression of a home exercise program to promote further recovery of function   ?Baseline:  ?Goal status: INITIAL ? ?2.  The patient will report a 75% improvement in pain levels with functional activities which are currently difficult including sustained sitting work tasks ?Baseline:  ?Goal status: INITIAL ? ?3.  Cervical rotation symmetrical and at least 50 degrees needed for driving ?Baseline:  ?Goal status: INITIAL ? ?4.  Improved middle and lower trap strength 4+/5 needed for greater stamina with work duties ?Baseline:  ?Goal status: INITIAL ? ?5.  FOTO score improved from 63% to 69% ?Baseline:  ?Goal status: INITIAL ? ? ?PLAN: ?PT FREQUENCY: 1x/week ? ?PT DURATION: 8 weeks ? ?PLANNED INTERVENTIONS: Therapeutic exercises, Therapeutic activity, Neuromuscular re-education, Balance training, Gait training, Patient/Family education, Joint mobilization, Aquatic Therapy, Dry  Needling, Electrical stimulation, Spinal mobilization, Cryotherapy, Moist heat, Taping, Traction, Ultrasound, Ionotophoresis 4mg /ml Dexamethasone, and Manual therapy. ? ?PLAN FOR NEXT SESSION: assess response to

## 2022-04-12 ENCOUNTER — Encounter: Payer: Self-pay | Admitting: Physical Therapy

## 2022-04-12 ENCOUNTER — Ambulatory Visit: Payer: 59 | Attending: Internal Medicine | Admitting: Physical Therapy

## 2022-04-12 DIAGNOSIS — M6281 Muscle weakness (generalized): Secondary | ICD-10-CM | POA: Diagnosis not present

## 2022-04-12 DIAGNOSIS — M542 Cervicalgia: Secondary | ICD-10-CM | POA: Insufficient documentation

## 2022-04-12 DIAGNOSIS — M546 Pain in thoracic spine: Secondary | ICD-10-CM | POA: Diagnosis not present

## 2022-04-12 DIAGNOSIS — M62838 Other muscle spasm: Secondary | ICD-10-CM | POA: Insufficient documentation

## 2022-04-12 NOTE — Patient Instructions (Signed)

## 2022-04-23 NOTE — Progress Notes (Signed)
46 y.o. G25P2012 Married Caucasian female here for annual exam.   ? ?She wants routine labs, vit D and vit B12. ? ?Thyroid is now stable.  ? ?Anemia is stable.  ? ?Patient states she has had periodic cramping and spotting.  ?No post coital bleeding.  ?No hot flashes.  ? ?PCP:  Hillard Danker, MD ? ?Patient's last menstrual period was 06/22/2011.     ?  ?    ?Sexually active: Yes.    ?The current method of family planning is status post SUPRA-hysterectomy.    ?Exercising: No.   ?Smoker:  no ? ?Health Maintenance: ?Pap:  06-24-18 Neg:Neg HR HPV, 10-15-16 Neg:Neg HR HPV, 04-29-13 Neg:Neg HR HPV ?History of abnormal Pap:  no ?MMG:  09-15-21 Bil.Diag.w/Lt.US/Implants--Neg/BiRads2/screening 82yr. ?Colonoscopy:  10/2019 polyps; next 2025 ?BMD:   n/a  Result  n/a ?TDaP:  UTD thru work ?Gardasil:   no ?HIV: Neg in the past ?Hep C:Neg in the past ?Screening Labs:  Today.  ? ? reports that she has never smoked. She has never used smokeless tobacco. She reports current alcohol use. She reports that she does not use drugs. ? ?Past Medical History:  ?Diagnosis Date  ? Anemia   ? Autoimmune disorder (HCC)   ? Duplication of bilateral ureters 2012  ? Congenital bilateral.  IVP done in 2012.  ? Gastric ulcer   ? History of COVID-19 12/2020  ? Iron deficiency anemia   ? hx iron infusions with Dr. Myna Hidalgo  ? Thyroid disease   ? hypothyroid  ? ? ?Past Surgical History:  ?Procedure Laterality Date  ? AUGMENTATION MAMMAPLASTY  2004  ? BIOPSY  04/21/2019  ? Procedure: BIOPSY;  Surgeon: Willis Modena, MD;  Location: [facility] ENDOSCOPY;  Service: Endoscopy;;  ? BREAST SURGERY    ? augmentation  ? ESOPHAGEAL DILATION    ? Dr. Ewing Schlein.  Procedure done twice in past.   ? ESOPHAGOGASTRODUODENOSCOPY N/A 04/21/2019  ? Procedure: ESOPHAGOGASTRODUODENOSCOPY (EGD);  Surgeon: Willis Modena, MD;  Location: [facility] ENDOSCOPY;  Service: Endoscopy;  Laterality: N/A;  ? EYE SURGERY    ? lasik  ? FOOT FRACTURE SURGERY Left   ? 2020  ? LAPAROSCOPIC SUPRACERVICAL  HYSTERECTOMY  07/10/2011  ? Procedure: LAPAROSCOPIC SUPRACERVICAL HYSTERECTOMY;  Surgeon: Melony Overly;  Location: [facility];  Service: Gynecology;  Laterality: N/A;  Laparoscopic Supracervical Hysterectomy & Cystoscopy  ? PROXIMAL INTERPHALANGEAL FUSION (PIP) Left 01/05/2019  ? Procedure: PROXIMAL INTERPHALANGEAL FUSION (PIP);  Surgeon: Terance Hart, MD;  Location: [facility];  Service: Orthopedics;  Laterality: Left;  ? right knee    ? vestibuectomy  2003  ? Dr Edward Jolly  ? ? ?Current Outpatient Medications  ?Medication Sig Dispense Refill  ? levothyroxine (SYNTHROID) 175 MCG tablet Take 1 tablet (175 mcg total) by mouth daily. 90 tablet 3  ? ?No current facility-administered medications for this visit.  ? ? ?Family History  ?Problem Relation Age of Onset  ? Rheum arthritis Mother   ? Hypertension Mother   ? Migraines Mother   ? Hyperlipidemia Mother   ? Thyroid disease Mother   ? Thyroid disease Father   ? Seizures Sister   ? Hypertension Maternal Grandmother   ? Hyperlipidemia Maternal Grandmother   ? Stroke Maternal Grandmother   ? Hypertension Paternal Grandmother   ? Stroke Paternal Grandmother   ? ? ?Review of Systems  ?All other systems reviewed and are negative. ? ?Exam:   ?BP 118/80 (BP Location: Left Arm, Patient Position: Sitting, Cuff Size: Normal)  Ht 5' 6.5" (1.689 m)   Wt 186 lb (84.4 kg)   LMP 06/22/2011   BMI 29.57 kg/m?     ?General appearance: alert, cooperative and appears stated age ?Head: normocephalic, without obvious abnormality, atraumatic ?Neck: no adenopathy, supple, symmetrical, trachea midline and thyroid normal to inspection and palpation ?Lungs: clear to auscultation bilaterally ?Breasts: bilateral implants, no masses or tenderness, No nipple retraction or dimpling, No nipple discharge or bleeding, No axillary adenopathy ?Heart: regular rate and rhythm ?Abdomen: soft, non-tender; no masses, no organomegaly ?Extremities: extremities normal, atraumatic, no cyanosis  or edema ?Skin: skin color, texture, turgor normal. No rashes or lesions ?Lymph nodes: cervical, supraclavicular, and axillary nodes normal. ?Neurologic: grossly normal ? ?Pelvic: External genitalia:  no lesions ?             No abnormal inguinal nodes palpated. ?             Urethra:  normal appearing urethra with no masses, tenderness or lesions ?             Bartholins and Skenes: normal    ?             Vagina: normal appearing vagina with normal color and discharge, no lesions ?             Cervix: no lesions ?             Pap taken: no ?Bimanual Exam:  Uterus:  absent ?             Adnexa: no mass, fullness, tenderness ?             Rectal exam: yes.  Confirms. ?             Anus:  normal sphincter tone, no lesions ? ?Chaperone was present for exam:  Bari Mantis, CMA ? ?Assessment:   ?Well woman visit with gynecologic exam. ?Status post supracervical hysterectomy.  ?Bilateral breast implants.  ?Hx anemia. ?Hypothyroidism.  Stable on Synthroid. ? ?Plan: ?Mammogram screening discussed. ?Self breast awareness reviewed. ?Pap and HR HPV as above. ?Guidelines for Calcium, Vitamin D, regular exercise program including cardiovascular and weight bearing exercise. ?Routine labs today, vit D, vit B12.   ?Follow up annually and prn.  ? ?After visit summary provided.  ? ? ? ?

## 2022-04-24 ENCOUNTER — Ambulatory Visit: Payer: 59

## 2022-04-24 DIAGNOSIS — M6281 Muscle weakness (generalized): Secondary | ICD-10-CM | POA: Diagnosis not present

## 2022-04-24 DIAGNOSIS — M546 Pain in thoracic spine: Secondary | ICD-10-CM

## 2022-04-24 DIAGNOSIS — M542 Cervicalgia: Secondary | ICD-10-CM | POA: Diagnosis not present

## 2022-04-24 DIAGNOSIS — M62838 Other muscle spasm: Secondary | ICD-10-CM | POA: Diagnosis not present

## 2022-04-24 NOTE — Therapy (Signed)
?OUTPATIENT PHYSICAL THERAPY TREATMENT NOTE ? ? ?Patient Name: Kayla Salazar ?MRN: [id number] ?DOB:[birth year], 46 y.o., female ?Today's Date: 04/24/2022 ? ?PCP: Myrlene Broker, MD ?REFERRING PROVIDER: Myrlene Broker, * ? ?END OF SESSION:  ? PT End of Session - 04/24/22 0831   ? ? Visit Number 2   ? Date for PT Re-Evaluation 06/07/22   ? Authorization Type [facility]   ? PT Start Time 0730   ? PT Stop Time 0802   ? PT Time Calculation (min) 32 min   ? Activity Tolerance Patient tolerated treatment well   ? Behavior During Therapy [facility] for tasks assessed/performed   ? ?  ?  ? ?  ? ? ?Past Medical History:  ?Diagnosis Date  ? Anemia   ? Autoimmune disorder (HCC)   ? Duplication of bilateral ureters 2012  ? Congenital bilateral.  IVP done in 2012.  ? Gastric ulcer   ? History of COVID-19 12/2020  ? Iron deficiency anemia   ? hx iron infusions with Dr. Myna Hidalgo  ? Thyroid disease   ? hypothyroid  ? ?Past Surgical History:  ?Procedure Laterality Date  ? AUGMENTATION MAMMAPLASTY  2004  ? BIOPSY  04/21/2019  ? Procedure: BIOPSY;  Surgeon: Willis Modena, MD;  Location: [facility] ENDOSCOPY;  Service: Endoscopy;;  ? BREAST SURGERY    ? augmentation  ? ESOPHAGEAL DILATION    ? Dr. Ewing Schlein.  Procedure done twice in past.   ? ESOPHAGOGASTRODUODENOSCOPY N/A 04/21/2019  ? Procedure: ESOPHAGOGASTRODUODENOSCOPY (EGD);  Surgeon: Willis Modena, MD;  Location: [facility] ENDOSCOPY;  Service: Endoscopy;  Laterality: N/A;  ? EYE SURGERY    ? lasik  ? FOOT FRACTURE SURGERY Left   ? 2020  ? LAPAROSCOPIC SUPRACERVICAL HYSTERECTOMY  07/10/2011  ? Procedure: LAPAROSCOPIC SUPRACERVICAL HYSTERECTOMY;  Surgeon: Melony Overly;  Location: [facility];  Service: Gynecology;  Laterality: N/A;  Laparoscopic Supracervical Hysterectomy & Cystoscopy  ? PROXIMAL INTERPHALANGEAL FUSION (PIP) Left 01/05/2019  ? Procedure: PROXIMAL INTERPHALANGEAL FUSION (PIP);  Surgeon: Terance Hart, MD;  Location: [facility];  Service: Orthopedics;  Laterality:  Left;  ? right knee    ? vestibuectomy  2003  ? Dr Edward Jolly  ? ?Patient Active Problem List  ? Diagnosis Date Noted  ? Routine general medical examination at a health care facility 08/15/2021  ? Hypothyroidism 05/15/2021  ? Amenorrhea 05/15/2021  ? OVERWEIGHT 07/07/2008  ? GERD 07/07/2008  ? IBS 07/07/2008  ? Upper back pain on left side 07/07/2008  ? SLEEPINESS 07/07/2008  ? ? ?REFERRING DIAG: M54.9 (ICD-10-CM) - Upper back pain on left side ?  ? ?THERAPY DIAG:  ?Pain in thoracic spine ? ?Other muscle spasm ? ?Muscle weakness (generalized) ? ?Cervicalgia ? ?PERTINENT HISTORY: History of TMJ; right elbow pain with yardwork ? ?PRECAUTIONS: none  ? ?SUBJECTIVE: I felt good after needling last session ? ?PAIN:  ?Are you having pain? Yes: NPRS scale: 0/10 ?Pain location: upper traps, neck ?Pain description: tension ?Aggravating factors: it is just tense  ?Relieving factors: stretching  ? ? ?OBJECTIVE: from evaluation  ?  ?DIAGNOSTIC FINDINGS:  ?none ?  ?PATIENT SURVEYS:  ?FOTO 63% ?  ?COGNITION: ?          Overall cognitive status: Within functional limits for tasks assessed               ?           ?SENSATION: ?WFL ?  ?  ?POSTURE:  ?Decreased left scapular mobility and dissociation;  increased scapular elevation on left with arms overhead ?  ?PALPATION: ?Numerous tender points in bil upper traps and suboccipitals  ?  ?CERVICAL ROM:    ?  ?Active  A/PROM  ?04/12/22  ?Flexion WFLS  ?Extension 60  ?Right lateral flexion 40  ?Left lateral flexion 35  ?Right rotation 45  ?Left rotation 30  ? (Blank rows = not tested) ?  ?Decreased thoracic extension and rotation bil;  UE ROM WNLS throughout ?  ?Middle and lower trap strength 4/5 ?  ?TODAY'S TREATMENT  ?Treatment on date: 04/24/22 ?Open book x10 ?Pec stretch 3x20 seconds  ?Red band: ER and horizontal abduction 2x10 ? ?Trigger Point Dry-Needling  ?Treatment instructions: Expect mild to moderate muscle soreness. S/S of pneumothorax if dry needled over a lung field, and to seek  immediate medical attention should they occur. Patient verbalized understanding of these instructions and education. ? ?Patient Consent Given: Yes ?Education handout provided: Previously provided ?Muscles treated: suboccipitals, cervical and thoracic multifidi, upper traps  ?Treatment response/outcome: twitch response  ? ?Skilled palpation and monitoring by PT during dry needling  ? ?  ?04/12/22: Manual therapy soft tissue mobilization to bil upper traps and periscapular muscles  ?Trigger Point Dry-Needling  ?Treatment instructions: Expect mild to moderate muscle soreness. S/S of pneumothorax if dry needled over a lung field, and to seek immediate medical attention should they occur. Patient verbalized understanding of these instructions and education. ?  ?Patient Consent Given: Yes ?Education handout provided: Yes ?Muscles treated: bil upper traps, left subscapularis ?Electrical stimulation performed: No ?Parameters: N/A ?Treatment response/outcome: improved scapular dissociation and improved soft tissue mobility  ?  ?  ?PATIENT EDUCATION:  ?Education details: Access Code: [email or web address] ?Person educated: Patient ?Education method: Explanation and Handouts ?Education comprehension: verbalized understanding ?  ?  ?HOME EXERCISE PROGRAM: ?Access Code: [email or web address] ?URL: [email or web address] ?Date: 04/24/2022 ?Prepared by: Tresa EndoKelly ? ?Exercises ?- Doorway Pec Stretch at 90 Degrees Abduction  - 2 x daily - 7 x weekly - 1 sets - 3 reps - 20 hold ?- Sidelying Open Book Thoracic Lumbar Rotation and Extension  - 2 x daily - 7 x weekly - 1 sets - 10 reps - 1-3 hold ?- Standing Shoulder External Rotation with Resistance  - 2 x daily - 7 x weekly - 2 sets - 10 reps ?- Seated Shoulder Horizontal Abduction with Resistance  - 2 x daily - 7 x weekly - 2 sets - 10 reps ?  ?ASSESSMENT: ?  ?CLINICAL IMPRESSION: ?1st time follow-up after evaluation.  Pt responded well to DN at evaluation and was receptive to try again today.  Pt  with tension in thoracic and cervical multifidi, upper traps and subocciptals and had good response to DN with twitch response.  PT initiated HEP for thoracic flexibility and postural strength.  Pt required minor tactile cues for alignment.  Patient will benefit from skilled PT to address the below impairments and improve overall function.  ?  ?  ?OBJECTIVE IMPAIRMENTS decreased ROM, decreased strength, increased fascial restrictions, increased muscle spasms, and pain.  ?  ?ACTIVITY LIMITATIONS community activity, driving, and occupation.  ?  ?PERSONAL FACTORS Time since onset of injury/illness/exacerbation are also affecting patient's functional outcome.  ?  ?  ?REHAB POTENTIAL: Good ?  ?CLINICAL DECISION MAKING: Stable/uncomplicated ?  ?EVALUATION COMPLEXITY: Low ?  ?  ?GOALS: ?Goals reviewed with patient? Yes ?  ?SHORT TERM GOALS: Target date: 05/10/2022 ?  ?The patient will demonstrate knowledge of basic self care strategies and exercises to promote  healing   ?Baseline: ?Goal status: INITIAL ?  ?2.  The patient will report a 50% improvement in pain levels with functional activities which are currently difficult including sitting tasks for documenting at work and looking at her phone ?Baseline:  ?Goal status: INITIAL ?  ?3.  The patient will have improved cervical side bending and rotation to at least 40 degrees bilaterally needed for driving ?Baseline:  ?Goal status: INITIAL ?  ?  ?  ?LONG TERM GOALS: Target date: 06/07/2022 ?  ?The patient will be independent in a safe self progression of a home exercise program to promote further recovery of function   ?Baseline:  ?Goal status: INITIAL ?  ?2.  The patient will report a 75% improvement in pain levels with functional activities which are currently difficult including sustained sitting work tasks ?Baseline:  ?Goal status: INITIAL ?  ?3.  Cervical rotation symmetrical and at least 50 degrees needed for driving ?Baseline:  ?Goal status: INITIAL ?  ?4.  Improved middle  and lower trap strength 4+/5 needed for greater stamina with work duties ?Baseline:  ?Goal status: INITIAL ?  ?5.  FOTO score improved from 63% to 69% ?Baseline:  ?Goal status: INITIAL ?  ?  ?PLAN: ?PT FREQUE

## 2022-04-30 ENCOUNTER — Other Ambulatory Visit (HOSPITAL_COMMUNITY): Payer: Self-pay

## 2022-05-01 ENCOUNTER — Ambulatory Visit: Payer: 59 | Attending: Internal Medicine

## 2022-05-01 ENCOUNTER — Ambulatory Visit (INDEPENDENT_AMBULATORY_CARE_PROVIDER_SITE_OTHER): Payer: 59 | Admitting: Obstetrics and Gynecology

## 2022-05-01 ENCOUNTER — Encounter: Payer: Self-pay | Admitting: Obstetrics and Gynecology

## 2022-05-01 VITALS — BP 118/80 | Ht 66.5 in | Wt 186.0 lb

## 2022-05-01 DIAGNOSIS — Z0000 Encounter for general adult medical examination without abnormal findings: Secondary | ICD-10-CM

## 2022-05-01 DIAGNOSIS — M546 Pain in thoracic spine: Secondary | ICD-10-CM | POA: Insufficient documentation

## 2022-05-01 DIAGNOSIS — M6281 Muscle weakness (generalized): Secondary | ICD-10-CM | POA: Diagnosis not present

## 2022-05-01 DIAGNOSIS — Z01419 Encounter for gynecological examination (general) (routine) without abnormal findings: Secondary | ICD-10-CM

## 2022-05-01 DIAGNOSIS — M62838 Other muscle spasm: Secondary | ICD-10-CM | POA: Diagnosis not present

## 2022-05-01 DIAGNOSIS — M542 Cervicalgia: Secondary | ICD-10-CM | POA: Diagnosis not present

## 2022-05-01 NOTE — Therapy (Signed)
?OUTPATIENT PHYSICAL THERAPY TREATMENT NOTE ? ? ?Patient Name: Kayla Salazar ?MRN: [id number] ?DOB:[birth year], 46 y.o., female ?Today's Date: 05/01/2022 ? ?PCP: Myrlene Broker, MD ?REFERRING PROVIDER: Myrlene Broker, * ? ?END OF SESSION:  ? PT End of Session - 05/01/22 0801   ? ? Visit Number 3   ? Date for PT Re-Evaluation 06/07/22   ? Authorization Type [facility]   ? PT Start Time 0730   ? PT Stop Time 0803   ? PT Time Calculation (min) 33 min   ? Activity Tolerance Patient tolerated treatment well   ? Behavior During Therapy [facility] for tasks assessed/performed   ? ?  ?  ? ?  ? ? ? ?Past Medical History:  ?Diagnosis Date  ? Anemia   ? Autoimmune disorder (HCC)   ? Duplication of bilateral ureters 2012  ? Congenital bilateral.  IVP done in 2012.  ? Gastric ulcer   ? History of COVID-19 12/2020  ? Iron deficiency anemia   ? hx iron infusions with Dr. Myna Hidalgo  ? Thyroid disease   ? hypothyroid  ? ?Past Surgical History:  ?Procedure Laterality Date  ? AUGMENTATION MAMMAPLASTY  2004  ? BIOPSY  04/21/2019  ? Procedure: BIOPSY;  Surgeon: Willis Modena, MD;  Location: [facility] ENDOSCOPY;  Service: Endoscopy;;  ? BREAST SURGERY    ? augmentation  ? ESOPHAGEAL DILATION    ? Dr. Ewing Schlein.  Procedure done twice in past.   ? ESOPHAGOGASTRODUODENOSCOPY N/A 04/21/2019  ? Procedure: ESOPHAGOGASTRODUODENOSCOPY (EGD);  Surgeon: Willis Modena, MD;  Location: [facility] ENDOSCOPY;  Service: Endoscopy;  Laterality: N/A;  ? EYE SURGERY    ? lasik  ? FOOT FRACTURE SURGERY Left   ? 2020  ? LAPAROSCOPIC SUPRACERVICAL HYSTERECTOMY  07/10/2011  ? Procedure: LAPAROSCOPIC SUPRACERVICAL HYSTERECTOMY;  Surgeon: Melony Overly;  Location: [facility];  Service: Gynecology;  Laterality: N/A;  Laparoscopic Supracervical Hysterectomy & Cystoscopy  ? PROXIMAL INTERPHALANGEAL FUSION (PIP) Left 01/05/2019  ? Procedure: PROXIMAL INTERPHALANGEAL FUSION (PIP);  Surgeon: Terance Hart, MD;  Location: [facility];  Service: Orthopedics;  Laterality:  Left;  ? right knee    ? vestibuectomy  2003  ? Dr Edward Jolly  ? ?Patient Active Problem List  ? Diagnosis Date Noted  ? Routine general medical examination at a health care facility 08/15/2021  ? Hypothyroidism 05/15/2021  ? Amenorrhea 05/15/2021  ? OVERWEIGHT 07/07/2008  ? GERD 07/07/2008  ? IBS 07/07/2008  ? Upper back pain on left side 07/07/2008  ? SLEEPINESS 07/07/2008  ? ? ?REFERRING DIAG: M54.9 (ICD-10-CM) - Upper back pain on left side ?  ? ?THERAPY DIAG:  ?Pain in thoracic spine ? ?Other muscle spasm ? ?Muscle weakness (generalized) ? ?Cervicalgia ? ?PERTINENT HISTORY: History of TMJ; right elbow pain with yardwork ? ?PRECAUTIONS: none  ? ?SUBJECTIVE: I am much more aware of my posture. I haven't done the exercises as much as I should be.  I had some soreness in the base of my skull after needling but not too bad.   ? ?PAIN:  ?Are you having pain? Yes: NPRS scale: 1/10 ?Pain location: upper traps, neck ?Pain description: tension ?Aggravating factors: it is just tense  ?Relieving factors: stretching  ? ? ?OBJECTIVE: from evaluation  ?  ?DIAGNOSTIC FINDINGS:  ?none ?  ?PATIENT SURVEYS:  ?FOTO 63% ?  ?COGNITION: ?          Overall cognitive status: Within functional limits for tasks assessed               ?           ?  SENSATION: ?WFL ?  ?  ?POSTURE:  ?Decreased left scapular mobility and dissociation; increased scapular elevation on left with arms overhead ?  ?PALPATION: ?Numerous tender points in bil upper traps and suboccipitals  ?  ?CERVICAL ROM:    ?  ?Active  A/PROM  ?04/12/22  ?Flexion WFLS  ?Extension 60  ?Right lateral flexion 40  ?Left lateral flexion 35  ?Right rotation 45  ?Left rotation 30  ? (Blank rows = not tested) ?  ?Decreased thoracic extension and rotation bil;  UE ROM WNLS throughout ?  ?Middle and lower trap strength 4/5 ?  ?TODAY'S TREATMENT  ?Treatment on date: 05/01/22 ?Open book x10 ?Pec stretch 3x20 seconds  ?Red band: ER and horizontal abduction 2x10 ? ?Trigger Point Dry-Needling   ?Treatment instructions: Expect mild to moderate muscle soreness. S/S of pneumothorax if dry needled over a lung field, and to seek immediate medical attention should they occur. Patient verbalized understanding of these instructions and education. ? ?Patient Consent Given: Yes ?Education handout provided: Previously provided ?Muscles treated: suboccipitals, cervical and thoracic multifidi, upper traps , suboccipitals ?Treatment response/outcome: twitch response  ? ?Skilled palpation and monitoring by PT during dry needling  ?Treatment on date: 04/24/22 ?Open book x10 ?Pec stretch 3x20 seconds  ?Red band: ER and horizontal abduction 2x10 ? ?Trigger Point Dry-Needling  ?Treatment instructions: Expect mild to moderate muscle soreness. S/S of pneumothorax if dry needled over a lung field, and to seek immediate medical attention should they occur. Patient verbalized understanding of these instructions and education. ? ?Patient Consent Given: Yes ?Education handout provided: Previously provided ?Muscles treated: suboccipitals, cervical and thoracic multifidi, upper traps ?Treatment response/outcome: twitch response  ? ?Skilled palpation and monitoring by PT during dry needling  ? ?  ?04/12/22: Manual therapy soft tissue mobilization to bil upper traps and periscapular muscles  ?Trigger Point Dry-Needling  ?Treatment instructions: Expect mild to moderate muscle soreness. S/S of pneumothorax if dry needled over a lung field, and to seek immediate medical attention should they occur. Patient verbalized understanding of these instructions and education. ?  ?Patient Consent Given: Yes ?Education handout provided: Yes ?Muscles treated: bil upper traps, left subscapularis ?Electrical stimulation performed: No ?Parameters: N/A ?Treatment response/outcome: improved scapular dissociation and improved soft tissue mobility  ?  ?  ?PATIENT EDUCATION:  ?Education details: Access Code: [email or web address] ?Person educated: Patient ?Education  method: Explanation and Handouts ?Education comprehension: verbalized understanding ?  ?  ?HOME EXERCISE PROGRAM: ?Access Code: [email or web address] ?URL: [email or web address] ?Date: 05/01/2022 ?Prepared by: Tresa Endo ? ?Exercises ?- Doorway Pec Stretch at 90 Degrees Abduction  - 2 x daily - 7 x weekly - 1 sets - 3 reps - 20 hold ?- Sidelying Open Book Thoracic Lumbar Rotation and Extension  - 2 x daily - 7 x weekly - 1 sets - 10 reps - 1-3 hold ?- Standing Shoulder External Rotation with Resistance  - 2 x daily - 7 x weekly - 2 sets - 10 reps ?- Seated Shoulder Horizontal Abduction with Resistance  - 2 x daily - 7 x weekly - 2 sets - 10 reps ?- Seated Cervical Flexion AROM  - 3 x daily - 7 x weekly - 1 sets - 3 reps - 20 hold ?- Seated Cervical Sidebending AROM  - 3 x daily - 7 x weekly - 1 sets - 3 reps - 20 hold ?- Seated Cervical Rotation AROM  - 3 x daily - 7 x weekly - 1 sets - 3 reps - 20 hold ?- Seated Correct  Posture  - 1 x daily - 7 x weekly - 3 sets - 10 reps ? ?ASSESSMENT: ?  ?CLINICAL IMPRESSION: ?Pt reports 20% reduction in pain since the start of care. Pt demonstrated all aspects of HEP well today and demonstrated good posture with these.  PT added cervical flexibility to HEP.  Pt with tension in thoracic and cervical multifidi, upper traps and subocciptals and had good response to DN with twitch response.  PT initiated HEP for thoracic flexibility and postural strength.  Pt required minor tactile cues for alignment.  Patient will benefit from skilled PT to address the below impairments and improve overall function.  ?  ?  ?OBJECTIVE IMPAIRMENTS decreased ROM, decreased strength, increased fascial restrictions, increased muscle spasms, and pain.  ?  ?ACTIVITY LIMITATIONS community activity, driving, and occupation.  ?  ?PERSONAL FACTORS Time since onset of injury/illness/exacerbation are also affecting patient's functional outcome.  ?  ?  ?REHAB POTENTIAL: Good ?  ?CLINICAL DECISION MAKING:  Stable/uncomplicated ?  ?EVALUATION COMPLEXITY: Low ?  ?  ?GOALS: ?Goals reviewed with patient? Yes ?  ?SHORT TERM GOALS: Target date: 05/10/2022 ?  ?The patient will demonstrate knowledge of basic self care strategies and exerc

## 2022-05-01 NOTE — Patient Instructions (Signed)

## 2022-05-02 LAB — COMPREHENSIVE METABOLIC PANEL
AG Ratio: 1.6 (calc) (ref 1.0–2.5)
ALT: 15 U/L (ref 6–29)
AST: 18 U/L (ref 10–35)
Albumin: 4.2 g/dL (ref 3.6–5.1)
Alkaline phosphatase (APISO): 63 U/L (ref 31–125)
BUN: 12 mg/dL (ref 7–25)
CO2: 28 mmol/L (ref 20–32)
Calcium: 9.5 mg/dL (ref 8.6–10.2)
Chloride: 103 mmol/L (ref 98–110)
Creat: 0.57 mg/dL (ref 0.50–0.99)
Globulin: 2.7 g/dL (calc) (ref 1.9–3.7)
Glucose, Bld: 85 mg/dL (ref 65–99)
Potassium: 5.1 mmol/L (ref 3.5–5.3)
Sodium: 138 mmol/L (ref 135–146)
Total Bilirubin: 0.4 mg/dL (ref 0.2–1.2)
Total Protein: 6.9 g/dL (ref 6.1–8.1)

## 2022-05-02 LAB — CBC
HCT: 37.2 % (ref 35.0–45.0)
Hemoglobin: 12 g/dL (ref 11.7–15.5)
MCH: 29 pg (ref 27.0–33.0)
MCHC: 32.3 g/dL (ref 32.0–36.0)
MCV: 89.9 fL (ref 80.0–100.0)
MPV: 11.7 fL (ref 7.5–12.5)
Platelets: 271 10*3/uL (ref 140–400)
RBC: 4.14 10*6/uL (ref 3.80–5.10)
RDW: 11.7 % (ref 11.0–15.0)
WBC: 8.9 10*3/uL (ref 3.8–10.8)

## 2022-05-02 LAB — EXTRA SPECIMEN

## 2022-05-02 LAB — VITAMIN D 25 HYDROXY (VIT D DEFICIENCY, FRACTURES): Vit D, 25-Hydroxy: 32 ng/mL (ref 30–100)

## 2022-05-02 LAB — LIPID PANEL
Cholesterol: 203 mg/dL — ABNORMAL HIGH (ref <200)
HDL: 56 mg/dL (ref >=50)
LDL Cholesterol (Calc): 126 mg/dL (calc) — ABNORMAL HIGH
Non-HDL Cholesterol (Calc): 147 mg/dL (calc) — ABNORMAL HIGH (ref <130)
Total CHOL/HDL Ratio: 3.6 (calc) (ref <5.0)
Triglycerides: 99 mg/dL (ref <150)

## 2022-05-02 LAB — VITAMIN B12: Vitamin B-12: 294 pg/mL (ref 200–1100)

## 2022-05-17 ENCOUNTER — Encounter: Payer: 59 | Admitting: Physical Therapy

## 2022-05-21 ENCOUNTER — Ambulatory Visit: Payer: 59

## 2022-05-21 DIAGNOSIS — M546 Pain in thoracic spine: Secondary | ICD-10-CM

## 2022-05-21 DIAGNOSIS — M6281 Muscle weakness (generalized): Secondary | ICD-10-CM | POA: Diagnosis not present

## 2022-05-21 DIAGNOSIS — M62838 Other muscle spasm: Secondary | ICD-10-CM

## 2022-05-21 DIAGNOSIS — M542 Cervicalgia: Secondary | ICD-10-CM

## 2022-05-21 NOTE — Therapy (Signed)
OUTPATIENT PHYSICAL THERAPY TREATMENT NOTE   Patient Name: Kayla Salazar MRN: [id number] DOB:[birth year], 46 y.o., female Today's Date: 05/21/2022  PCP: Hoyt Koch, MD REFERRING PROVIDER: Hoyt Koch, *  END OF SESSION:   PT End of Session - 05/21/22 1700     Visit Number 4    Date for PT Re-Evaluation 06/07/22    Authorization Type [facility]    PT Start Time 3845    PT Stop Time 1700    PT Time Calculation (min) 43 min    Activity Tolerance Patient tolerated treatment well    Behavior During Therapy [facility] for tasks assessed/performed               Past Medical History:  Diagnosis Date   Anemia    Autoimmune disorder ([place])    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  IVP done in 2012.   Gastric ulcer    History of COVID-19 12/2020   Iron deficiency anemia    hx iron infusions with Dr. Marin Olp   Thyroid disease    hypothyroid   Past Surgical History:  Procedure Laterality Date   AUGMENTATION MAMMAPLASTY  2004   BIOPSY  04/21/2019   Procedure: BIOPSY;  Surgeon: Arta Silence, MD;  Location: [facility] ENDOSCOPY;  Service: Endoscopy;;   BREAST SURGERY     augmentation   ESOPHAGEAL DILATION     Dr. Watt Climes.  Procedure done twice in past.    ESOPHAGOGASTRODUODENOSCOPY N/A 04/21/2019   Procedure: ESOPHAGOGASTRODUODENOSCOPY (EGD);  Surgeon: Arta Silence, MD;  Location: [facility] ENDOSCOPY;  Service: Endoscopy;  Laterality: N/A;   EYE SURGERY     lasik   FOOT FRACTURE SURGERY Left    2020   LAPAROSCOPIC SUPRACERVICAL HYSTERECTOMY  07/10/2011   Procedure: LAPAROSCOPIC SUPRACERVICAL HYSTERECTOMY;  Surgeon: Arloa Koh;  Location: [facility];  Service: Gynecology;  Laterality: N/A;  Laparoscopic Supracervical Hysterectomy & Cystoscopy   PROXIMAL INTERPHALANGEAL FUSION (PIP) Left 01/05/2019   Procedure: PROXIMAL INTERPHALANGEAL FUSION (PIP);  Surgeon: Erle Crocker, MD;  Location: [place];  Service: Orthopedics;   Laterality: Left;   right knee     vestibuectomy  2003   Dr Quincy Simmonds   Patient Active Problem List   Diagnosis Date Noted   Routine general medical examination at a health care facility 08/15/2021   Hypothyroidism 05/15/2021   Amenorrhea 05/15/2021   OVERWEIGHT 07/07/2008   GERD 07/07/2008   IBS 07/07/2008   Upper back pain on left side 07/07/2008   SLEEPINESS 07/07/2008    REFERRING DIAG: M54.9 (ICD-10-CM) - Upper back pain on left side    THERAPY DIAG:  Pain in thoracic spine  Other muscle spasm  Muscle weakness (generalized)  Cervicalgia  PERTINENT HISTORY: History of TMJ; right elbow pain with yardwork  PRECAUTIONS: none   SUBJECTIVE: I feel 50% better and the tension is better.  I've not been super consistent in doing my exercises.  PAIN:  Are you having pain? Yes: NPRS scale: 1/10 Pain location: upper traps, neck Pain description: tension Aggravating factors: it is just tense  Relieving factors: stretching    OBJECTIVE: from evaluation    DIAGNOSTIC FINDINGS:  none   PATIENT SURVEYS:  FOTO 63%   COGNITION:           Overall cognitive status: Within functional limits for tasks assessed  SENSATION: WFL     POSTURE:  Decreased left scapular mobility and dissociation; increased scapular elevation on left with arms overhead   PALPATION: Numerous tender points in bil upper traps and suboccipitals    CERVICAL ROM:      Active  A/PROM  04/12/22 A/ROM 05/21/22  Flexion WFLS   Extension 60   Right lateral flexion 40 42  Left lateral flexion 35 54  Right rotation 45 65  Left rotation 30 65   (Blank rows = not tested)   Decreased thoracic extension and rotation bil;  UE ROM WNLS throughout   Middle and lower trap strength 4/5   TODAY'S TREATMENT  Treatment on date: 05/21/22 Arm bike: Level 1x 6 min (3/3) Open book x10 Pec stretch 3x20 seconds  Red band: ER and horizontal abduction 2x10  Trigger Point Dry-Needling   Treatment instructions: Expect mild to moderate muscle soreness. S/S of pneumothorax if dry needled over a lung field, and to seek immediate medical attention should they occur. Patient verbalized understanding of these instructions and education.  Patient Consent Given: Yes Education handout provided: Previously provided Muscles treated: suboccipitals, cervical and thoracic multifidi, upper traps , suboccipitals Treatment response/outcome: twitch response   Skilled palpation and monitoring by PT during dry needling  Treatment on date: 05/01/22 Open book x10 Pec stretch 3x20 seconds  Red band: ER and horizontal abduction 2x10  Trigger Point Dry-Needling  Treatment instructions: Expect mild to moderate muscle soreness. S/S of pneumothorax if dry needled over a lung field, and to seek immediate medical attention should they occur. Patient verbalized understanding of these instructions and education.  Patient Consent Given: Yes Education handout provided: Previously provided Muscles treated: suboccipitals, cervical and thoracic multifidi, upper traps , suboccipitals Treatment response/outcome: twitch response   Skilled palpation and monitoring by PT during dry needling  Treatment on date: 04/24/22 Open book x10 Pec stretch 3x20 seconds  Red band: ER and horizontal abduction 2x10  Trigger Point Dry-Needling  Treatment instructions: Expect mild to moderate muscle soreness. S/S of pneumothorax if dry needled over a lung field, and to seek immediate medical attention should they occur. Patient verbalized understanding of these instructions and education.  Patient Consent Given: Yes Education handout provided: Previously provided Muscles treated: suboccipitals, cervical and thoracic multifidi, upper traps Treatment response/outcome: twitch response   Skilled palpation and monitoring by PT during dry needling       PATIENT EDUCATION:  Education details: Access Code: [email or web address] Person  educated: Patient Education method: Theatre stage manager Education comprehension: verbalized understanding     HOME EXERCISE PROGRAM: Access Code: [email or web address] URL: [email or web address] Date: 05/01/2022 Prepared by: Claiborne Billings  Exercises - Doorway Pec Stretch at 90 Degrees Abduction  - 2 x daily - 7 x weekly - 1 sets - 3 reps - 20 hold - Sidelying Open Book Thoracic Lumbar Rotation and Extension  - 2 x daily - 7 x weekly - 1 sets - 10 reps - 1-3 hold - Standing Shoulder External Rotation with Resistance  - 2 x daily - 7 x weekly - 2 sets - 10 reps - Seated Shoulder Horizontal Abduction with Resistance  - 2 x daily - 7 x weekly - 2 sets - 10 reps - Seated Cervical Flexion AROM  - 3 x daily - 7 x weekly - 1 sets - 3 reps - 20 hold - Seated Cervical Sidebending AROM  - 3 x daily - 7 x weekly - 1 sets - 3 reps - 20 hold - Seated Cervical Rotation  AROM  - 3 x daily - 7 x weekly - 1 sets - 3 reps - 20 hold - Seated Correct Posture  - 1 x daily - 7 x weekly - 3 sets - 10 reps  ASSESSMENT:   CLINICAL IMPRESSION: Pt reports 50% reduction in pain since the start of care. Pt has not been consistent with her HEP and we reviewed this today.  Cervical A/ROM is improved since the start of care. Pt with tension in thoracic and cervical multifidi, upper traps and subocciptals and had good response to DN with twitch response.  Pt required minor  and intermittent tactile cues for alignment.  Patient will benefit from skilled PT to address the below impairments and improve overall function.      OBJECTIVE IMPAIRMENTS decreased ROM, decreased strength, increased fascial restrictions, increased muscle spasms, and pain.    ACTIVITY LIMITATIONS community activity, driving, and occupation.    PERSONAL FACTORS Time since onset of injury/illness/exacerbation are also affecting patient's functional outcome.      REHAB POTENTIAL: Good   CLINICAL DECISION MAKING: Stable/uncomplicated   EVALUATION  COMPLEXITY: Low     GOALS: Goals reviewed with patient? Yes   SHORT TERM GOALS: Target date: 05/10/2022   The patient will demonstrate knowledge of basic self care strategies and exercises to promote healing   Baseline: Goal status: MET   2.  The patient will report a 50% improvement in pain levels with functional activities which are currently difficult including sitting tasks for documenting at work and looking at her phone Baseline:50% (05/21/22) Goal status: MET    3.  The patient will have improved cervical side bending and rotation to at least 40 degrees bilaterally needed for driving Baseline:  Goal status: MET       LONG TERM GOALS: Target date: 06/07/2022   The patient will be independent in a safe self progression of a home exercise program to promote further recovery of function   Baseline:  Goal status: INITIAL   2.  The patient will report a 75% improvement in pain levels with functional activities which are currently difficult including sustained sitting work tasks Baseline: 50% (05/21/22) Goal status: in progress   3.  Cervical rotation symmetrical and at least 50 degrees needed for driving Baseline:  Goal status: INITIAL   4.  Improved middle and lower trap strength 4+/5 needed for greater stamina with work duties Baseline:  Goal status: INITIAL   5.  FOTO score improved from 63% to 69% Baseline:  Goal status: INITIAL     PLAN: PT FREQUENCY: 1x/week   PT DURATION: 8 weeks   PLANNED INTERVENTIONS: Therapeutic exercises, Therapeutic activity, Neuromuscular re-education, Balance training, Gait training, Patient/Family education, Joint mobilization, Aquatic Therapy, Dry Needling, Electrical stimulation, Spinal mobilization, Cryotherapy, Moist heat, Taping, Traction, Ultrasound, Ionotophoresis 51m/ml Dexamethasone, and Manual therapy.   PLAN FOR NEXT SESSION: assess response to DN#4, continue postural strength and cervical/thoracic mobility.  FOTO next    KSigurd Sos PT 05/21/22 5:01 PM     [facility] [address] [place] [address]Phone # [phone] [phone]

## 2022-05-31 ENCOUNTER — Encounter: Payer: 59 | Admitting: Physical Therapy

## 2022-06-07 ENCOUNTER — Encounter: Payer: 59 | Admitting: Physical Therapy

## 2022-06-14 ENCOUNTER — Encounter: Payer: 59 | Admitting: Physical Therapy

## 2022-06-14 ENCOUNTER — Ambulatory Visit: Payer: 59 | Attending: Internal Medicine

## 2022-06-14 DIAGNOSIS — M546 Pain in thoracic spine: Secondary | ICD-10-CM | POA: Diagnosis not present

## 2022-06-14 DIAGNOSIS — M542 Cervicalgia: Secondary | ICD-10-CM | POA: Insufficient documentation

## 2022-06-14 DIAGNOSIS — M62838 Other muscle spasm: Secondary | ICD-10-CM | POA: Insufficient documentation

## 2022-06-14 DIAGNOSIS — M6281 Muscle weakness (generalized): Secondary | ICD-10-CM | POA: Diagnosis not present

## 2022-06-14 NOTE — Therapy (Signed)
OUTPATIENT PHYSICAL THERAPY TREATMENT NOTE   Patient Name: Kayla Salazar MRN: [id number] DOB:07/19/1976, 46 y.o., female Today's Date: 06/14/2022  PCP: Hoyt Koch, MD REFERRING PROVIDER: Hoyt Koch, *  END OF SESSION:   PT End of Session - 06/14/22 0807     Visit Number 5    Date for PT Re-Evaluation 08/09/22    Authorization Type [facility]    PT Start Time 0730    PT Stop Time 0802    PT Time Calculation (min) 32 min    Activity Tolerance Patient tolerated treatment well    Behavior During Therapy [facility] for tasks assessed/performed                Past Medical History:  Diagnosis Date   Anemia    Autoimmune disorder ([place])    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  IVP done in 2012.   Gastric ulcer    History of COVID-19 12/2020   Iron deficiency anemia    hx iron infusions with Dr. Marin Olp   Thyroid disease    hypothyroid   Past Surgical History:  Procedure Laterality Date   AUGMENTATION MAMMAPLASTY  2004   BIOPSY  04/21/2019   Procedure: BIOPSY;  Surgeon: Arta Silence, MD;  Location: [facility] ENDOSCOPY;  Service: Endoscopy;;   BREAST SURGERY     augmentation   ESOPHAGEAL DILATION     Dr. Watt Climes.  Procedure done twice in past.    ESOPHAGOGASTRODUODENOSCOPY N/A 04/21/2019   Procedure: ESOPHAGOGASTRODUODENOSCOPY (EGD);  Surgeon: Arta Silence, MD;  Location: [facility] ENDOSCOPY;  Service: Endoscopy;  Laterality: N/A;   EYE SURGERY     lasik   FOOT FRACTURE SURGERY Left    2020   LAPAROSCOPIC SUPRACERVICAL HYSTERECTOMY  07/10/2011   Procedure: LAPAROSCOPIC SUPRACERVICAL HYSTERECTOMY;  Surgeon: Arloa Koh;  Location: [facility];  Service: Gynecology;  Laterality: N/A;  Laparoscopic Supracervical Hysterectomy & Cystoscopy   PROXIMAL INTERPHALANGEAL FUSION (PIP) Left 01/05/2019   Procedure: PROXIMAL INTERPHALANGEAL FUSION (PIP);  Surgeon: Erle Crocker, MD;  Location: [place];  Service: Orthopedics;   Laterality: Left;   right knee     vestibuectomy  2003   Dr Quincy Simmonds   Patient Active Problem List   Diagnosis Date Noted   Routine general medical examination at a health care facility 08/15/2021   Hypothyroidism 05/15/2021   Amenorrhea 05/15/2021   OVERWEIGHT 07/07/2008   GERD 07/07/2008   IBS 07/07/2008   Upper back pain on left side 07/07/2008   SLEEPINESS 07/07/2008    REFERRING DIAG: M54.9 (ICD-10-CM) - Upper back pain on left side    THERAPY DIAG:  Pain in thoracic spine - Plan: PT plan of care cert/re-cert  Other muscle spasm - Plan: PT plan of care cert/re-cert  Muscle weakness (generalized) - Plan: PT plan of care cert/re-cert  Cervicalgia - Plan: PT plan of care cert/re-cert  PERTINENT HISTORY: History of TMJ; right elbow pain with yardwork  PRECAUTIONS: none   SUBJECTIVE: I am starting to tighten up on the Lt side since I haven't been here in a while.  I am overall better. 75% since the start of care.    PAIN:  Are you having pain? Yes: NPRS scale: 2/10 Pain location: upper traps, neck Pain description: tension Aggravating factors: it is just tense  Relieving factors: stretching    OBJECTIVE: from evaluation    DIAGNOSTIC FINDINGS:  none   PATIENT SURVEYS:  FOTO 63% 06/14/22: FOTO: 64   COGNITION:  Overall cognitive status: Within functional limits for tasks assessed                          SENSATION: WFL     POSTURE:  Decreased left scapular mobility and dissociation; increased scapular elevation on left with arms overhead   PALPATION: Numerous tender points in bil upper traps and suboccipitals    CERVICAL ROM:      Active  A/PROM  04/12/22 A/ROM 05/21/22 A/ROM 06/14/22  Flexion WFLS    Extension 60    Right lateral flexion 40 42 50  Left lateral flexion 35 54 55  Right rotation 45 65 65  Left rotation 30 65 70   (Blank rows = not tested)   Decreased thoracic extension and rotation bil;  UE ROM WNLS throughout   Middle  and lower trap strength 4/5   TODAY'S TREATMENT  Treatment on date:06/14/22 Trigger Point Dry-Needling  Treatment instructions: Expect mild to moderate muscle soreness. S/S of pneumothorax if dry needled over a lung field, and to seek immediate medical attention should they occur. Patient verbalized understanding of these instructions and education.  Patient Consent Given: Yes Education handout provided: Previously provided Muscles treated: suboccipitals, cervical and thoracic multifidi, upper traps , suboccipitals Treatment response/outcome: twitch response   Skilled palpation and monitoring by PT during dry needling  Treatment on date: 05/21/22 Arm bike: Level 1x 6 min (3/3) Open book x10 Pec stretch 3x20 seconds  Red band: ER and horizontal abduction 2x10  Trigger Point Dry-Needling  Treatment instructions: Expect mild to moderate muscle soreness. S/S of pneumothorax if dry needled over a lung field, and to seek immediate medical attention should they occur. Patient verbalized understanding of these instructions and education.  Patient Consent Given: Yes Education handout provided: Previously provided Muscles treated: suboccipitals, cervical and thoracic multifidi, upper traps , suboccipitals Treatment response/outcome: twitch response   Skilled palpation and monitoring by PT during dry needling  Treatment on date: 05/01/22 Open book x10 Pec stretch 3x20 seconds  Red band: ER and horizontal abduction 2x10  Trigger Point Dry-Needling  Treatment instructions: Expect mild to moderate muscle soreness. S/S of pneumothorax if dry needled over a lung field, and to seek immediate medical attention should they occur. Patient verbalized understanding of these instructions and education.  Patient Consent Given: Yes Education handout provided: Previously provided Muscles treated: suboccipitals, cervical and thoracic multifidi, upper traps , suboccipitals Treatment response/outcome: twitch  response   Skilled palpation and monitoring by PT during dry needling      PATIENT EDUCATION:  Education details: Access Code: [email or web address] Person educated: Patient Education method: Theatre stage manager Education comprehension: verbalized understanding     HOME EXERCISE PROGRAM: Access Code: [email or web address] URL: [email or web address] Date: 05/01/2022 Prepared by: Claiborne Billings  Exercises - Doorway Pec Stretch at 90 Degrees Abduction  - 2 x daily - 7 x weekly - 1 sets - 3 reps - 20 hold - Sidelying Open Book Thoracic Lumbar Rotation and Extension  - 2 x daily - 7 x weekly - 1 sets - 10 reps - 1-3 hold - Standing Shoulder External Rotation with Resistance  - 2 x daily - 7 x weekly - 2 sets - 10 reps - Seated Shoulder Horizontal Abduction with Resistance  - 2 x daily - 7 x weekly - 2 sets - 10 reps - Seated Cervical Flexion AROM  - 3 x daily - 7 x weekly - 1 sets - 3 reps - 20 hold -  Seated Cervical Sidebending AROM  - 3 x daily - 7 x weekly - 1 sets - 3 reps - 20 hold - Seated Cervical Rotation AROM  - 3 x daily - 7 x weekly - 1 sets - 3 reps - 20 hold - Seated Correct Posture  - 1 x daily - 7 x weekly - 3 sets - 10 reps  ASSESSMENT:   CLINICAL IMPRESSION: Pt reports 75% reduction in pain since the start of care. Pt has had a lapse in treatment since 05/21/22 and pt continues to report that she isn't complaint with HEP yet is able to get stretches in daily.  Cervical A/ROM is improved again after last visit.  Pt with tension in thoracic and cervical multifidi, upper traps and subocciptals and had good response to DN with twitch response.  Pt required minor  and intermittent tactile cues for alignment.  Patient will benefit from skilled PT to address the below impairments and improve overall function.      OBJECTIVE IMPAIRMENTS decreased ROM, decreased strength, increased fascial restrictions, increased muscle spasms, and pain.    ACTIVITY LIMITATIONS community activity, driving, and  occupation.    PERSONAL FACTORS Time since onset of injury/illness/exacerbation are also affecting patient's functional outcome.      REHAB POTENTIAL: Good   CLINICAL DECISION MAKING: Stable/uncomplicated   EVALUATION COMPLEXITY: Low     GOALS: Goals reviewed with patient? Yes   SHORT TERM GOALS: Target date: 05/10/2022   The patient will demonstrate knowledge of basic self care strategies and exercises to promote healing   Baseline: Goal status: MET   2.  The patient will report a 50% improvement in pain levels with functional activities which are currently difficult including sitting tasks for documenting at work and looking at her phone Baseline:50% (05/21/22) Goal status: MET    3.  The patient will have improved cervical side bending and rotation to at least 40 degrees bilaterally needed for driving Baseline:  Goal status: MET       LONG TERM GOALS: Target date: 06/07/2022   The patient will be independent in a safe self progression of a home exercise program to promote further recovery of function   Baseline:  Goal status: INITIAL   2.  The patient will report a 90% improvement in pain levels with functional activities which are currently difficult including sustained sitting work tasks Baseline: 75% (06/14/22) Goal status: Revised   3.  Cervical rotation symmetrical and at least 80 degrees needed for driving Baseline: 65 and 70 (06/14/22) Goal status: Revised    4.  Improved middle and lower trap strength 4+/5 needed for greater stamina with work duties Baseline:   Goal status: In progress   5.  FOTO score improved from 63% to 69% Baseline: 64 (06/14/22) Goal status: In progress     PLAN: PT FREQUENCY: 1x/week   PT DURATION: 8 weeks   PLANNED INTERVENTIONS: Therapeutic exercises, Therapeutic activity, Neuromuscular re-education, Balance training, Gait training, Patient/Family education, Joint mobilization, Aquatic Therapy, Dry Needling, Electrical  stimulation, Spinal mobilization, Cryotherapy, Moist heat, Taping, Traction, Ultrasound, Ionotophoresis 20m/ml Dexamethasone, and Manual therapy.   PLAN FOR NEXT SESSION: Continue strength, flexibility and manual therapy as needed   KSigurd Sos PT 06/14/22 8:15 AM     [facility] [address] [place] [address]Phone # [phone] [phone]

## 2022-06-21 ENCOUNTER — Encounter: Payer: 59 | Admitting: Physical Therapy

## 2022-06-21 ENCOUNTER — Ambulatory Visit: Payer: 59 | Admitting: Physical Therapy

## 2022-07-05 ENCOUNTER — Ambulatory Visit: Payer: 59

## 2022-07-12 ENCOUNTER — Ambulatory Visit: Payer: 59 | Admitting: Physical Therapy

## 2022-07-19 ENCOUNTER — Ambulatory Visit: Payer: 59 | Attending: Internal Medicine

## 2022-07-19 DIAGNOSIS — M546 Pain in thoracic spine: Secondary | ICD-10-CM | POA: Diagnosis not present

## 2022-07-19 DIAGNOSIS — M62838 Other muscle spasm: Secondary | ICD-10-CM | POA: Insufficient documentation

## 2022-07-19 DIAGNOSIS — M542 Cervicalgia: Secondary | ICD-10-CM | POA: Diagnosis not present

## 2022-07-19 DIAGNOSIS — M6281 Muscle weakness (generalized): Secondary | ICD-10-CM | POA: Diagnosis not present

## 2022-07-19 NOTE — Therapy (Signed)
OUTPATIENT PHYSICAL THERAPY TREATMENT NOTE   Patient Name: Kayla Salazar MRN: [id number] DOB:March 08, 1976, 46 y.o., female Today's Date: 07/19/2022  PCP: Hoyt Koch, MD REFERRING PROVIDER: Hoyt Koch, *  END OF SESSION:   PT End of Session - 07/19/22 0836     Visit Number 6    Date for PT Re-Evaluation 08/09/22    Authorization Type [facility]    PT Start Time 0731    PT Stop Time 0803    PT Time Calculation (min) 32 min    Activity Tolerance Patient tolerated treatment well    Behavior During Therapy [facility] for tasks assessed/performed                 Past Medical History:  Diagnosis Date   Anemia    Autoimmune disorder ([place])    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  IVP done in 2012.   Gastric ulcer    History of COVID-19 12/2020   Iron deficiency anemia    hx iron infusions with Dr. Marin Olp   Thyroid disease    hypothyroid   Past Surgical History:  Procedure Laterality Date   AUGMENTATION MAMMAPLASTY  2004   BIOPSY  04/21/2019   Procedure: BIOPSY;  Surgeon: Arta Silence, MD;  Location[facility] ENDOSCOPY;  Service: Endoscopy;;   BREAST SURGERY     augmentation   ESOPHAGEAL DILATION     Dr. Watt Climes.  Procedure done twice in past.    ESOPHAGOGASTRODUODENOSCOPY N/A 04/21/2019   Procedure: ESOPHAGOGASTRODUODENOSCOPY (EGD);  Surgeon: Arta Silence, MD;  Location: [facility] ENDOSCOPY;  Service: Endoscopy;  Laterality: N/A;   EYE SURGERY     lasik   FOOT FRACTURE SURGERY Left    2020   LAPAROSCOPIC SUPRACERVICAL HYSTERECTOMY  07/10/2011   Procedure: LAPAROSCOPIC SUPRACERVICAL HYSTERECTOMY;  Surgeon: Arloa Koh;  Location: [facility];  Service: Gynecology;  Laterality: N/A;  Laparoscopic Supracervical Hysterectomy & Cystoscopy   PROXIMAL INTERPHALANGEAL FUSION (PIP) Left 01/05/2019   Procedure: PROXIMAL INTERPHALANGEAL FUSION (PIP);  Surgeon: Erle Crocker, MD;  Location: [place];  Service: Orthopedics;   Laterality: Left;   right knee     vestibuectomy  2003   Dr Quincy Simmonds   Patient Active Problem List   Diagnosis Date Noted   Routine general medical examination at a health care facility 08/15/2021   Hypothyroidism 05/15/2021   Amenorrhea 05/15/2021   OVERWEIGHT 07/07/2008   GERD 07/07/2008   IBS 07/07/2008   Upper back pain on left side 07/07/2008   SLEEPINESS 07/07/2008    REFERRING DIAG: M54.9 (ICD-10-CM) - Upper back pain on left side    THERAPY DIAG:  Pain in thoracic spine  Other muscle spasm  Muscle weakness (generalized)  Cervicalgia  PERTINENT HISTORY: History of TMJ; right elbow pain with yardwork  PRECAUTIONS: none   SUBJECTIVE: I have had a little more Lt scapular pain due to riding in the car to and from [place] earlier this month.  I haven't been doing my exercises as much as I should.    PAIN:  Are you having pain? Yes: NPRS scale: 3/10 Pain location: upper traps, neck Pain description: tension Aggravating factors: it is just tense some burning  Relieving factors: stretching    OBJECTIVE: from evaluation    DIAGNOSTIC FINDINGS:  none   PATIENT SURVEYS:  FOTO 63% 06/14/22: FOTO: 64   COGNITION:           Overall cognitive status: Within functional limits for tasks assessed  POSTURE:  Decreased left scapular mobility and dissociation; increased scapular elevation on left with arms overhead   PALPATION: Numerous tender points in bil upper traps and suboccipitals    CERVICAL ROM:      Active  A/PROM  04/12/22 A/ROM 05/21/22 A/ROM 06/14/22  Flexion WFLS    Extension 60    Right lateral flexion 40 42 50  Left lateral flexion 35 54 55  Right rotation 45 65 65  Left rotation 30 65 70   (Blank rows = not tested)   Decreased thoracic extension and rotation bil;  UE ROM WNLS throughout   Middle and lower trap strength 4/5   TODAY'S TREATMENT  Treatment on date:07/19/22 Arm bike: Level 2x 6 minutes (3/3) Cat/cow  x10-tactile cues for thoracic mobility  Childs pose x 2 Rt and LT  Trigger Point Dry-Needling  Treatment instructions: Expect mild to moderate muscle soreness. S/S of pneumothorax if dry needled over a lung field, and to seek immediate medical attention should they occur. Patient verbalized understanding of these instructions and education.  Patient Consent Given: Yes Education handout provided: Previously provided Muscles treated: suboccipitals, cervical and thoracic multifidi, upper traps , suboccipitals Treatment response/outcome: twitch response   Skilled palpation and monitoring by PT during dry needling  Treatment on date:06/14/22 Trigger Point Dry-Needling  Treatment instructions: Expect mild to moderate muscle soreness. S/S of pneumothorax if dry needled over a lung field, and to seek immediate medical attention should they occur. Patient verbalized understanding of these instructions and education.  Patient Consent Given: Yes Education handout provided: Previously provided Muscles treated: suboccipitals, cervical and thoracic multifidi, upper traps , suboccipitals Treatment response/outcome: twitch response   Skilled palpation and monitoring by PT during dry needling  Treatment on date: 05/21/22 Arm bike: Level 1x 6 min (3/3) Open book x10 Pec stretch 3x20 seconds  Red band: ER and horizontal abduction 2x10  Trigger Point Dry-Needling  Treatment instructions: Expect mild to moderate muscle soreness. S/S of pneumothorax if dry needled over a lung field, and to seek immediate medical attention should they occur. Patient verbalized understanding of these instructions and education.  Patient Consent Given: Yes Education handout provided: Previously provided Muscles treated: suboccipitals, cervical and thoracic multifidi, upper traps , suboccipitals Treatment response/outcome: twitch response   Skilled palpation and monitoring by PT during dry needling      PATIENT EDUCATION:   Education details: Access Code: [email or web address] Person educated: Patient Education method: Theatre stage manager Education comprehension: verbalized understanding     HOME EXERCISE PROGRAM: Access Code: [email or web address] URL: [email or web address] Date: 05/01/2022 Prepared by: Claiborne Billings  Exercises - Doorway Pec Stretch at 90 Degrees Abduction  - 2 x daily - 7 x weekly - 1 sets - 3 reps - 20 hold - Sidelying Open Book Thoracic Lumbar Rotation and Extension  - 2 x daily - 7 x weekly - 1 sets - 10 reps - 1-3 hold - Standing Shoulder External Rotation with Resistance  - 2 x daily - 7 x weekly - 2 sets - 10 reps - Seated Shoulder Horizontal Abduction with Resistance  - 2 x daily - 7 x weekly - 2 sets - 10 reps - Seated Cervical Flexion AROM  - 3 x daily - 7 x weekly - 1 sets - 3 reps - 20 hold - Seated Cervical Sidebending AROM  - 3 x daily - 7 x weekly - 1 sets - 3 reps - 20 hold - Seated Cervical Rotation AROM  - 3 x daily - 7 x  weekly - 1 sets - 3 reps - 20 hold - Seated Correct Posture  - 1 x daily - 7 x weekly - 3 sets - 10 reps  ASSESSMENT:   CLINICAL IMPRESSION: Pt reports that she still feels better overall yet has had a set-back after a long drive to [place] 3 weeks ago.  Pt reports moderate compliance with her HEP.  PT added quadruped flexibility to HEP to improve segmental mobility.  Pt with tension in thoracic and cervical multifidi, upper traps and subocciptals and had good response to DN with twitch response.   Patient will benefit from skilled PT to address the below impairments and improve overall function.      OBJECTIVE IMPAIRMENTS decreased ROM, decreased strength, increased fascial restrictions, increased muscle spasms, and pain.    ACTIVITY LIMITATIONS community activity, driving, and occupation.    PERSONAL FACTORS Time since onset of injury/illness/exacerbation are also affecting patient's functional outcome.      REHAB POTENTIAL: Good   CLINICAL DECISION MAKING:  Stable/uncomplicated   EVALUATION COMPLEXITY: Low     GOALS: Goals reviewed with patient? Yes   SHORT TERM GOALS: Target date: 05/10/2022   The patient will demonstrate knowledge of basic self care strategies and exercises to promote healing   Baseline: Goal status: MET   2.  The patient will report a 50% improvement in pain levels with functional activities which are currently difficult including sitting tasks for documenting at work and looking at her phone Baseline:50% (05/21/22) Goal status: MET    3.  The patient will have improved cervical side bending and rotation to at least 40 degrees bilaterally needed for driving Baseline:  Goal status: MET       LONG TERM GOALS: Target date: 06/07/2022   The patient will be independent in a safe self progression of a home exercise program to promote further recovery of function   Baseline:  Goal status: INITIAL   2.  The patient will report a 90% improvement in pain levels with functional activities which are currently difficult including sustained sitting work tasks Baseline: 75% (06/14/22) Goal status: Revised   3.  Cervical rotation symmetrical and at least 80 degrees needed for driving Baseline: 65 and 70 (06/14/22) Goal status: Revised    4.  Improved middle and lower trap strength 4+/5 needed for greater stamina with work duties Baseline:   Goal status: In progress   5.  FOTO score improved from 63% to 69% Baseline: 64 (06/14/22) Goal status: In progress     PLAN: PT FREQUENCY: 1x/week   PT DURATION: 8 weeks   PLANNED INTERVENTIONS: Therapeutic exercises, Therapeutic activity, Neuromuscular re-education, Balance training, Gait training, Patient/Family education, Joint mobilization, Aquatic Therapy, Dry Needling, Electrical stimulation, Spinal mobilization, Cryotherapy, Moist heat, Taping, Traction, Ultrasound, Ionotophoresis 80m/ml Dexamethasone, and Manual therapy.   PLAN FOR NEXT SESSION: Continue strength,  flexibility and manual therapy as needed   KSigurd Sos PT 07/19/22 8:39 AM     [facility] [address] SKiptonGElliott [place] [address]Phone # [phone] [phone]

## 2022-07-24 ENCOUNTER — Other Ambulatory Visit (HOSPITAL_COMMUNITY): Payer: Self-pay

## 2022-07-26 ENCOUNTER — Ambulatory Visit: Payer: 59

## 2022-07-26 DIAGNOSIS — M62838 Other muscle spasm: Secondary | ICD-10-CM | POA: Diagnosis not present

## 2022-07-26 DIAGNOSIS — M542 Cervicalgia: Secondary | ICD-10-CM

## 2022-07-26 DIAGNOSIS — M6281 Muscle weakness (generalized): Secondary | ICD-10-CM

## 2022-07-26 DIAGNOSIS — M546 Pain in thoracic spine: Secondary | ICD-10-CM

## 2022-07-26 NOTE — Therapy (Signed)
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  IVP done in 2012.   Gastric ulcer    History of COVID-19 12/2020   Iron deficiency anemia    hx iron infusions with Dr. Marin Olp   Thyroid disease    hypothyroid   Past Surgical History:  Procedure Laterality Date   AUGMENTATION MAMMAPLASTY  2004   BIOPSY  04/21/2019   Procedure: BIOPSY;  Surgeon: Arta Silence, MD;  Location: [facility] ENDOSCOPY;  Service: Endoscopy;;   BREAST SURGERY     augmentation   ESOPHAGEAL DILATION     Dr. Watt Climes.  Procedure done twice in past.    ESOPHAGOGASTRODUODENOSCOPY N/A 04/21/2019   Procedure: ESOPHAGOGASTRODUODENOSCOPY (EGD);  Surgeon: Arta Silence, MD;  Location: [facility] ENDOSCOPY;  Service: Endoscopy;  Laterality: N/A;   EYE SURGERY     lasik   FOOT FRACTURE SURGERY Left    2020   LAPAROSCOPIC SUPRACERVICAL HYSTERECTOMY  07/10/2011   Procedure: LAPAROSCOPIC SUPRACERVICAL HYSTERECTOMY;  Surgeon: Arloa Koh;  Location: [facility];  Service: Gynecology;  Laterality: N/A;  Laparoscopic Supracervical Hysterectomy & Cystoscopy   PROXIMAL INTERPHALANGEAL FUSION (PIP) Left 01/05/2019   Procedure: PROXIMAL INTERPHALANGEAL FUSION (PIP);  Surgeon: Erle Crocker, MD;  Location: [place];  Service: Orthopedics;  Laterality: Left;   right knee     vestibuectomy  2003   Dr Quincy Simmonds   Patient Active Problem List   Diagnosis Date  Noted   Routine general medical examination at a health care facility 08/15/2021   Hypothyroidism 05/15/2021   Amenorrhea 05/15/2021   OVERWEIGHT 07/07/2008   GERD 07/07/2008   IBS 07/07/2008   Upper back pain on left side 07/07/2008   SLEEPINESS 07/07/2008    REFERRING DIAG: M54.9 (ICD-10-CM) - Upper back pain on left side    THERAPY DIAG:  Pain in thoracic spine  Other muscle spasm  Muscle weakness (generalized)  Cervicalgia  PERTINENT HISTORY: History of TMJ; right elbow pain with yardwork  PRECAUTIONS: none   SUBJECTIVE: I have been doing my exercises more frequently.  I washed walls at my mom's house yesterday and that tightened my muscles.  I stretched and it felt better.    PAIN:  Are you having pain? Yes: NPRS scale: 3/10 Pain location: upper traps, neck Pain description: tension Aggravating factors: it is just tense some burning  Relieving factors: stretching    OBJECTIVE: from evaluation    DIAGNOSTIC FINDINGS:  none   PATIENT SURVEYS:  FOTO 63% 06/14/22: FOTO: 64    COGNITION:           Overall cognitive status: Within functional limits for tasks assessed  POSTURE:  Decreased left scapular mobility and dissociation; increased scapular elevation on left with arms overhead   PALPATION: Numerous tender points in bil upper traps and suboccipitals    CERVICAL ROM:      Active  A/PROM  04/12/22 A/ROM 05/21/22 A/ROM 06/14/22 A/ROM 07/26/22  Flexion WFLS     Extension 60     Right lateral flexion 40 42 50 55  Left lateral flexion 35 54 55 55  Right rotation 45 65 65 65  Left rotation 30 65 70 70   (Blank rows = not tested)   Decreased thoracic extension and rotation bil;  UE ROM WNLS throughout   Middle and lower trap strength 4/5   TODAY'S TREATMENT  Treatment on date:07/26/22 Arm bike: Level 2x 6 minutes (3/3) Cervical A/ROM flexibility: 2x20 seconds each direction Trigger Point Dry-Needling  Treatment  instructions: Expect mild to moderate muscle soreness. S/S of pneumothorax if dry needled over a lung field, and to seek immediate medical attention should they occur. Patient verbalized understanding of these instructions and education.  Patient Consent Given: Yes Education handout provided: Previously provided Muscles treated: suboccipitals, cervical and thoracic multifidi, upper traps , suboccipitals Treatment response/outcome: twitch response   Skilled palpation and monitoring by PT during dry needling  Treatment on date:07/19/22 Arm bike: Level 2x 6 minutes (3/3) Cat/cow x10-tactile cues for thoracic mobility  Childs pose x 2 Rt and LT  Trigger Point Dry-Needling  Treatment instructions: Expect mild to moderate muscle soreness. S/S of pneumothorax if dry needled over a lung field, and to seek immediate medical attention should they occur. Patient verbalized understanding of these instructions and education.  Patient Consent Given: Yes Education handout provided: Previously provided Muscles treated: suboccipitals, cervical and thoracic multifidi, upper traps , suboccipitals Treatment response/outcome: twitch response   Skilled palpation and monitoring by PT during dry needling  Treatment on date:06/14/22 Trigger Point Dry-Needling  Treatment instructions: Expect mild to moderate muscle soreness. S/S of pneumothorax if dry needled over a lung field, and to seek immediate medical attention should they occur. Patient verbalized understanding of these instructions and education.  Patient Consent Given: Yes Education handout provided: Previously provided Muscles treated: suboccipitals, cervical and thoracic multifidi, upper traps , suboccipitals Treatment response/outcome: twitch response   Skilled palpation and monitoring by PT during dry needling      PATIENT EDUCATION:  Education details: Access Code: [email or web address] Person educated: Patient Education method: Geographical information systems officer Education comprehension: verbalized understanding     HOME EXERCISE PROGRAM: Access Code: [email or web address] URL: [email or web address] Date: 05/01/2022 Prepared by: Claiborne Billings  Exercises - Doorway Pec Stretch at 90 Degrees Abduction  - 2 x daily - 7 x weekly - 1 sets - 3 reps - 20 hold - Sidelying Open Book Thoracic Lumbar Rotation and Extension  - 2 x daily - 7 x weekly - 1 sets - 10 reps - 1-3 hold - Standing Shoulder External Rotation with Resistance  - 2 x daily - 7 x weekly - 2 sets - 10 reps - Seated Shoulder Horizontal Abduction with Resistance  - 2 x daily - 7 x weekly - 2 sets - 10 reps - Seated Cervical Flexion AROM  - 3 x daily - 7 x weekly - 1 sets - 3 reps - 20 hold - Seated Cervical Sidebending AROM  - 3 x daily - 7 x weekly - 1 sets - 3 reps - 20 hold - Seated Cervical Rotation AROM  - 3 x daily - 7 x weekly -  1 sets - 3 reps - 20 hold - Seated Correct Posture  - 1 x daily - 7 x weekly - 3 sets - 10 reps  ASSESSMENT:   CLINICAL IMPRESSION:   Pt reports improved compliance with HEP this week.  Pt is performing quadruped flexibility for thoracic spine mobility.  No significant change in cervical flexibility although still improved from evaluation. Pt with tension in thoracic and cervical multifidi, upper traps and subocciptals and had good response to DN with twitch response.   Patient will benefit from skilled PT to address the below impairments and improve overall function.      OBJECTIVE IMPAIRMENTS decreased ROM, decreased strength, increased fascial restrictions, increased muscle spasms, and pain.    ACTIVITY LIMITATIONS community activity, driving, and occupation.    PERSONAL FACTORS Time since onset of injury/illness/exacerbation are also affecting patient's functional outcome.      REHAB POTENTIAL: Good   CLINICAL DECISION MAKING: Stable/uncomplicated   EVALUATION COMPLEXITY: Low     GOALS: Goals reviewed with patient? Yes   SHORT TERM GOALS:  Target date: 05/10/2022   The patient will demonstrate knowledge of basic self care strategies and exercises to promote healing   Baseline: Goal status: MET   2.  The patient will report a 50% improvement in pain levels with functional activities which are currently difficult including sitting tasks for documenting at work and looking at her phone Baseline:50% (05/21/22) Goal status: MET    3.  The patient will have improved cervical side bending and rotation to at least 40 degrees bilaterally needed for driving Baseline:  Goal status: MET       LONG TERM GOALS: Target date: 06/07/2022   The patient will be independent in a safe self progression of a home exercise program to promote further recovery of function   Baseline:  Goal status: INITIAL   2.  The patient will report a 90% improvement in pain levels with functional activities which are currently difficult including sustained sitting work tasks Baseline: 75% (06/14/22) Goal status: Revised   3.  Cervical rotation symmetrical and at least 80 degrees needed for driving Baseline: 65 and 70 (06/14/22) Goal status: Revised    4.  Improved middle and lower trap strength 4+/5 needed for greater stamina with work duties Baseline:   Goal status: In progress   5.  FOTO score improved from 63% to 69% Baseline: 64 (06/14/22) Goal status: In progress     PLAN: PT FREQUENCY: 1x/week   PT DURATION: 8 weeks   PLANNED INTERVENTIONS: Therapeutic exercises, Therapeutic activity, Neuromuscular re-education, Balance training, Gait training, Patient/Family education, Joint mobilization, Aquatic Therapy, Dry Needling, Electrical stimulation, Spinal mobilization, Cryotherapy, Moist heat, Taping, Traction, Ultrasound, Ionotophoresis 27m/ml Dexamethasone, and Manual therapy.   PLAN FOR NEXT SESSION: Continue strength, flexibility and manual therapy as needed. Give FOTO next session.    KSigurd Sos PT 07/26/22 8:29 AM     [facility] [address] [place] [place] [address]Phone # [phone] [phone]

## 2022-08-01 ENCOUNTER — Other Ambulatory Visit (HOSPITAL_COMMUNITY): Payer: Self-pay

## 2022-08-02 ENCOUNTER — Ambulatory Visit: Payer: 59 | Admitting: Physical Therapy

## 2022-08-03 ENCOUNTER — Other Ambulatory Visit: Payer: Self-pay | Admitting: Obstetrics and Gynecology

## 2022-08-03 DIAGNOSIS — Z1231 Encounter for screening mammogram for malignant neoplasm of breast: Secondary | ICD-10-CM

## 2022-08-06 ENCOUNTER — Other Ambulatory Visit (HOSPITAL_COMMUNITY): Payer: Self-pay

## 2022-08-09 ENCOUNTER — Ambulatory Visit: Payer: 59 | Attending: Internal Medicine

## 2022-08-09 DIAGNOSIS — M6281 Muscle weakness (generalized): Secondary | ICD-10-CM | POA: Insufficient documentation

## 2022-08-09 DIAGNOSIS — M546 Pain in thoracic spine: Secondary | ICD-10-CM | POA: Diagnosis not present

## 2022-08-09 DIAGNOSIS — M62838 Other muscle spasm: Secondary | ICD-10-CM | POA: Diagnosis not present

## 2022-08-09 DIAGNOSIS — M542 Cervicalgia: Secondary | ICD-10-CM | POA: Diagnosis not present

## 2022-08-09 NOTE — Therapy (Signed)
OUTPATIENT PHYSICAL THERAPY TREATMENT NOTE   Patient Name: Kayla Salazar MRN: [id number] DOB:1976/10/04, 46 y.o., female Today's Date: 08/09/2022  PCP: Hoyt Koch, MD REFERRING PROVIDER: Hoyt Koch, *  END OF SESSION:   PT End of Session - 08/09/22 0803     Visit Number 8    PT Start Time 0732    PT Stop Time 0804    PT Time Calculation (min) 32 min    Activity Tolerance Patient tolerated treatment well    Behavior During Therapy [facility] for tasks assessed/performed                   Past Medical History:  Diagnosis Date   Anemia    Autoimmune disorder ([place])    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  IVP done in 2012.   Gastric ulcer    History of COVID-19 12/2020   Iron deficiency anemia    hx iron infusions with Dr. Marin Olp   Thyroid disease    hypothyroid   Past Surgical History:  Procedure Laterality Date   AUGMENTATION MAMMAPLASTY  2004   BIOPSY  04/21/2019   Procedure: BIOPSY;  Surgeon: Arta Silence, MD;  Location: [facility] ENDOSCOPY;  Service: Endoscopy;;   BREAST SURGERY     augmentation   ESOPHAGEAL DILATION     Dr. Watt Climes.  Procedure done twice in past.    ESOPHAGOGASTRODUODENOSCOPY N/A 04/21/2019   Procedure: ESOPHAGOGASTRODUODENOSCOPY (EGD);  Surgeon: Arta Silence, MD;  Location: [facility] ENDOSCOPY;  Service: Endoscopy;  Laterality: N/A;   EYE SURGERY     lasik   FOOT FRACTURE SURGERY Left    2020   LAPAROSCOPIC SUPRACERVICAL HYSTERECTOMY  07/10/2011   Procedure: LAPAROSCOPIC SUPRACERVICAL HYSTERECTOMY;  Surgeon: Arloa Koh;  Location: [facility];  Service: Gynecology;  Laterality: N/A;  Laparoscopic Supracervical Hysterectomy & Cystoscopy   PROXIMAL INTERPHALANGEAL FUSION (PIP) Left 01/05/2019   Procedure: PROXIMAL INTERPHALANGEAL FUSION (PIP);  Surgeon: Erle Crocker, MD;  Location: [place];  Service: Orthopedics;  Laterality: Left;   right knee     vestibuectomy  2003   Dr Quincy Simmonds    Patient Active Problem List   Diagnosis Date Noted   Routine general medical examination at a health care facility 08/15/2021   Hypothyroidism 05/15/2021   Amenorrhea 05/15/2021   OVERWEIGHT 07/07/2008   GERD 07/07/2008   IBS 07/07/2008   Upper back pain on left side 07/07/2008   SLEEPINESS 07/07/2008    REFERRING DIAG: M54.9 (ICD-10-CM) - Upper back pain on left side    THERAPY DIAG:  Pain in thoracic spine  Other muscle spasm  Muscle weakness (generalized)  Cervicalgia  PERTINENT HISTORY: History of TMJ; right elbow pain with yardwork  PRECAUTIONS: none   SUBJECTIVE: 80% overall improvement in symptoms since the start of care.  I'm doing my exercises intermittently.   PAIN:  Are you having pain? Yes: NPRS scale: 2/10 Pain location: upper traps, neck Pain description: tension Aggravating factors: it is just tense some burning  Relieving factors: stretching    OBJECTIVE: from evaluation    DIAGNOSTIC FINDINGS:  none   PATIENT SURVEYS:  FOTO 63% 06/14/22: FOTO: 64 08/09/22: 71 (goal met)    COGNITION:           Overall cognitive status: Within functional limits for tasks assessed                           POSTURE:  Decreased left scapular mobility and dissociation; increased scapular elevation on left with arms overhead   PALPATION: Numerous tender points in bil upper traps and suboccipitals    CERVICAL ROM:      Active  A/PROM  04/12/22 A/ROM 05/21/22 A/ROM 06/14/22 A/ROM 07/26/22 A/ROM  08/09/22  Flexion WFLS      Extension 60      Right lateral flexion 40 42 50 55 55  Left lateral flexion 35 54 55 55 50  Right rotation 45 65 65 65 65  Left rotation 30 65 70 70 70   (Blank rows = not tested)   Decreased thoracic extension and rotation bil;  UE ROM WNLS throughout   Middle and lower trap strength 4/5   TODAY'S TREATMENT  Treatment on date:08/09/22  Cervical A/ROM flexibility: 2x20 seconds each direction Trigger Point Dry-Needling   Treatment instructions: Expect mild to moderate muscle soreness. S/S of pneumothorax if dry needled over a lung field, and to seek immediate medical attention should they occur. Patient verbalized understanding of these instructions and education.  Patient Consent Given: Yes Education handout provided: Previously provided Muscles treated: suboccipitals, cervical and thoracic multifidi, upper traps , suboccipitals Treatment response/outcome: twitch response   Skilled palpation and monitoring by PT during dry needling  Treatment on date:07/26/22 Arm bike: Level 2x 6 minutes (3/3) Cervical A/ROM flexibility: 2x20 seconds each direction Trigger Point Dry-Needling  Treatment instructions: Expect mild to moderate muscle soreness. S/S of pneumothorax if dry needled over a lung field, and to seek immediate medical attention should they occur. Patient verbalized understanding of these instructions and education.  Patient Consent Given: Yes Education handout provided: Previously provided Muscles treated: suboccipitals, cervical and thoracic multifidi, upper traps , suboccipitals Treatment response/outcome: twitch response   Skilled palpation and monitoring by PT during dry needling  Treatment on date:07/19/22 Arm bike: Level 2x 6 minutes (3/3) Cat/cow x10-tactile cues for thoracic mobility  Childs pose x 2 Rt and LT  Trigger Point Dry-Needling  Treatment instructions: Expect mild to moderate muscle soreness. S/S of pneumothorax if dry needled over a lung field, and to seek immediate medical attention should they occur. Patient verbalized understanding of these instructions and education.  Patient Consent Given: Yes Education handout provided: Previously provided Muscles treated: suboccipitals, cervical and thoracic multifidi, upper traps , suboccipitals Treatment response/outcome: twitch response   Skilled palpation and monitoring by PT during dry needling    PATIENT EDUCATION:  Education  details: Access Code: [email or web address] Person educated: Patient Education method: Theatre stage manager Education comprehension: verbalized understanding     HOME EXERCISE PROGRAM: Access Code: [email or web address] URL: [email or web address] Date: 05/01/2022 Prepared by: Claiborne Billings  Exercises - Doorway Pec Stretch at 90 Degrees Abduction  - 2 x daily - 7 x weekly - 1 sets - 3 reps - 20 hold - Sidelying Open Book Thoracic Lumbar Rotation and Extension  - 2 x daily - 7 x weekly - 1 sets - 10 reps - 1-3 hold - Standing Shoulder External Rotation with Resistance  - 2 x daily - 7 x weekly - 2 sets - 10 reps - Seated Shoulder Horizontal Abduction with Resistance  - 2 x daily - 7 x weekly - 2 sets - 10 reps - Seated Cervical Flexion AROM  - 3 x daily - 7 x weekly - 1 sets - 3 reps - 20 hold - Seated Cervical Sidebending AROM  - 3 x daily - 7 x weekly - 1 sets - 3 reps - 20 hold - Seated  Cervical Rotation AROM  - 3 x daily - 7 x weekly - 1 sets - 3 reps - 20 hold - Seated Correct Posture  - 1 x daily - 7 x weekly - 3 sets - 10 reps  ASSESSMENT:   CLINICAL IMPRESSION:   Pt reports 80% overall improvement in symptoms since the start of care.  Pt has met FOTO goal and is independent and compliant in HEP.  Pt with trigger points in Lt>Rt neck and scapular region and had good response to HEP.  Cervical A/ROM is functional although limited into rotation.  Pt will continue to perform HEP for continued gains and follow-up with MD as needed.           GOALS: Goals reviewed with patient? Yes   SHORT TERM GOALS: Target date: 05/10/2022   The patient will demonstrate knowledge of basic self care strategies and exercises to promote healing   Baseline: Goal status: MET   2.  The patient will report a 50% improvement in pain levels with functional activities which are currently difficult including sitting tasks for documenting at work and looking at her phone Baseline:50% (05/21/22) Goal status: MET    3.   The patient will have improved cervical side bending and rotation to at least 40 degrees bilaterally needed for driving Baseline:  Goal status: MET       LONG TERM GOALS: Target date: 08/09/22   The patient will be independent in a safe self progression of a home exercise program to promote further recovery of function   Baseline:  Goal status: MET   2.  The patient will report a 90% improvement in pain levels with functional activities which are currently difficult including sustained sitting work tasks Baseline: 80% (08/09/22) Goal status: partially met   3.  Cervical rotation symmetrical and at least 80 degrees needed for driving Baseline:  Goal status: partially met   4.  Improved middle and lower trap strength 4+/5 needed for greater stamina with work duties Baseline:   Goal status: In progress   5.  FOTO score improved from 63% to 69% Baseline: 71 (08/09/22) Goal status: IMET     PLAN: D/C PT to HEP PHYSICAL THERAPY DISCHARGE SUMMARY  Visits from Start of Care: 8  Current functional level related to goals / functional outcomes: See above for current status.  80% overall improvement in symptoms since the start of care.     Remaining deficits: Trigger points in bil neck Lt>Rt and mild limitation in bil cervical rotation.    Education / Equipment: HEP, posutre    Patient agrees to discharge. Patient goals were partially met. Patient is being discharged due to meeting the stated rehab goals.  Sigurd Sos, PT 08/09/22 8:22 AM     [facility] [address] [place], [place] [address] Phone # [phone] Fax [phone]

## 2022-09-21 ENCOUNTER — Other Ambulatory Visit (HOSPITAL_COMMUNITY): Payer: Self-pay

## 2022-09-21 ENCOUNTER — Ambulatory Visit
Admission: RE | Admit: 2022-09-21 | Discharge: 2022-09-21 | Disposition: A | Discharge status: Home / Self Care | Payer: 59 | Source: Ambulatory Visit | Attending: Obstetrics and Gynecology | Admitting: Obstetrics and Gynecology

## 2022-09-21 ENCOUNTER — Other Ambulatory Visit: Payer: Self-pay | Admitting: Obstetrics and Gynecology

## 2022-09-21 DIAGNOSIS — Z1231 Encounter for screening mammogram for malignant neoplasm of breast: Secondary | ICD-10-CM | POA: Diagnosis not present

## 2022-09-24 ENCOUNTER — Other Ambulatory Visit: Payer: Self-pay | Admitting: Obstetrics and Gynecology

## 2022-09-24 DIAGNOSIS — R928 Other abnormal and inconclusive findings on diagnostic imaging of breast: Secondary | ICD-10-CM

## 2022-10-02 ENCOUNTER — Ambulatory Visit
Admission: RE | Admit: 2022-10-02 | Discharge: 2022-10-02 | Disposition: A | Discharge status: Home / Self Care | Payer: 59 | Source: Ambulatory Visit | Attending: Obstetrics and Gynecology | Admitting: Obstetrics and Gynecology

## 2022-10-02 ENCOUNTER — Ambulatory Visit: Payer: 59

## 2022-10-02 ENCOUNTER — Encounter: Payer: Self-pay | Admitting: Obstetrics and Gynecology

## 2022-10-02 ENCOUNTER — Other Ambulatory Visit: Payer: Self-pay | Admitting: Obstetrics and Gynecology

## 2022-10-02 DIAGNOSIS — R928 Other abnormal and inconclusive findings on diagnostic imaging of breast: Secondary | ICD-10-CM

## 2022-10-02 DIAGNOSIS — R9230 Dense breasts, unspecified: Secondary | ICD-10-CM

## 2022-10-02 DIAGNOSIS — R922 Inconclusive mammogram: Secondary | ICD-10-CM | POA: Diagnosis not present

## 2022-10-05 NOTE — Telephone Encounter (Signed)
Pt calling this AM re: this matter and states she also called yesterday and left VM but I cannot find the documentation for that.   Last breast MRI done 02/06/2021-OK to place orders for another? If so, please assist w/ diagnosis for the test.  Please advise. Thanks.

## 2022-10-06 NOTE — Telephone Encounter (Signed)
Please order breast MRI with contrast for my patient.  She has severe density of her breast tissue.

## 2022-10-08 ENCOUNTER — Telehealth: Payer: Self-pay

## 2022-10-08 NOTE — Telephone Encounter (Signed)
Bilateral Breast MRI scheduled 10/25/22 at [facility] (per My Chart message).  Routed to [place] to check on PA.

## 2022-10-25 ENCOUNTER — Ambulatory Visit
Admission: RE | Admit: 2022-10-25 | Discharge: 2022-10-25 | Disposition: A | Discharge status: Home / Self Care | Payer: 59 | Source: Ambulatory Visit | Attending: Obstetrics and Gynecology | Admitting: Obstetrics and Gynecology

## 2022-10-25 DIAGNOSIS — N6489 Other specified disorders of breast: Secondary | ICD-10-CM | POA: Diagnosis not present

## 2022-10-25 DIAGNOSIS — R9230 Dense breasts, unspecified: Secondary | ICD-10-CM

## 2022-10-25 MED ORDER — GADOPICLENOL 0.5 MMOL/ML IV SOLN
10.0000 mL | Freq: Once | INTRAVENOUS | Status: AC | PRN
  Administered 2022-10-25: 10 mL via INTRAVENOUS

## 2022-11-14 ENCOUNTER — Other Ambulatory Visit (HOSPITAL_COMMUNITY): Payer: Self-pay

## 2023-01-07 ENCOUNTER — Other Ambulatory Visit (HOSPITAL_COMMUNITY): Payer: Self-pay

## 2023-01-07 ENCOUNTER — Telehealth: Payer: 59 | Admitting: Physician Assistant

## 2023-01-07 DIAGNOSIS — R062 Wheezing: Secondary | ICD-10-CM

## 2023-01-07 DIAGNOSIS — J111 Influenza due to unidentified influenza virus with other respiratory manifestations: Secondary | ICD-10-CM | POA: Diagnosis not present

## 2023-01-07 MED ORDER — ALBUTEROL SULFATE HFA 108 (90 BASE) MCG/ACT IN AERS
1.0000 | INHALATION_SPRAY | Freq: Four times a day (QID) | RESPIRATORY_TRACT | 0 refills | Status: DC | PRN
  Filled 2023-01-07: qty 6.7, 25d supply, fill #0

## 2023-01-07 MED ORDER — OSELTAMIVIR PHOSPHATE 75 MG PO CAPS
75.0000 mg | ORAL_CAPSULE | Freq: Two times a day (BID) | ORAL | 0 refills | Status: DC
  Filled 2023-01-07: qty 10, 5d supply, fill #0

## 2023-01-07 NOTE — Patient Instructions (Signed)
Dellie Catholic, thank you for joining Margaretann Loveless, PA-C for today's virtual visit.  While this provider is not your primary care provider (PCP), if your PCP is located in our provider database this encounter information will be shared with them immediately following your visit.   A [facility] MyChart account gives you access to today's visit and all your visits, tests, and labs performed at [facility] " click here if you don't have a [facility] MyChart account or go to [email or web address]  Consent: (Patient) Kayla Salazar provided verbal consent for this virtual visit at the beginning of the encounter.  Current Medications:  Current Outpatient Medications:    albuterol (VENTOLIN HFA) 108 (90 Base) MCG/ACT inhaler, Inhale 1-2 puffs into the lungs every 6 (six) hours as needed., Disp: 8 g, Rfl: 0   oseltamivir (TAMIFLU) 75 MG capsule, Take 1 capsule (75 mg total) by mouth 2 (two) times daily., Disp: 10 capsule, Rfl: 0   levothyroxine (SYNTHROID) 175 MCG tablet, Take 1 tablet (175 mcg total) by mouth daily., Disp: 90 tablet, Rfl: 3   Medications ordered in this encounter:  Meds ordered this encounter  Medications   oseltamivir (TAMIFLU) 75 MG capsule    Sig: Take 1 capsule (75 mg total) by mouth 2 (two) times daily.    Dispense:  10 capsule    Refill:  0    Order Specific Question:   Supervising Provider    Answer:   Loreli Dollar   albuterol (VENTOLIN HFA) 108 (90 Base) MCG/ACT inhaler    Sig: Inhale 1-2 puffs into the lungs every 6 (six) hours as needed.    Dispense:  8 g    Refill:  0    Order Specific Question:   Supervising Provider    Answer:   Merrilee Jansky [id number]     *If you need refills on other medications prior to your next appointment, please contact your pharmacy*  Follow-Up: Call back or seek an in-person evaluation if the symptoms worsen or if the condition fails to improve as anticipated.  [facility]  [phone]  Other Instructions  Influenza, Adult Influenza, also called "the flu," is a viral infection that mainly affects the respiratory tract. This includes the lungs, nose, and throat. The flu spreads easily from person to person (is contagious). It causes common cold symptoms, along with high fever and body aches. What are the causes? This condition is caused by the influenza virus. You can get the virus by: Breathing in droplets that are in the air from an infected person's cough or sneeze. Touching something that has the virus on it (has been contaminated) and then touching your mouth, nose, or eyes. What increases the risk? The following factors may make you more likely to get the flu: Not washing or sanitizing your hands often. Having close contact with many people during cold and flu season. Touching your mouth, eyes, or nose without first washing or sanitizing your hands. Not getting an annual flu shot. You may have a higher risk for the flu, including serious problems, such as a lung infection (pneumonia), if you: Are older than 65. Are pregnant. Have a weakened disease-fighting system (immune system). This includes people who have HIV or AIDS, are on chemotherapy, or are taking medicines that reduce (suppress) the immune system. Have a long-term (chronic) illness, such as heart disease, kidney disease, diabetes, or lung disease. Have a liver disorder. Are severely overweight (morbidly obese). Have anemia.  Have asthma. What are the signs or symptoms? Symptoms of this condition usually begin suddenly and last 4-14 days. These may include: Fever and chills. Headaches, body aches, or muscle aches. Sore throat. Cough. Runny or stuffy (congested) nose. Chest discomfort. Poor appetite. Weakness or fatigue. Dizziness. Nausea or vomiting. How is this diagnosed? This condition may be diagnosed based on: Your symptoms and medical history. A physical exam. Swabbing your  nose or throat and testing the fluid for the influenza virus. How is this treated? If the flu is diagnosed early, you can be treated with antiviral medicine that is given by mouth (orally) or through an IV. This can help reduce how severe the illness is and how long it lasts. Taking care of yourself at home can help relieve symptoms. Your health care provider may recommend: Taking over-the-counter medicines. Drinking plenty of fluids. In many cases, the flu goes away on its own. If you have severe symptoms or complications, you may be treated in a hospital. Follow these instructions at home: Activity Rest as needed and get plenty of sleep. Stay home from work or school as told by your health care provider. Unless you are visiting your health care provider, avoid leaving home until your fever has been gone for 24 hours without taking medicine. Eating and drinking Take an oral rehydration solution (ORS). This is a drink that is sold at pharmacies and retail stores. Drink enough fluid to keep your urine pale yellow. Drink clear fluids in small amounts as you are able. Clear fluids include water, ice chips, fruit juice mixed with water, and low-calorie sports drinks. Eat bland, easy-to-digest foods in small amounts as you are able. These foods include bananas, applesauce, rice, lean meats, toast, and crackers. Avoid drinking fluids that contain a lot of sugar or caffeine, such as energy drinks, regular sports drinks, and soda. Avoid alcohol. Avoid spicy or fatty foods. General instructions     Take over-the-counter and prescription medicines only as told by your health care provider. Use a cool mist humidifier to add humidity to the air in your home. This can make it easier to breathe. When using a cool mist humidifier, clean it daily. Empty the water and replace it with clean water. Cover your mouth and nose when you cough or sneeze. Wash your hands with soap and water often and for at least  20 seconds, especially after you cough or sneeze. If soap and water are not available, use alcohol-based hand sanitizer. Keep all follow-up visits. This is important. How is this prevented?  Get an annual flu shot. This is usually available in late summer, fall, or winter. Ask your health care provider when you should get your flu shot. Avoid contact with people who are sick during cold and flu season. This is generally fall and winter. Contact a health care provider if: You develop new symptoms. You have: Chest pain. Diarrhea. A fever. Your cough gets worse. You produce more mucus. You feel nauseous or you vomit. Get help right away if you: Develop shortness of breath or have difficulty breathing. Have skin or nails that turn a bluish color. Have severe pain or stiffness in your neck. Develop a sudden headache or sudden pain in your face or ear. Cannot eat or drink without vomiting. These symptoms may represent a serious problem that is an emergency. Do not wait to see if the symptoms will go away. Get medical help right away. Call your local emergency services (911 in the [place] Do not  drive yourself to the hospital. Summary Influenza, also called "the flu," is a viral infection that primarily affects your respiratory tract. Symptoms of the flu usually begin suddenly and last 4-14 days. Getting an annual flu shot is the best way to prevent getting the flu. Stay home from work or school as told by your health care provider. Unless you are visiting your health care provider, avoid leaving home until your fever has been gone for 24 hours without taking medicine. Keep all follow-up visits. This is important. This information is not intended to replace advice given to you by your health care provider. Make sure you discuss any questions you have with your health care provider. Document Revised: 08/05/2020 Document Reviewed: 08/05/2020 Elsevier Patient Education  [place].    If you have been instructed to have an in-person evaluation today at a local Urgent Care facility, please use the link below. It will take you to a list of all of our available [facility], including address, phone number and hours of operation. Please do not delay care.  [facility]  If you or a family member do not have a primary care provider, use the link below to schedule a visit and establish care. When you choose a [facility] primary care physician or advanced practice provider, you gain a long-term partner in health. Find a Primary Care Provider  Learn more about [facility]'s in-office and virtual care options: [facility]

## 2023-01-07 NOTE — Progress Notes (Signed)
Virtual Visit Consent   Kayla Salazar, you are scheduled for a virtual visit with a [facility] provider today. Just as with appointments in the office, your consent must be obtained to participate. Your consent will be active for this visit and any virtual visit you may have with one of our providers in the next 365 days. If you have a MyChart account, a copy of this consent can be sent to you electronically.  As this is a virtual visit, video technology does not allow for your provider to perform a traditional examination. This may limit your provider's ability to fully assess your condition. If your provider identifies any concerns that need to be evaluated in person or the need to arrange testing (such as labs, EKG, etc.), we will make arrangements to do so. Although advances in technology are sophisticated, we cannot ensure that it will always work on either your end or our end. If the connection with a video visit is poor, the visit may have to be switched to a telephone visit. With either a video or telephone visit, we are not always able to ensure that we have a secure connection.  By engaging in this virtual visit, you consent to the provision of healthcare and authorize for your insurance to be billed (if applicable) for the services provided during this visit. Depending on your insurance coverage, you may receive a charge related to this service.  I need to obtain your verbal consent now. Are you willing to proceed with your visit today? Kayla Salazar has provided verbal consent on 01/07/2023 for a virtual visit (video or telephone). Margaretann Loveless, PA-C  Date: 01/07/2023 11:38 AM  Virtual Visit via Video Note   I, Margaretann Loveless, connected with  Kayla Salazar  ([id number], Jan 02, 1976) on 01/07/23 at 11:30 AM EST by a video-enabled telemedicine application and verified that I am speaking with the correct person using two identifiers.  Location: Patient: Virtual Visit Location  Patient: Home Provider: Virtual Visit Location Provider: Home Office   I discussed the limitations of evaluation and management by telemedicine and the availability of in person appointments. The patient expressed understanding and agreed to proceed.    History of Present Illness: Kayla Salazar is a 47 y.o. who identifies as a female who was assigned female at birth, and is being seen today for flu-like illness.  HPI: Influenza This is a new problem. The current episode started yesterday. The problem occurs constantly. The problem has been gradually worsening. Associated symptoms include chest pain (tightness), chills, congestion, coughing, diaphoresis, fatigue, a fever (101.6), headaches, myalgias and a sore throat. Pertinent negatives include no nausea, vomiting or weakness. Associated symptoms comments: Diarrhea last night, wheezing on expiration (no asthma history). Nothing aggravates the symptoms. Treatments tried: nyquil, advil, mucinex.   Negative at home covid 19 test x 1  Problems:  Patient Active Problem List   Diagnosis Date Noted   Routine general medical examination at a health care facility 08/15/2021   Hypothyroidism 05/15/2021   Amenorrhea 05/15/2021   OVERWEIGHT 07/07/2008   GERD 07/07/2008   IBS 07/07/2008   Upper back pain on left side 07/07/2008   SLEEPINESS 07/07/2008    Allergies:  Allergies  Allergen Reactions   Adhesive [Tape]    Codeine     REACTION: Hives/itching/sweats   Latex Itching   Medications:  Current Outpatient Medications:    albuterol (VENTOLIN HFA) 108 (90 Base) MCG/ACT inhaler, Inhale 1-2 puffs into the lungs every  6 (six) hours as needed., Disp: 8 g, Rfl: 0   oseltamivir (TAMIFLU) 75 MG capsule, Take 1 capsule (75 mg total) by mouth 2 (two) times daily., Disp: 10 capsule, Rfl: 0   levothyroxine (SYNTHROID) 175 MCG tablet, Take 1 tablet (175 mcg total) by mouth daily., Disp: 90 tablet, Rfl: 3  Observations/Objective: Patient is  well-developed, well-nourished in no acute distress.  Resting comfortably at home.  Head is normocephalic, atraumatic.  No labored breathing.  Speech is clear and coherent with logical content.  Patient is alert and oriented at baseline.    Assessment and Plan: 1. Influenza - oseltamivir (TAMIFLU) 75 MG capsule; Take 1 capsule (75 mg total) by mouth 2 (two) times daily.  Dispense: 10 capsule; Refill: 0  2. Wheezing - albuterol (VENTOLIN HFA) 108 (90 Base) MCG/ACT inhaler; Inhale 1-2 puffs into the lungs every 6 (six) hours as needed.  Dispense: 8 g; Refill: 0  - Suspected viral URI with negative covid testing - Possible flu as flu is prevalent in the community at this time - Limited testing availability that would delay appropriate treatment waiting on results - Tamiflu prescribed for possible flu - Albuterol for wheezing - Work note provided - Push fluids - Symptomatic management OTC of choice as needed - Seek in person evaluation if symptoms worsen or fail to improve   Follow Up Instructions: I discussed the assessment and treatment plan with the patient. The patient was provided an opportunity to ask questions and all were answered. The patient agreed with the plan and demonstrated an understanding of the instructions.  A copy of instructions were sent to the patient via MyChart unless otherwise noted below.    The patient was advised to call back or seek an in-person evaluation if the symptoms worsen or if the condition fails to improve as anticipated.  Time:  I spent 10 minutes with the patient via telehealth technology discussing the above problems/concerns.    Mar Daring, PA-C

## 2023-02-21 ENCOUNTER — Other Ambulatory Visit (HOSPITAL_COMMUNITY): Payer: Self-pay

## 2023-02-21 ENCOUNTER — Other Ambulatory Visit: Payer: Self-pay | Admitting: Endocrinology

## 2023-02-22 ENCOUNTER — Other Ambulatory Visit: Payer: Self-pay

## 2023-02-22 MED ORDER — LEVOTHYROXINE SODIUM 175 MCG PO TABS
175.0000 ug | ORAL_TABLET | Freq: Every day | ORAL | 3 refills | Status: DC
  Filled 2023-02-22: qty 90, 90d supply, fill #0
  Filled 2023-05-20: qty 90, 90d supply, fill #1
  Filled 2023-08-19: qty 90, 90d supply, fill #2
  Filled 2023-11-26 – 2023-12-18 (×2): qty 90, 90d supply, fill #3

## 2023-02-23 ENCOUNTER — Other Ambulatory Visit (HOSPITAL_COMMUNITY): Payer: Self-pay

## 2023-03-16 ENCOUNTER — Telehealth: Payer: 59 | Admitting: Family Medicine

## 2023-03-16 DIAGNOSIS — J40 Bronchitis, not specified as acute or chronic: Secondary | ICD-10-CM | POA: Diagnosis not present

## 2023-03-16 MED ORDER — AZITHROMYCIN 250 MG PO TABS: ORAL_TABLET | ORAL | 0 refills | Status: AC

## 2023-03-16 MED ORDER — BENZONATATE 200 MG PO CAPS: 200.0000 mg | ORAL_CAPSULE | Freq: Two times a day (BID) | ORAL | 0 refills | Status: DC | PRN

## 2023-03-16 NOTE — Progress Notes (Signed)

## 2023-04-09 ENCOUNTER — Encounter: Payer: Self-pay | Admitting: Internal Medicine

## 2023-04-11 ENCOUNTER — Other Ambulatory Visit (HOSPITAL_COMMUNITY): Payer: Self-pay

## 2023-04-11 ENCOUNTER — Ambulatory Visit: Payer: 59 | Admitting: Adult Health

## 2023-04-11 ENCOUNTER — Encounter: Payer: Self-pay | Admitting: Adult Health

## 2023-04-11 VITALS — BP 110/60 | HR 65 | Temp 98.1°F | Ht 66.5 in | Wt 202.0 lb

## 2023-04-11 DIAGNOSIS — G44201 Tension-type headache, unspecified, intractable: Secondary | ICD-10-CM | POA: Diagnosis not present

## 2023-04-11 MED ORDER — KETOROLAC TROMETHAMINE 60 MG/2ML IM SOLN
60.0000 mg | Freq: Once | INTRAMUSCULAR | Status: AC
  Administered 2023-04-11: 60 mg via INTRAMUSCULAR

## 2023-04-11 MED ORDER — METHYLPREDNISOLONE ACETATE 80 MG/ML IJ SUSP
80.0000 mg | Freq: Once | INTRAMUSCULAR | Status: AC
  Administered 2023-04-11: 80 mg via INTRAMUSCULAR

## 2023-04-11 MED ORDER — CYCLOBENZAPRINE HCL 10 MG PO TABS
10.0000 mg | ORAL_TABLET | Freq: Every day | ORAL | 0 refills | Status: DC
  Filled 2023-04-11: qty 15, 15d supply, fill #0

## 2023-04-11 NOTE — Progress Notes (Signed)
Subjective:    Patient ID: Kayla Salazar, female    DOB: 1976/06/05, 47 y.o.   MRN: [id number]  Headache    47 year old female who  has a past medical history of Anemia, Autoimmune disorder, Duplication of bilateral ureters (2012), Gastric ulcer, History of COVID-19 (12/2020), Iron deficiency anemia, and Thyroid disease.  She is a patient of Dr. Okey Dupre who I am seeing today for an acute issue. She reports having a headache with pain behind her both  eyes ( worse on left) that radiates along the left side of her head to the base of her skull. Associated symptoms include that of neck and shoulder tension on the left side. She does have tenderness to her scalp when brushing her hair. She has not had any visual disturbances, sinus pressure, ear pain, or  fevers.   At home she has been using Aleve and Goodie powder without relief.   Symptoms have been present for 6 days.   She does have a history of headaches with atmospheric pressure changes that seem to be getting worse as she ages.    Review of Systems  Neurological:  Positive for headaches.   See HPI   Past Medical History:  Diagnosis Date   Anemia    Autoimmune disorder    Duplication of bilateral ureters 2012   Congenital bilateral.  IVP done in 2012.   Gastric ulcer    History of COVID-19 12/2020   Iron deficiency anemia    hx iron infusions with Dr. Myna Hidalgo   Thyroid disease    hypothyroid    Social History   Socioeconomic History   Marital status: Married    Spouse name: Not on file   Number of children: Not on file   Years of education: Not on file   Highest education level: Professional school degree (e.g., MD, DDS, DVM, JD)  Occupational History   Not on file  Tobacco Use   Smoking status: Never   Smokeless tobacco: Never  Vaping Use   Vaping Use: Never used  Substance and Sexual Activity   Alcohol use: Yes   Drug use: No   Sexual activity: Yes    Partners: Male    Birth control/protection:  Surgical, None    Comment: supracervical hyst 2012  Other Topics Concern   Not on file  Social History Narrative   Lives with husband and two daughters   Social Determinants of Health   Financial Resource Strain: Low Risk  (04/11/2023)   Overall Financial Resource Strain (CARDIA)    Difficulty of Paying Living Expenses: Not hard at all  Food Insecurity: No Food Insecurity (04/11/2023)   Hunger Vital Sign    Worried About Running Out of Food in the Last Year: Never true    Ran Out of Food in the Last Year: Never true  Transportation Needs: No Transportation Needs (04/11/2023)   PRAPARE - Administrator, Civil Service (Medical): No    Lack of Transportation (Non-Medical): No  Physical Activity: Unknown (04/10/2023)   Exercise Vital Sign    Days of Exercise per Week: 0 days    Minutes of Exercise per Session: Not on file  Stress: No Stress Concern Present (04/10/2023)   [employer] of Occupational Health - Occupational Stress Questionnaire    Feeling of Stress : Not at all  Social Connections: Socially Integrated (04/10/2023)   Social Connection and Isolation Panel [NHANES]    Frequency of Communication with Friends and Family: Three  times a week    Frequency of Social Gatherings with Friends and Family: Twice a week    Attends Religious Services: More than 4 times per year    Active Member of Clubs or Organizations: Yes    Attends BankerClub or Organization Meetings: More than 4 times per year    Marital Status: Married  Catering managerntimate Partner Violence: Not on file    Past Surgical History:  Procedure Laterality Date   AUGMENTATION MAMMAPLASTY  2004   BIOPSY  04/21/2019   Procedure: BIOPSY;  Surgeon: Willis Modenautlaw, William, MD;  Location: [facility];  Service: Endoscopy;;   BREAST SURGERY     augmentation   ESOPHAGEAL DILATION     Dr. Ewing SchleinMagod.  Procedure done twice in past.    ESOPHAGOGASTRODUODENOSCOPY N/A 04/21/2019   Procedure: ESOPHAGOGASTRODUODENOSCOPY (EGD);  Surgeon:  Willis Modenautlaw, William, MD;  [facility];  Service: Endoscopy;  Laterality: N/A;   EYE SURGERY     lasik   FOOT FRACTURE SURGERY Left    2020   LAPAROSCOPIC SUPRACERVICAL HYSTERECTOMY  07/10/2011   Procedure: LAPAROSCOPIC SUPRACERVICAL HYSTERECTOMY;  Surgeon: Melony OverlyBrook A Silva;  Location: [facility];  Service: Gynecology;  Laterality: N/A;  Laparoscopic Supracervical Hysterectomy & Cystoscopy   PROXIMAL INTERPHALANGEAL FUSION (PIP) Left 01/05/2019   Procedure: PROXIMAL INTERPHALANGEAL FUSION (PIP);  Surgeon: Terance HartAdair, Christopher R, MD;  Location: [facility];  Service: Orthopedics;  Laterality: Left;   right knee     vestibuectomy  2003   Dr Edward JollySilva    Family History  Problem Relation Age of Onset   Rheum arthritis Mother    Hypertension Mother    Migraines Mother    Hyperlipidemia Mother    Thyroid disease Mother    Thyroid disease Father    Seizures Sister    Hypertension Maternal Grandmother    Hyperlipidemia Maternal Grandmother    Stroke Maternal Grandmother    Hypertension Paternal Grandmother    Stroke Paternal Grandmother     Allergies  Allergen Reactions   Adhesive [Tape]    Codeine     REACTION: Hives/itching/sweats   Latex Itching    Current Outpatient Medications on File Prior to Visit  Medication Sig Dispense Refill   levothyroxine (SYNTHROID) 175 MCG tablet Take 1 tablet (175 mcg total) by mouth daily. 90 tablet 3   No current facility-administered medications on file prior to visit.    BP 110/60   Pulse 65   Temp 98.1 F (36.7 C) (Oral)   Ht 5' 6.5" (1.689 m)   Wt 202 lb (91.6 kg)   LMP 06/22/2011   SpO2 98%   BMI 32.12 kg/m       Objective:   Physical Exam Vitals and nursing note reviewed.  Constitutional:      Appearance: She is well-developed.  HENT:     Right Ear: Tympanic membrane, ear canal and external ear normal. There is no impacted cerumen.     Left Ear: Tympanic membrane, ear canal and external ear normal. There is no  impacted cerumen.     Nose: Nose normal. No congestion or rhinorrhea.  Cardiovascular:     Pulses: Normal pulses.     Heart sounds: Normal heart sounds.  Pulmonary:     Effort: Pulmonary effort is normal.     Breath sounds: Normal breath sounds.  Musculoskeletal:        General: Tenderness (throughout left trapezius muscle) present. Normal range of motion.  Skin:    General: Skin is warm and dry.  Neurological:  General: No focal deficit present.     Mental Status: She is alert and oriented to person, place, and time.  Psychiatric:        Mood and Affect: Mood normal.        Behavior: Behavior normal.        Thought Content: Thought content normal.        Judgment: Judgment normal.       Assessment & Plan:  1. Acute intractable tension-type headache - Tension vs migraine headache. Will give Depo Medrol injection and Toradol in the office today. Provide Flexeril for muscle tension. She does know that this medication can cause drowsiness. Will r/o temporal arteritis due to scalp pain with CRP and Sed Rate  - C-reactive Protein - Sedimentation Rate - cyclobenzaprine (FLEXERIL) 10 MG tablet; Take 1 tablet (10 mg total) by mouth at bedtime.  Dispense: 15 tablet; Refill: 0 - methylPREDNISolone acetate (DEPO-MEDROL) injection 80 mg - ketorolac (TORADOL) injection 60 mg  Shirline Frees, NP

## 2023-04-11 NOTE — Patient Instructions (Signed)
It was great meeting you today and I am sorry you have had this headache for so long.   We gave you a steroid injection in the office today as well as a injection of Toradol to help abort the headache. I have sent in a muscle relaxer to take at night time to help alleviate the muscle tension   I am going to do some blood work on you today and will follow up with you once we get your results back

## 2023-04-12 LAB — SEDIMENTATION RATE: Sed Rate: 17 mm/hr (ref 0–20)

## 2023-04-12 LAB — C-REACTIVE PROTEIN: CRP: <1 mg/dL (ref 0.5–20.0)

## 2023-04-22 ENCOUNTER — Other Ambulatory Visit (HOSPITAL_COMMUNITY): Payer: Self-pay

## 2023-04-22 DIAGNOSIS — M7711 Lateral epicondylitis, right elbow: Secondary | ICD-10-CM | POA: Diagnosis not present

## 2023-04-22 MED ORDER — MELOXICAM 15 MG PO TABS
15.0000 mg | ORAL_TABLET | Freq: Every day | ORAL | 1 refills | Status: DC
  Filled 2023-04-22: qty 30, 30d supply, fill #0
  Filled 2023-05-20: qty 30, 30d supply, fill #1

## 2023-05-07 ENCOUNTER — Telehealth: Payer: 59 | Admitting: Physician Assistant

## 2023-05-07 DIAGNOSIS — B3731 Acute candidiasis of vulva and vagina: Secondary | ICD-10-CM | POA: Diagnosis not present

## 2023-05-07 MED ORDER — FLUCONAZOLE 150 MG PO TABS: 150.0000 mg | ORAL_TABLET | Freq: Every day | ORAL | 0 refills | Status: AC

## 2023-05-07 NOTE — Progress Notes (Signed)

## 2023-05-21 ENCOUNTER — Other Ambulatory Visit (HOSPITAL_COMMUNITY): Payer: Self-pay

## 2023-06-05 ENCOUNTER — Ambulatory Visit (INDEPENDENT_AMBULATORY_CARE_PROVIDER_SITE_OTHER): Payer: 59 | Admitting: Internal Medicine

## 2023-06-05 ENCOUNTER — Encounter: Payer: Self-pay | Admitting: Internal Medicine

## 2023-06-05 VITALS — BP 90/60 | HR 62 | Temp 98.6°F | Ht 66.5 in | Wt 198.0 lb

## 2023-06-05 DIAGNOSIS — K582 Mixed irritable bowel syndrome: Secondary | ICD-10-CM

## 2023-06-05 DIAGNOSIS — Z13220 Encounter for screening for lipoid disorders: Secondary | ICD-10-CM

## 2023-06-05 DIAGNOSIS — N912 Amenorrhea, unspecified: Secondary | ICD-10-CM | POA: Diagnosis not present

## 2023-06-05 DIAGNOSIS — Z0000 Encounter for general adult medical examination without abnormal findings: Secondary | ICD-10-CM

## 2023-06-05 DIAGNOSIS — K219 Gastro-esophageal reflux disease without esophagitis: Secondary | ICD-10-CM | POA: Diagnosis not present

## 2023-06-05 DIAGNOSIS — E039 Hypothyroidism, unspecified: Secondary | ICD-10-CM

## 2023-06-05 LAB — COMPREHENSIVE METABOLIC PANEL
ALT: 11 U/L (ref 0–35)
AST: 15 U/L (ref 0–37)
Albumin: 3.9 g/dL (ref 3.5–5.2)
Alkaline Phosphatase: 62 U/L (ref 39–117)
BUN: 11 mg/dL (ref 6–23)
CO2: 23 mEq/L (ref 19–32)
Calcium: 9 mg/dL (ref 8.4–10.5)
Chloride: 106 mEq/L (ref 96–112)
Creatinine, Ser: 0.66 mg/dL (ref 0.40–1.20)
GFR: 104.15 mL/min (ref >60.00)
Glucose, Bld: 90 mg/dL (ref 70–99)
Potassium: 4.7 mEq/L (ref 3.5–5.1)
Sodium: 142 mEq/L (ref 135–145)
Total Bilirubin: 0.3 mg/dL (ref 0.2–1.2)
Total Protein: 7 g/dL (ref 6.0–8.3)

## 2023-06-05 LAB — LIPID PANEL
Cholesterol: 145 mg/dL (ref 0–200)
HDL: 46.6 mg/dL (ref >39.00)
LDL Cholesterol: 79 mg/dL (ref 0–99)
NonHDL: 98.7
Total CHOL/HDL Ratio: 3
Triglycerides: 98 mg/dL (ref 0.0–149.0)
VLDL: 19.6 mg/dL (ref 0.0–40.0)

## 2023-06-05 LAB — CBC
HCT: 37.1 % (ref 36.0–46.0)
Hemoglobin: 12 g/dL (ref 12.0–15.0)
MCHC: 32.4 g/dL (ref 30.0–36.0)
MCV: 88.1 fl (ref 78.0–100.0)
Platelets: 236 10*3/uL (ref 150.0–400.0)
RBC: 4.21 Mil/uL (ref 3.87–5.11)
RDW: 13.6 % (ref 11.5–15.5)
WBC: 7.1 10*3/uL (ref 4.0–10.5)

## 2023-06-05 LAB — TSH: TSH: 0.15 u[IU]/mL — ABNORMAL LOW (ref 0.35–5.50)

## 2023-06-05 LAB — T4, FREE: Free T4: 1.36 ng/dL (ref 0.60–1.60)

## 2023-06-05 NOTE — Progress Notes (Unsigned)
   Subjective:   Patient ID: Kayla Salazar, female    DOB: 08-02-1976, 47 y.o.   MRN: [id number]  HPI The patient is here for physical.  PMH, [facility], social history reviewed and updated  Review of Systems  Objective:  Physical Exam  Vitals:   06/05/23 0806  BP: 90/60  Pulse: 62  Temp: 98.6 F (37 C)  TempSrc: Oral  SpO2: 97%  Weight: 198 lb (89.8 kg)  Height: 5' 6.5" (1.689 m)    Assessment & Plan:

## 2023-06-05 NOTE — Patient Instructions (Signed)
We will check the labs today and get the calcium score done.

## 2023-06-06 NOTE — Assessment & Plan Note (Signed)
She is asking about menopause and s/p hysterectomy we counseled that labs are typically not indicated and average menopause around 45-55. We discussed menopause symptoms and when treatment for those symptoms is indicated.

## 2023-06-06 NOTE — Assessment & Plan Note (Signed)
Flu shot yearly. Tetanus up to date. Colonoscopy up to date. Mammogram up to date, pap smear up to date. Counseled about sun safety and mole surveillance. Counseled about the dangers of distracted driving. Given 10 year screening recommendations.   

## 2023-06-06 NOTE — Assessment & Plan Note (Signed)
Doing well off meds and prior stricture. She knows signs to monitor for and will continue to watch.

## 2023-06-06 NOTE — Assessment & Plan Note (Signed)
Checking TSH and free T4 and adjust synthroid 175 mcg daily as needed.  

## 2023-06-06 NOTE — Assessment & Plan Note (Signed)
Overall stable, up to date on colonoscopy.

## 2023-06-19 ENCOUNTER — Encounter: Payer: Self-pay | Admitting: Internal Medicine

## 2023-06-24 ENCOUNTER — Encounter (HOSPITAL_COMMUNITY): Payer: Self-pay

## 2023-06-24 ENCOUNTER — Ambulatory Visit (HOSPITAL_COMMUNITY)
Admission: RE | Admit: 2023-06-24 | Discharge: 2023-06-24 | Disposition: A | Discharge status: Home / Self Care | Payer: 59 | Source: Ambulatory Visit | Attending: Internal Medicine | Admitting: Internal Medicine

## 2023-06-24 DIAGNOSIS — Z0000 Encounter for general adult medical examination without abnormal findings: Secondary | ICD-10-CM | POA: Insufficient documentation

## 2023-06-25 ENCOUNTER — Encounter: Payer: Self-pay | Admitting: Internal Medicine

## 2023-08-08 ENCOUNTER — Other Ambulatory Visit: Payer: Self-pay | Admitting: Oncology

## 2023-08-08 ENCOUNTER — Other Ambulatory Visit (HOSPITAL_COMMUNITY)
Admission: RE | Admit: 2023-08-08 | Discharge: 2023-08-08 | Disposition: A | Discharge status: Home / Self Care | Payer: 59 | Source: Ambulatory Visit | Attending: Oncology | Admitting: Oncology

## 2023-08-08 DIAGNOSIS — Z006 Encounter for examination for normal comparison and control in clinical research program: Secondary | ICD-10-CM | POA: Insufficient documentation

## 2023-08-08 DIAGNOSIS — Z139 Encounter for screening, unspecified: Secondary | ICD-10-CM

## 2023-08-19 ENCOUNTER — Other Ambulatory Visit: Payer: Self-pay

## 2023-08-19 ENCOUNTER — Other Ambulatory Visit (HOSPITAL_COMMUNITY): Payer: Self-pay

## 2023-08-19 ENCOUNTER — Other Ambulatory Visit: Payer: Self-pay | Admitting: Adult Health

## 2023-08-19 DIAGNOSIS — G44201 Tension-type headache, unspecified, intractable: Secondary | ICD-10-CM

## 2023-08-20 ENCOUNTER — Other Ambulatory Visit (HOSPITAL_COMMUNITY): Payer: Self-pay

## 2023-08-20 MED ORDER — MELOXICAM 15 MG PO TABS
15.0000 mg | ORAL_TABLET | Freq: Every day | ORAL | 0 refills | Status: DC
  Filled 2023-08-20: qty 30, 30d supply, fill #0

## 2023-08-20 MED ORDER — CYCLOBENZAPRINE HCL 10 MG PO TABS
10.0000 mg | ORAL_TABLET | Freq: Every day | ORAL | 0 refills | Status: DC
  Filled 2023-08-20: qty 15, 15d supply, fill #0

## 2023-08-21 ENCOUNTER — Other Ambulatory Visit (HOSPITAL_COMMUNITY): Payer: Self-pay

## 2023-08-23 ENCOUNTER — Other Ambulatory Visit (HOSPITAL_COMMUNITY): Payer: Self-pay

## 2023-08-31 LAB — GENECONNECT MOLECULAR SCREEN: Genetic Analysis Overall Interpretation: NEGATIVE

## 2023-09-05 ENCOUNTER — Other Ambulatory Visit: Payer: Self-pay | Admitting: Obstetrics and Gynecology

## 2023-09-05 DIAGNOSIS — Z1231 Encounter for screening mammogram for malignant neoplasm of breast: Secondary | ICD-10-CM

## 2023-10-01 ENCOUNTER — Ambulatory Visit: Payer: 59

## 2023-10-10 DIAGNOSIS — M7711 Lateral epicondylitis, right elbow: Secondary | ICD-10-CM | POA: Diagnosis not present

## 2023-10-18 ENCOUNTER — Ambulatory Visit
Admission: RE | Admit: 2023-10-18 | Discharge: 2023-10-18 | Disposition: A | Discharge status: Home / Self Care | Payer: 59 | Source: Ambulatory Visit | Attending: Obstetrics and Gynecology | Admitting: Obstetrics and Gynecology

## 2023-10-18 DIAGNOSIS — Z1231 Encounter for screening mammogram for malignant neoplasm of breast: Secondary | ICD-10-CM

## 2023-10-31 DIAGNOSIS — H5203 Hypermetropia, bilateral: Secondary | ICD-10-CM | POA: Diagnosis not present

## 2023-10-31 DIAGNOSIS — H52223 Regular astigmatism, bilateral: Secondary | ICD-10-CM | POA: Diagnosis not present

## 2023-10-31 DIAGNOSIS — H524 Presbyopia: Secondary | ICD-10-CM | POA: Diagnosis not present

## 2023-11-26 ENCOUNTER — Other Ambulatory Visit (HOSPITAL_COMMUNITY): Payer: Self-pay

## 2023-11-26 ENCOUNTER — Other Ambulatory Visit: Payer: Self-pay

## 2023-12-09 ENCOUNTER — Other Ambulatory Visit (HOSPITAL_COMMUNITY): Payer: Self-pay

## 2023-12-18 ENCOUNTER — Other Ambulatory Visit (HOSPITAL_COMMUNITY): Payer: Self-pay

## 2024-01-13 ENCOUNTER — Other Ambulatory Visit (HOSPITAL_COMMUNITY): Payer: Self-pay

## 2024-01-13 ENCOUNTER — Encounter: Payer: Self-pay | Admitting: Obstetrics and Gynecology

## 2024-01-13 MED ORDER — MELOXICAM 15 MG PO TABS
15.0000 mg | ORAL_TABLET | Freq: Every day | ORAL | 1 refills | Status: DC
  Filled 2024-01-13: qty 30, 30d supply, fill #0
  Filled 2024-02-25: qty 30, 30d supply, fill #1

## 2024-01-15 ENCOUNTER — Other Ambulatory Visit: Payer: Self-pay | Admitting: Family Medicine

## 2024-01-15 DIAGNOSIS — M25521 Pain in right elbow: Secondary | ICD-10-CM

## 2024-01-19 ENCOUNTER — Ambulatory Visit
Admission: RE | Admit: 2024-01-19 | Discharge: 2024-01-19 | Disposition: A | Discharge status: Home / Self Care | Payer: 59 | Source: Ambulatory Visit | Attending: Family Medicine | Admitting: Family Medicine

## 2024-01-19 DIAGNOSIS — M67823 Other specified disorders of tendon, right elbow: Secondary | ICD-10-CM | POA: Diagnosis not present

## 2024-01-19 DIAGNOSIS — M25521 Pain in right elbow: Secondary | ICD-10-CM | POA: Diagnosis not present

## 2024-01-24 DIAGNOSIS — M7711 Lateral epicondylitis, right elbow: Secondary | ICD-10-CM | POA: Diagnosis not present

## 2024-01-27 ENCOUNTER — Other Ambulatory Visit (HOSPITAL_COMMUNITY): Payer: Self-pay

## 2024-01-27 MED ORDER — NITROGLYCERIN 0.2 MG/HR TD PT24
MEDICATED_PATCH | TRANSDERMAL | 0 refills | Status: DC
  Filled 2024-01-27: qty 30, 60d supply, fill #0

## 2024-02-11 DIAGNOSIS — M7711 Lateral epicondylitis, right elbow: Secondary | ICD-10-CM | POA: Diagnosis not present

## 2024-02-11 DIAGNOSIS — M6281 Muscle weakness (generalized): Secondary | ICD-10-CM | POA: Diagnosis not present

## 2024-02-18 DIAGNOSIS — M6281 Muscle weakness (generalized): Secondary | ICD-10-CM | POA: Diagnosis not present

## 2024-02-18 DIAGNOSIS — M7711 Lateral epicondylitis, right elbow: Secondary | ICD-10-CM | POA: Diagnosis not present

## 2024-02-25 ENCOUNTER — Other Ambulatory Visit (HOSPITAL_COMMUNITY): Payer: Self-pay

## 2024-03-03 DIAGNOSIS — M6281 Muscle weakness (generalized): Secondary | ICD-10-CM | POA: Diagnosis not present

## 2024-03-03 DIAGNOSIS — M7711 Lateral epicondylitis, right elbow: Secondary | ICD-10-CM | POA: Diagnosis not present

## 2024-03-13 ENCOUNTER — Telehealth: Admitting: Family Medicine

## 2024-03-13 DIAGNOSIS — J069 Acute upper respiratory infection, unspecified: Secondary | ICD-10-CM

## 2024-03-13 MED ORDER — AZITHROMYCIN 250 MG PO TABS: ORAL_TABLET | ORAL | 0 refills | Status: AC

## 2024-03-13 MED ORDER — BENZONATATE 200 MG PO CAPS: 200.0000 mg | ORAL_CAPSULE | Freq: Two times a day (BID) | ORAL | 0 refills | Status: DC | PRN

## 2024-03-13 NOTE — Progress Notes (Signed)
 E-Visit for Cough   We are sorry that you are not feeling well.  Here is how we plan to help!  Based on your presentation I believe you most likely have A cough due to bacteria.  When patients have a fever and a productive cough with a change in color or increased sputum production, we are concerned about bacterial bronchitis.  If left untreated it can progress to pneumonia.  If your symptoms do not improve with your treatment plan it is important that you contact your provider.   I have prescribed Azithromyin 250 mg: two tablets now and then one tablet daily for 4 additonal days    In addition you may use A prescription cough medication called Tessalon Perles 100mg . You may take 1-2 capsules every 8 hours as needed for your cough.    From your responses in the eVisit questionnaire you describe inflammation in the upper respiratory tract which is causing a significant cough.  This is commonly called Bronchitis and has four common causes:   Allergies Viral Infections Acid Reflux Bacterial Infection Allergies, viruses and acid reflux are treated by controlling symptoms or eliminating the cause. An example might be a cough caused by taking certain blood pressure medications. You stop the cough by changing the medication. Another example might be a cough caused by acid reflux. Controlling the reflux helps control the cough.  USE OF BRONCHODILATOR ("RESCUE") INHALERS: There is a risk from using your bronchodilator too frequently.  The risk is that over-reliance on a medication which only relaxes the muscles surrounding the breathing tubes can reduce the effectiveness of medications prescribed to reduce swelling and congestion of the tubes themselves.  Although you feel brief relief from the bronchodilator inhaler, your asthma may actually be worsening with the tubes becoming more swollen and filled with mucus.  This can delay other crucial treatments, such as oral steroid medications. If you need to  use a bronchodilator inhaler daily, several times per day, you should discuss this with your provider.  There are probably better treatments that could be used to keep your asthma under control.     HOME CARE Only take medications as instructed by your medical team. Complete the entire course of an antibiotic. Drink plenty of fluids and get plenty of rest. Avoid close contacts especially the very young and the elderly Cover your mouth if you cough or cough into your sleeve. Always remember to wash your hands A steam or ultrasonic humidifier can help congestion.   GET HELP RIGHT AWAY IF: You develop worsening fever. You become short of breath You cough up blood. Your symptoms persist after you have completed your treatment plan MAKE SURE YOU  Understand these instructions. Will watch your condition. Will get help right away if you are not doing well or get worse.    Thank you for choosing an e-visit.  Your e-visit answers were reviewed by a board certified advanced clinical practitioner to complete your personal care plan. Depending upon the condition, your plan could have included both over the counter or prescription medications.  Please review your pharmacy choice. Make sure the pharmacy is open so you can pick up prescription now. If there is a problem, you may contact your provider through [employer] and have the prescription routed to another pharmacy.  Your safety is important to [place]. If you have drug allergies check your prescription carefully.   For the next 24 hours you can use MyChart to ask questions about today's visit, request  a non-urgent call back, or ask for a work or school excuse.    have provided 5 minutes of non face to face time during this encounter for chart review and documentation.

## 2024-03-17 DIAGNOSIS — M7711 Lateral epicondylitis, right elbow: Secondary | ICD-10-CM | POA: Diagnosis not present

## 2024-03-17 DIAGNOSIS — M6281 Muscle weakness (generalized): Secondary | ICD-10-CM | POA: Diagnosis not present

## 2024-03-24 DIAGNOSIS — M7711 Lateral epicondylitis, right elbow: Secondary | ICD-10-CM | POA: Diagnosis not present

## 2024-03-24 DIAGNOSIS — M6281 Muscle weakness (generalized): Secondary | ICD-10-CM | POA: Diagnosis not present

## 2024-04-01 ENCOUNTER — Other Ambulatory Visit (HOSPITAL_COMMUNITY): Payer: Self-pay

## 2024-04-01 ENCOUNTER — Other Ambulatory Visit: Payer: Self-pay | Admitting: Endocrinology

## 2024-04-02 ENCOUNTER — Other Ambulatory Visit (HOSPITAL_COMMUNITY): Payer: Self-pay

## 2024-04-02 MED ORDER — MELOXICAM 15 MG PO TABS
15.0000 mg | ORAL_TABLET | Freq: Every day | ORAL | 1 refills | Status: DC
  Filled 2024-04-02: qty 30, 30d supply, fill #0
  Filled 2024-05-08 (×2): qty 30, 30d supply, fill #1

## 2024-04-04 ENCOUNTER — Other Ambulatory Visit (HOSPITAL_COMMUNITY): Payer: Self-pay

## 2024-04-07 ENCOUNTER — Ambulatory Visit: Payer: Self-pay

## 2024-04-07 ENCOUNTER — Other Ambulatory Visit: Payer: Self-pay | Admitting: Endocrinology

## 2024-04-07 ENCOUNTER — Other Ambulatory Visit (HOSPITAL_COMMUNITY): Payer: Self-pay

## 2024-04-07 NOTE — Telephone Encounter (Signed)
  Chief Complaint: medication refill  Disposition: [] ED /[] Urgent Care (no appt availability in office) / [] Appointment(In office/virtual)/ []  [facility]/ [] Home Care/ [] Refused Recommended Disposition /[] [facility]/ [x]  Follow-up with PCP Additional Notes: Patient calling in requesting her PCP Dr Okey Dupre manage and prescribe her synthroid. Last prescribed on 02/22/23 for a year by Endocrinologist, Dr Lucianne Muss. Patient states she is not established with him and used to see a different endocrine doctor. She is requesting a refill. Unable to pend and send as the medication has a warning that states someone is currently modifying the medication.  Copied from CRM [id number]. Topic: Clinical - Prescription Issue >> Apr 07, 2024  3:37 PM [place] E wrote: Reason for CRM: Patient tried calling in a refill of her medication twice for levothyroxine (SYNTHROID) 175 MCG tablet, but her pharmacy told her that this medication was denied by her PCP and she was not given a reason. Callback number for patient is [phone] to clarify. Reason for Disposition  [1] Prescription refill request for NON-ESSENTIAL medicine (i.e., no harm to patient if med not taken) AND [2] triager unable to refill per department policy  Answer Assessment - Initial Assessment Questions 1. DRUG NAME: "What medicine do you need to have refilled?"     Levothyroxine.  2. REFILLS REMAINING: "How many refills are remaining?" (Note: The label on the medicine or pill bottle will show how many refills are remaining. If there are no refills remaining, then a renewal may be needed.)     No.  3. EXPIRATION DATE: "What is the expiration date?" (Note: The label states when the prescription will expire, and thus can no longer be refilled.)     02/22/24.  4. PRESCRIBING HCP: "Who prescribed it?" Reason: If prescribed by specialist, call should be referred to that group.     Dr Lucianne Muss. Patient states she is not established with  her endocrinologist anymore and would like Dr Okey Dupre to manage her thyroid medication.  5. SYMPTOMS: "Do you have any symptoms?"     No.  6. PREGNANCY: "Is there any chance that you are pregnant?" "When was your last menstrual period?"     N/A.  Protocols used: Medication Refill and Renewal Call-A-AH

## 2024-04-09 ENCOUNTER — Other Ambulatory Visit: Payer: Self-pay

## 2024-04-09 ENCOUNTER — Other Ambulatory Visit (HOSPITAL_COMMUNITY): Payer: Self-pay

## 2024-04-09 DIAGNOSIS — E039 Hypothyroidism, unspecified: Secondary | ICD-10-CM

## 2024-04-09 MED ORDER — LEVOTHYROXINE SODIUM 175 MCG PO TABS
175.0000 ug | ORAL_TABLET | Freq: Every day | ORAL | 0 refills | Status: DC
  Filled 2024-04-09: qty 90, 90d supply, fill #0

## 2024-04-09 NOTE — Telephone Encounter (Signed)
 Ok for 90 day no refill

## 2024-04-09 NOTE — Telephone Encounter (Signed)
 Done

## 2024-05-08 ENCOUNTER — Other Ambulatory Visit (HOSPITAL_COMMUNITY): Payer: Self-pay

## 2024-05-08 ENCOUNTER — Other Ambulatory Visit: Payer: Self-pay

## 2024-05-18 DIAGNOSIS — Z1211 Encounter for screening for malignant neoplasm of colon: Secondary | ICD-10-CM | POA: Diagnosis not present

## 2024-05-18 DIAGNOSIS — R1013 Epigastric pain: Secondary | ICD-10-CM | POA: Diagnosis not present

## 2024-05-18 DIAGNOSIS — K219 Gastro-esophageal reflux disease without esophagitis: Secondary | ICD-10-CM | POA: Diagnosis not present

## 2024-05-18 DIAGNOSIS — Z83719 Family history of colon polyps, unspecified: Secondary | ICD-10-CM | POA: Diagnosis not present

## 2024-05-18 DIAGNOSIS — R1011 Right upper quadrant pain: Secondary | ICD-10-CM | POA: Diagnosis not present

## 2024-05-18 DIAGNOSIS — R1310 Dysphagia, unspecified: Secondary | ICD-10-CM | POA: Diagnosis not present

## 2024-05-19 ENCOUNTER — Other Ambulatory Visit (HOSPITAL_COMMUNITY): Payer: Self-pay

## 2024-05-19 ENCOUNTER — Other Ambulatory Visit: Payer: Self-pay

## 2024-05-19 MED ORDER — BISACODYL 5 MG PO TBEC
DELAYED_RELEASE_TABLET | ORAL | 0 refills | Status: DC
  Filled 2024-05-19: qty 4, 1d supply, fill #0

## 2024-05-19 MED ORDER — OMEPRAZOLE 40 MG PO CPDR
40.0000 mg | DELAYED_RELEASE_CAPSULE | Freq: Every morning | ORAL | 2 refills | Status: AC
  Filled 2024-05-19: qty 90, 90d supply, fill #0
  Filled 2024-09-14: qty 90, 90d supply, fill #1

## 2024-05-19 MED ORDER — PEG 3350-KCL-NA BICARB-NACL 420 G PO SOLR
ORAL | 0 refills | Status: DC
Start: 2024-05-18 — End: 2024-08-10
  Filled 2024-05-19: qty 4000, 1d supply, fill #0

## 2024-05-20 ENCOUNTER — Other Ambulatory Visit: Payer: Self-pay | Admitting: Nurse Practitioner

## 2024-05-20 DIAGNOSIS — R1310 Dysphagia, unspecified: Secondary | ICD-10-CM

## 2024-05-26 ENCOUNTER — Ambulatory Visit
Admission: RE | Admit: 2024-05-26 | Discharge: 2024-05-26 | Discharge status: Home / Self Care | Source: Ambulatory Visit | Attending: Nurse Practitioner | Admitting: Nurse Practitioner

## 2024-05-26 DIAGNOSIS — R1310 Dysphagia, unspecified: Secondary | ICD-10-CM

## 2024-05-26 DIAGNOSIS — R1011 Right upper quadrant pain: Secondary | ICD-10-CM | POA: Diagnosis not present

## 2024-05-27 DIAGNOSIS — M7711 Lateral epicondylitis, right elbow: Secondary | ICD-10-CM | POA: Diagnosis not present

## 2024-06-03 DIAGNOSIS — K259 Gastric ulcer, unspecified as acute or chronic, without hemorrhage or perforation: Secondary | ICD-10-CM | POA: Diagnosis not present

## 2024-06-03 DIAGNOSIS — K2930 Chronic superficial gastritis without bleeding: Secondary | ICD-10-CM | POA: Diagnosis not present

## 2024-06-03 DIAGNOSIS — R1310 Dysphagia, unspecified: Secondary | ICD-10-CM | POA: Diagnosis not present

## 2024-06-03 DIAGNOSIS — K5730 Diverticulosis of large intestine without perforation or abscess without bleeding: Secondary | ICD-10-CM | POA: Diagnosis not present

## 2024-06-03 DIAGNOSIS — Z1211 Encounter for screening for malignant neoplasm of colon: Secondary | ICD-10-CM | POA: Diagnosis not present

## 2024-06-03 DIAGNOSIS — K222 Esophageal obstruction: Secondary | ICD-10-CM | POA: Diagnosis not present

## 2024-06-03 DIAGNOSIS — R1013 Epigastric pain: Secondary | ICD-10-CM | POA: Diagnosis not present

## 2024-06-03 DIAGNOSIS — Z83719 Family history of colon polyps, unspecified: Secondary | ICD-10-CM | POA: Diagnosis not present

## 2024-06-08 ENCOUNTER — Other Ambulatory Visit: Payer: Self-pay | Admitting: Orthopedic Surgery

## 2024-06-08 ENCOUNTER — Other Ambulatory Visit (HOSPITAL_COMMUNITY): Payer: Self-pay

## 2024-06-08 MED ORDER — MELOXICAM 15 MG PO TABS
15.0000 mg | ORAL_TABLET | Freq: Every day | ORAL | 1 refills | Status: DC
  Filled 2024-06-08: qty 30, 30d supply, fill #0
  Filled 2024-07-10: qty 30, 30d supply, fill #1

## 2024-07-01 DIAGNOSIS — M7711 Lateral epicondylitis, right elbow: Secondary | ICD-10-CM | POA: Diagnosis not present

## 2024-07-10 ENCOUNTER — Encounter (HOSPITAL_COMMUNITY): Admission: RE | Admit: 2024-07-10 | Source: Ambulatory Visit

## 2024-07-10 ENCOUNTER — Other Ambulatory Visit: Payer: Self-pay | Admitting: Internal Medicine

## 2024-07-10 ENCOUNTER — Other Ambulatory Visit (HOSPITAL_COMMUNITY): Payer: Self-pay

## 2024-07-10 DIAGNOSIS — E039 Hypothyroidism, unspecified: Secondary | ICD-10-CM

## 2024-07-13 ENCOUNTER — Other Ambulatory Visit (HOSPITAL_COMMUNITY): Payer: Self-pay

## 2024-07-13 MED ORDER — LEVOTHYROXINE SODIUM 175 MCG PO TABS
175.0000 ug | ORAL_TABLET | Freq: Every day | ORAL | 0 refills | Status: DC
  Filled 2024-07-13: qty 30, 30d supply, fill #0

## 2024-07-15 DIAGNOSIS — M359 Systemic involvement of connective tissue, unspecified: Secondary | ICD-10-CM | POA: Diagnosis not present

## 2024-07-15 DIAGNOSIS — Z6831 Body mass index (BMI) 31.0-31.9, adult: Secondary | ICD-10-CM | POA: Diagnosis not present

## 2024-07-15 DIAGNOSIS — Z111 Encounter for screening for respiratory tuberculosis: Secondary | ICD-10-CM | POA: Diagnosis not present

## 2024-07-15 DIAGNOSIS — R5383 Other fatigue: Secondary | ICD-10-CM | POA: Diagnosis not present

## 2024-07-15 DIAGNOSIS — M2559 Pain in other specified joint: Secondary | ICD-10-CM | POA: Diagnosis not present

## 2024-07-15 DIAGNOSIS — E669 Obesity, unspecified: Secondary | ICD-10-CM | POA: Diagnosis not present

## 2024-07-23 ENCOUNTER — Ambulatory Visit (HOSPITAL_COMMUNITY): Admit: 2024-07-23 | Admitting: Orthopedic Surgery

## 2024-07-23 SURGERY — EXPLORATION, TENDON
Anesthesia: Choice | Laterality: Right

## 2024-08-03 ENCOUNTER — Other Ambulatory Visit (HOSPITAL_BASED_OUTPATIENT_CLINIC_OR_DEPARTMENT_OTHER): Payer: Self-pay

## 2024-08-03 ENCOUNTER — Other Ambulatory Visit (HOSPITAL_COMMUNITY): Payer: Self-pay

## 2024-08-03 ENCOUNTER — Other Ambulatory Visit: Payer: Self-pay

## 2024-08-03 DIAGNOSIS — M2559 Pain in other specified joint: Secondary | ICD-10-CM | POA: Diagnosis not present

## 2024-08-03 DIAGNOSIS — E669 Obesity, unspecified: Secondary | ICD-10-CM | POA: Diagnosis not present

## 2024-08-03 DIAGNOSIS — R5383 Other fatigue: Secondary | ICD-10-CM | POA: Diagnosis not present

## 2024-08-03 DIAGNOSIS — M359 Systemic involvement of connective tissue, unspecified: Secondary | ICD-10-CM | POA: Diagnosis not present

## 2024-08-03 DIAGNOSIS — M45A0 Non-radiographic axial spondyloarthritis of unspecified sites in spine: Secondary | ICD-10-CM | POA: Diagnosis not present

## 2024-08-03 DIAGNOSIS — Z6831 Body mass index (BMI) 31.0-31.9, adult: Secondary | ICD-10-CM | POA: Diagnosis not present

## 2024-08-03 DIAGNOSIS — Z111 Encounter for screening for respiratory tuberculosis: Secondary | ICD-10-CM | POA: Diagnosis not present

## 2024-08-03 MED ORDER — CIMZIA (2 SYRINGE) 200 MG/ML ~~LOC~~ PSKT: PREFILLED_SYRINGE | SUBCUTANEOUS | 5 refills | Status: DC

## 2024-08-03 MED ORDER — CIMZIA-STARTER 200 MG/ML ~~LOC~~ PSKT: PREFILLED_SYRINGE | SUBCUTANEOUS | 0 refills | Status: DC

## 2024-08-04 ENCOUNTER — Other Ambulatory Visit (HOSPITAL_COMMUNITY): Payer: Self-pay

## 2024-08-05 ENCOUNTER — Other Ambulatory Visit: Payer: Self-pay

## 2024-08-05 ENCOUNTER — Other Ambulatory Visit: Payer: Self-pay | Admitting: Pharmacist

## 2024-08-05 ENCOUNTER — Ambulatory Visit: Attending: Internal Medicine | Admitting: Pharmacist

## 2024-08-05 ENCOUNTER — Encounter: Payer: Self-pay | Admitting: Pharmacist

## 2024-08-05 DIAGNOSIS — Z79899 Other long term (current) drug therapy: Secondary | ICD-10-CM

## 2024-08-05 MED ORDER — CIMZIA (2 SYRINGE) 200 MG/ML ~~LOC~~ PSKT
PREFILLED_SYRINGE | SUBCUTANEOUS | 5 refills | Status: AC
  Filled 2024-08-05: qty 2, fill #0
  Filled 2024-10-09: qty 1, 28d supply, fill #0
  Filled 2024-11-05: qty 1, 28d supply, fill #1
  Filled 2024-12-04: qty 1, 28d supply, fill #2
  Filled 2024-12-30 – 2025-01-26 (×3): qty 1, 28d supply, fill #3

## 2024-08-05 MED ORDER — CIMZIA (2 SYRINGE) 200 MG/ML ~~LOC~~ PSKT
PREFILLED_SYRINGE | SUBCUTANEOUS | 0 refills | Status: DC
  Filled 2024-08-05: qty 6, fill #0

## 2024-08-05 NOTE — Progress Notes (Signed)
   S: Patient presents today for review of their specialty medication.   Patient is currently taking Cimzia  (certolizumab) for spondyloarthritis. Patient is managed by Dr. Mai for this.   Dosing: Axial spondyloarthritis: SubQ: Initial: 400 mg, repeat dose 2 and 4 weeks after initial dose; Maintenance: 200 mg every 2 weeks or 400 mg every 4 weeks.  Adherence: has not yet started   Efficacy: has not yet started   Current adverse effects: has not yet started   O:     Lab Results  Component Value Date   WBC 7.1 06/05/2023   HGB 12.0 06/05/2023   HCT 37.1 06/05/2023   MCV 88.1 06/05/2023   PLT 236.0 06/05/2023      Chemistry      Component Value Date/Time   NA 142 06/05/2023 0833   NA 138 08/18/2019 1147   K 4.7 06/05/2023 0833   CL 106 06/05/2023 0833   CO2 23 06/05/2023 0833   BUN 11 06/05/2023 0833   BUN 8 08/18/2019 1147   CREATININE 0.66 06/05/2023 0833   CREATININE 0.57 05/01/2022 1402      Component Value Date/Time   CALCIUM 9.0 06/05/2023 0833   ALKPHOS 62 06/05/2023 0833   AST 15 06/05/2023 0833   ALT 11 06/05/2023 0833   BILITOT 0.3 06/05/2023 0833   BILITOT 0.4 08/18/2019 1147       A/P: 1. Medication review: patient currently prescribed Cimzia  for spondyloarthritis. Reviewed the medication with the patient, including the following: Cimzia  is a TNF blocking agent used in the treatment of ankylosing spondylitis, axial spondyloarthritis, Crohn disease, plaque psoriasis, psoriatic arthritis, and rheumatoid arthritis. The medication should be room temp prior to administration an should be administered subcutaneously. Rotate injection sites and do not inject into damaged skin or into moles or scars. Possible adverse effects include GI upset, antibody development, increased risk of infection, hypersensitivity, demyelinating CNS disease, hematologic effects, and increased risk of malignancy. Use with caution in patients with heart failure. Avoid use of live  vaccinations and let doctor know of any infections as treatment with Cimzia  may need to be held during illness. No recommendations for any changes.  Herlene Fleeta Morris, PharmD, JAQUELINE, CPP Clinical Pharmacist [facility] [phone]

## 2024-08-05 NOTE — Progress Notes (Signed)
 See OV from 08/05/24 for complete documentation.   Herlene Fleeta Morris, PharmD, JAQUELINE, CPP Clinical Pharmacist [facility] [phone]

## 2024-08-06 ENCOUNTER — Other Ambulatory Visit: Payer: Self-pay

## 2024-08-06 NOTE — Progress Notes (Signed)
 Pharmacy Patient Advocate Encounter   Received notification from Patient Pharmacy that prior authorization for Cimzia  is required/requested.   Insurance verification completed.   The patient is insured through [facility] .   Per test claim: PA required; PA submitted to above mentioned insurance via Latent Key/confirmation #/EOC [id number] Status is pending

## 2024-08-06 NOTE — Progress Notes (Signed)
 Pharmacy Patient Advocate Encounter  Received notification from [employer] that Prior Authorization for Cimzia  has been DENIED.      PA #/Case ID/Reference #: [id number]  Insurance requires patient to have tried two preferred medications first. Faxed denial letter to provider.

## 2024-08-07 ENCOUNTER — Other Ambulatory Visit: Payer: Self-pay

## 2024-08-10 ENCOUNTER — Ambulatory Visit: Admitting: Internal Medicine

## 2024-08-10 ENCOUNTER — Encounter: Payer: Self-pay | Admitting: Internal Medicine

## 2024-08-10 ENCOUNTER — Other Ambulatory Visit (HOSPITAL_COMMUNITY): Payer: Self-pay

## 2024-08-10 VITALS — BP 118/70 | HR 85 | Temp 99.0°F | Ht 66.5 in | Wt 204.0 lb

## 2024-08-10 DIAGNOSIS — Z0000 Encounter for general adult medical examination without abnormal findings: Secondary | ICD-10-CM | POA: Diagnosis not present

## 2024-08-10 DIAGNOSIS — E039 Hypothyroidism, unspecified: Secondary | ICD-10-CM

## 2024-08-10 LAB — LIPID PANEL
Cholesterol: 182 mg/dL (ref 0–200)
HDL: 47.2 mg/dL (ref >39.00)
LDL Cholesterol: 107 mg/dL — ABNORMAL HIGH (ref 0–99)
NonHDL: 135.26
Total CHOL/HDL Ratio: 4
Triglycerides: 141 mg/dL (ref 0.0–149.0)
VLDL: 28.2 mg/dL (ref 0.0–40.0)

## 2024-08-10 LAB — T4, FREE: Free T4: 1.15 ng/dL (ref 0.60–1.60)

## 2024-08-10 LAB — TSH: TSH: 0.77 u[IU]/mL (ref 0.35–5.50)

## 2024-08-10 MED ORDER — LEVOTHYROXINE SODIUM 175 MCG PO TABS
175.0000 ug | ORAL_TABLET | Freq: Every day | ORAL | 3 refills | Status: AC
  Filled 2024-08-10: qty 90, 90d supply, fill #0
  Filled 2024-11-13: qty 90, 90d supply, fill #1

## 2024-08-10 NOTE — Progress Notes (Signed)
   Subjective:   Patient ID: Kayla Salazar, female    DOB: 1976/02/23, 48 y.o.   MRN: [id number]  The patient is here for physical. Pertinent topics   PMH, [facility], social history reviewed and updated  Review of Systems  Constitutional: Negative.   HENT: Negative.    Eyes: Negative.   Respiratory:  Negative for cough, chest tightness and shortness of breath.   Cardiovascular:  Negative for chest pain, palpitations and leg swelling.  Gastrointestinal:  Negative for abdominal distention, abdominal pain, constipation, diarrhea, nausea and vomiting.  Musculoskeletal: Negative.   Skin: Negative.   Neurological: Negative.   Psychiatric/Behavioral: Negative.      Objective:  Physical Exam Constitutional:      Appearance: She is well-developed.  HENT:     Head: Normocephalic and atraumatic.  Cardiovascular:     Rate and Rhythm: Normal rate and regular rhythm.  Pulmonary:     Effort: Pulmonary effort is normal. No respiratory distress.     Breath sounds: Normal breath sounds. No wheezing or rales.  Abdominal:     General: Bowel sounds are normal. There is no distension.     Palpations: Abdomen is soft.     Tenderness: There is no abdominal tenderness. There is no rebound.  Musculoskeletal:     Cervical back: Normal range of motion.  Skin:    General: Skin is warm and dry.  Neurological:     Mental Status: She is alert and oriented to person, place, and time.     Coordination: Coordination normal.     Vitals:   08/10/24 1539  BP: 118/70  Pulse: 85  Temp: 99 F (37.2 C)  TempSrc: Oral  SpO2: 95%  Weight: 204 lb (92.5 kg)  Height: 5' 6.5 (1.689 m)    Assessment & Plan:

## 2024-08-11 ENCOUNTER — Other Ambulatory Visit: Payer: Self-pay | Admitting: Internal Medicine

## 2024-08-11 ENCOUNTER — Ambulatory Visit: Payer: Self-pay | Admitting: Internal Medicine

## 2024-08-11 DIAGNOSIS — Z1231 Encounter for screening mammogram for malignant neoplasm of breast: Secondary | ICD-10-CM

## 2024-08-13 DIAGNOSIS — Z6832 Body mass index (BMI) 32.0-32.9, adult: Secondary | ICD-10-CM | POA: Diagnosis not present

## 2024-08-13 DIAGNOSIS — E559 Vitamin D deficiency, unspecified: Secondary | ICD-10-CM | POA: Diagnosis not present

## 2024-08-13 DIAGNOSIS — Z136 Encounter for screening for cardiovascular disorders: Secondary | ICD-10-CM | POA: Diagnosis not present

## 2024-08-13 DIAGNOSIS — E611 Iron deficiency: Secondary | ICD-10-CM | POA: Diagnosis not present

## 2024-08-13 DIAGNOSIS — E039 Hypothyroidism, unspecified: Secondary | ICD-10-CM | POA: Diagnosis not present

## 2024-08-13 DIAGNOSIS — N951 Menopausal and female climacteric states: Secondary | ICD-10-CM | POA: Diagnosis not present

## 2024-08-13 DIAGNOSIS — Z131 Encounter for screening for diabetes mellitus: Secondary | ICD-10-CM | POA: Diagnosis not present

## 2024-08-13 DIAGNOSIS — R635 Abnormal weight gain: Secondary | ICD-10-CM | POA: Diagnosis not present

## 2024-08-13 NOTE — Assessment & Plan Note (Signed)
 Checking TSH and adjust as needed synthroid .

## 2024-08-13 NOTE — Assessment & Plan Note (Signed)
Flu shot yearly. Tetanus up to date. Colonoscopy up to date. Mammogram up to date, pap smear up to date. Counseled about sun safety and mole surveillance. Counseled about the dangers of distracted driving. Given 10 year screening recommendations.   

## 2024-08-14 ENCOUNTER — Other Ambulatory Visit: Payer: Self-pay

## 2024-08-14 LAB — LAB REPORT - SCANNED
A1c: 5.2
EGFR: 99

## 2024-08-17 ENCOUNTER — Other Ambulatory Visit (HOSPITAL_COMMUNITY): Payer: Self-pay

## 2024-08-17 ENCOUNTER — Other Ambulatory Visit: Payer: Self-pay | Admitting: Internal Medicine

## 2024-08-18 ENCOUNTER — Other Ambulatory Visit (HOSPITAL_COMMUNITY): Payer: Self-pay

## 2024-08-18 ENCOUNTER — Other Ambulatory Visit: Payer: Self-pay

## 2024-08-18 DIAGNOSIS — N951 Menopausal and female climacteric states: Secondary | ICD-10-CM | POA: Diagnosis not present

## 2024-08-18 DIAGNOSIS — E7800 Pure hypercholesterolemia, unspecified: Secondary | ICD-10-CM | POA: Diagnosis not present

## 2024-08-18 DIAGNOSIS — N898 Other specified noninflammatory disorders of vagina: Secondary | ICD-10-CM | POA: Diagnosis not present

## 2024-08-18 DIAGNOSIS — E063 Autoimmune thyroiditis: Secondary | ICD-10-CM | POA: Diagnosis not present

## 2024-08-18 DIAGNOSIS — E559 Vitamin D deficiency, unspecified: Secondary | ICD-10-CM | POA: Diagnosis not present

## 2024-08-18 DIAGNOSIS — E669 Obesity, unspecified: Secondary | ICD-10-CM | POA: Diagnosis not present

## 2024-08-18 DIAGNOSIS — R635 Abnormal weight gain: Secondary | ICD-10-CM | POA: Diagnosis not present

## 2024-08-18 DIAGNOSIS — Z1339 Encounter for screening examination for other mental health and behavioral disorders: Secondary | ICD-10-CM | POA: Diagnosis not present

## 2024-08-18 DIAGNOSIS — Z1331 Encounter for screening for depression: Secondary | ICD-10-CM | POA: Diagnosis not present

## 2024-08-18 DIAGNOSIS — R5383 Other fatigue: Secondary | ICD-10-CM | POA: Diagnosis not present

## 2024-08-18 MED ORDER — PROGESTERONE MICRONIZED 100 MG PO CAPS
100.0000 mg | ORAL_CAPSULE | Freq: Every day | ORAL | 1 refills | Status: DC
  Filled 2024-08-18: qty 60, 60d supply, fill #0
  Filled 2024-10-09: qty 60, 60d supply, fill #1

## 2024-08-18 MED ORDER — ESTRADIOL 0.0375 MG/24HR TD PTTW
1.0000 | MEDICATED_PATCH | TRANSDERMAL | 1 refills | Status: DC
  Filled 2024-08-18: qty 8, 28d supply, fill #0
  Filled 2024-09-14: qty 8, 28d supply, fill #1

## 2024-08-18 NOTE — Progress Notes (Signed)
 Dis-enrolling. Medication not covered and step therapy required.

## 2024-08-19 ENCOUNTER — Telehealth: Payer: Self-pay

## 2024-08-19 NOTE — Telephone Encounter (Signed)
 Patient called stating she has gotten labs done somewhere else and will be faxing them over to our office for the provider to review

## 2024-08-19 NOTE — Telephone Encounter (Signed)
 Patient has called in letting us  know that she will be faxing over labs results she had done else where to have Dr Rollene review.

## 2024-08-19 NOTE — Telephone Encounter (Signed)
 Copied from CRM #[id number]. Topic: Clinical - Lab/Test Results >> Aug 19, 2024  8:59 AM Kayla Salazar wrote: Reason for CRM: Patient got additional lab work after her visit and she wanted to update Dr. Rollene on the lab results, patient can be reached at [phone].

## 2024-08-20 ENCOUNTER — Encounter: Payer: Self-pay | Admitting: Internal Medicine

## 2024-08-24 ENCOUNTER — Other Ambulatory Visit: Payer: Self-pay

## 2024-08-25 DIAGNOSIS — Z6831 Body mass index (BMI) 31.0-31.9, adult: Secondary | ICD-10-CM | POA: Diagnosis not present

## 2024-08-25 DIAGNOSIS — E7800 Pure hypercholesterolemia, unspecified: Secondary | ICD-10-CM | POA: Diagnosis not present

## 2024-09-01 DIAGNOSIS — Z6831 Body mass index (BMI) 31.0-31.9, adult: Secondary | ICD-10-CM | POA: Diagnosis not present

## 2024-09-01 DIAGNOSIS — E559 Vitamin D deficiency, unspecified: Secondary | ICD-10-CM | POA: Diagnosis not present

## 2024-09-01 DIAGNOSIS — E039 Hypothyroidism, unspecified: Secondary | ICD-10-CM | POA: Diagnosis not present

## 2024-09-03 ENCOUNTER — Other Ambulatory Visit: Payer: Self-pay

## 2024-09-04 DIAGNOSIS — K270 Acute peptic ulcer, site unspecified, with hemorrhage: Secondary | ICD-10-CM | POA: Diagnosis not present

## 2024-09-04 DIAGNOSIS — K449 Diaphragmatic hernia without obstruction or gangrene: Secondary | ICD-10-CM | POA: Diagnosis not present

## 2024-09-04 DIAGNOSIS — R1311 Dysphagia, oral phase: Secondary | ICD-10-CM | POA: Diagnosis not present

## 2024-09-09 DIAGNOSIS — M2550 Pain in unspecified joint: Secondary | ICD-10-CM | POA: Diagnosis not present

## 2024-09-09 DIAGNOSIS — Z6831 Body mass index (BMI) 31.0-31.9, adult: Secondary | ICD-10-CM | POA: Diagnosis not present

## 2024-09-10 ENCOUNTER — Other Ambulatory Visit (HOSPITAL_COMMUNITY): Payer: Self-pay

## 2024-09-11 ENCOUNTER — Other Ambulatory Visit: Payer: Self-pay

## 2024-09-11 ENCOUNTER — Other Ambulatory Visit (HOSPITAL_COMMUNITY): Payer: Self-pay

## 2024-09-11 MED ORDER — CIMZIA (2 SYRINGE) 200 MG/ML ~~LOC~~ PSKT: PREFILLED_SYRINGE | SUBCUTANEOUS | 3 refills | Status: DC

## 2024-09-11 MED ORDER — CIMZIA (2 SYRINGE) 200 MG/ML ~~LOC~~ PSKT: PREFILLED_SYRINGE | SUBCUTANEOUS | 6 refills | Status: DC

## 2024-09-11 MED ORDER — CIMZIA (2 SYRINGE) 200 MG/ML ~~LOC~~ PSKT: PREFILLED_SYRINGE | SUBCUTANEOUS | 0 refills | Status: DC

## 2024-09-11 NOTE — Progress Notes (Signed)
 PA approved.  Pharmacy Patient Advocate Encounter  Insurance verification completed.   The patient is insured through [facility]   Ran test claim for Cimzia . Co-pay is $0. Patient has copay card.   This test claim was processed through [facility]- copay amounts may vary at other pharmacies due to pharmacy/plan contracts, or as the patient moves through the different stages of their insurance plan.

## 2024-09-11 NOTE — Progress Notes (Signed)
 Specialty Pharmacy Initial Fill Coordination Note  Kayla Salazar is a 48 y.o. female contacted today regarding initial fill of specialty medication(s) Certolizumab Pegol  (Cimzia  (2 Syringe), Cimzia -Starter)   Patient requested Pickup at [facility] at [place] date: 09/15/24   Medication will be filled on 9/16.   Patient is aware of $0 copayment.

## 2024-09-14 ENCOUNTER — Other Ambulatory Visit: Payer: Self-pay

## 2024-09-14 ENCOUNTER — Other Ambulatory Visit: Payer: Self-pay | Admitting: Pharmacist

## 2024-09-14 ENCOUNTER — Other Ambulatory Visit (HOSPITAL_COMMUNITY): Payer: Self-pay

## 2024-09-14 MED ORDER — CIMZIA-STARTER 200 MG/ML ~~LOC~~ PSKT
PREFILLED_SYRINGE | SUBCUTANEOUS | 0 refills | Status: AC
  Filled 2024-09-14: qty 3, 28d supply, fill #0

## 2024-09-15 ENCOUNTER — Other Ambulatory Visit: Payer: Self-pay

## 2024-09-16 DIAGNOSIS — Z6831 Body mass index (BMI) 31.0-31.9, adult: Secondary | ICD-10-CM | POA: Diagnosis not present

## 2024-09-16 DIAGNOSIS — E039 Hypothyroidism, unspecified: Secondary | ICD-10-CM | POA: Diagnosis not present

## 2024-10-09 ENCOUNTER — Other Ambulatory Visit: Payer: Self-pay

## 2024-10-09 ENCOUNTER — Other Ambulatory Visit (HOSPITAL_COMMUNITY): Payer: Self-pay

## 2024-10-09 NOTE — Progress Notes (Signed)
 Specialty Pharmacy Refill Coordination Note  Kayla Salazar is a 48 y.o. female contacted today regarding refills of specialty medication(s) Certolizumab Pegol  (Cimzia -Starter, Cimzia  (2 Syringe))   Patient requested Pickup at [facility] at [place] date: 10/13/24   Medication will be filled on 10/12/24.   Patient is aware that medication is pending prior authorization. If not available by 10/14 for pickup, please let patient know.

## 2024-10-12 ENCOUNTER — Other Ambulatory Visit: Payer: Self-pay

## 2024-10-12 ENCOUNTER — Telehealth: Payer: Self-pay

## 2024-10-12 NOTE — Telephone Encounter (Signed)
 Pharmacy Patient Advocate Encounter   Received notification from Patient Pharmacy that prior authorization for Cimzia  is required/requested.   Insurance verification completed.   The patient is insured through [facility].   Per test claim: PA required; PA submitted to above mentioned insurance via Latent Key/confirmation #/EOC [id number] Status is pending

## 2024-10-12 NOTE — Telephone Encounter (Signed)
 Pharmacy Patient Advocate Encounter  Received notification from [facility] that Prior Authorization for Cimzia  has been APPROVED from 10/12/24 to 10/11/25   PA #/Case ID/Reference #: [id number]

## 2024-10-15 ENCOUNTER — Other Ambulatory Visit (HOSPITAL_COMMUNITY): Payer: Self-pay

## 2024-10-20 ENCOUNTER — Ambulatory Visit
Admission: RE | Admit: 2024-10-20 | Discharge: 2024-10-20 | Disposition: A | Discharge status: Home / Self Care | Source: Ambulatory Visit | Attending: Internal Medicine | Admitting: Internal Medicine

## 2024-10-20 ENCOUNTER — Encounter: Payer: Self-pay | Admitting: Internal Medicine

## 2024-10-20 DIAGNOSIS — Z1231 Encounter for screening mammogram for malignant neoplasm of breast: Secondary | ICD-10-CM

## 2024-10-23 ENCOUNTER — Ambulatory Visit: Payer: Self-pay | Admitting: Internal Medicine

## 2024-10-23 LAB — HM MAMMOGRAPHY

## 2024-10-29 ENCOUNTER — Other Ambulatory Visit (HOSPITAL_COMMUNITY): Payer: Self-pay

## 2024-10-30 ENCOUNTER — Ambulatory Visit: Admitting: Internal Medicine

## 2024-10-30 ENCOUNTER — Encounter: Payer: Self-pay | Admitting: Internal Medicine

## 2024-10-30 ENCOUNTER — Other Ambulatory Visit (HOSPITAL_COMMUNITY): Payer: Self-pay

## 2024-10-30 VITALS — BP 110/80 | HR 61 | Temp 98.6°F | Ht 66.5 in | Wt 199.4 lb

## 2024-10-30 DIAGNOSIS — N951 Menopausal and female climacteric states: Secondary | ICD-10-CM | POA: Diagnosis not present

## 2024-10-30 MED ORDER — ESTRADIOL 0.05 MG/24HR TD PTTW
1.0000 | MEDICATED_PATCH | TRANSDERMAL | 1 refills | Status: AC
  Filled 2024-10-30: qty 24, 84d supply, fill #0
  Filled 2025-02-05: qty 24, 84d supply, fill #1

## 2024-10-30 NOTE — Progress Notes (Signed)
   Subjective:   Patient ID: Kayla Salazar, female    DOB: [birth year], 48 y.o.   MRN: [id number]  Discussed the use of AI scribe software for clinical note transcription with the patient, who gave verbal consent to proceed.  History of Present Illness Kayla Salazar is a 48 year old female who presents for management of hormone replacement therapy.  She has been experiencing menopausal symptoms, including hot flashes and sleep disturbances. Hormone replacement therapy was initiated at [facility] office, which initially alleviated her hot flashes. However, the therapy is out of network, resulting in high costs. She continues to experience hot flashes, particularly at the end and beginning of her estrogen patch cycle, and has difficulty with sleep, waking up in the middle of the night and struggling to return to sleep.  She has been on hormone replacement therapy for about two months, using an estrogen patch and progesterone . She reports that progesterone  makes her drowsy at night. Additionally, she was prescribed a testosterone cream to address low libido, as she reports having no libido.  Her past medical history includes Hashimoto's thyroiditis, diagnosed through blood work at the [facility]. She has been on thyroid  medication prior to this diagnosis. She does not have a uterus but retains her ovaries, which influences her hormone replacement therapy regimen.  Review of Systems  Constitutional: Negative.   HENT: Negative.    Eyes: Negative.   Respiratory:  Negative for cough, chest tightness and shortness of breath.   Cardiovascular:  Negative for chest pain, palpitations and leg swelling.  Gastrointestinal:  Negative for abdominal distention, abdominal pain, constipation, diarrhea, nausea and vomiting.  Endocrine: Positive for heat intolerance.  Musculoskeletal: Negative.   Skin: Negative.   Neurological: Negative.   Psychiatric/Behavioral: Negative.      Objective:  Physical  Exam Constitutional:      Appearance: She is well-developed.  HENT:     Head: Normocephalic and atraumatic.  Cardiovascular:     Rate and Rhythm: Normal rate and regular rhythm.  Pulmonary:     Effort: Pulmonary effort is normal. No respiratory distress.     Breath sounds: Normal breath sounds. No wheezing or rales.  Abdominal:     General: Bowel sounds are normal. There is no distension.     Palpations: Abdomen is soft.     Tenderness: There is no abdominal tenderness.  Musculoskeletal:     Cervical back: Normal range of motion.  Skin:    General: Skin is warm and dry.  Neurological:     Mental Status: She is alert and oriented to person, place, and time.     Coordination: Coordination normal.     Vitals:   10/30/24 0806  BP: 110/80  Pulse: 61  Temp: 98.6 F (37 C)  TempSrc: Oral  SpO2: 98%  Weight: 199 lb 6.4 oz (90.4 kg)  Height: 5' 6.5 (1.689 m)    Assessment and Plan Assessment & Plan Menopausal symptoms   She experiences persistent hot flashes and sleep disturbances on current HRT. Progesterone  is unnecessary post-hysterectomy, and estrogen is primary for symptom relief. Testosterone cream lacks evidence for improving libido.  Increase the estrogen patch dose to 0.05 mg and discontinue progesterone . Adjust the estrogen dose as needed after 2-3 months. Consider topical estrogen cream if vaginal dryness occurs.

## 2024-10-30 NOTE — Patient Instructions (Signed)
 We will stop the progesterone .   We have sent in the increased dose of the estrogen patches.

## 2024-10-30 NOTE — Assessment & Plan Note (Signed)
 She experiences persistent hot flashes and sleep disturbances on current HRT. Progesterone  is unnecessary post-hysterectomy, and estrogen is primary for symptom relief. Testosterone cream lacks evidence for improving libido.  Increase the estrogen patch dose to 0.05 mg and discontinue progesterone . Adjust the estrogen dose as needed after 2-3 months. Consider topical estrogen cream if vaginal dryness occurs.

## 2024-11-02 ENCOUNTER — Other Ambulatory Visit (HOSPITAL_COMMUNITY): Payer: Self-pay

## 2024-11-03 ENCOUNTER — Encounter (INDEPENDENT_AMBULATORY_CARE_PROVIDER_SITE_OTHER): Payer: Self-pay | Admitting: Nurse Practitioner

## 2024-11-03 ENCOUNTER — Ambulatory Visit (INDEPENDENT_AMBULATORY_CARE_PROVIDER_SITE_OTHER): Admitting: Nurse Practitioner

## 2024-11-03 VITALS — BP 122/76 | HR 64 | Temp 98.5°F | Ht 66.5 in | Wt 197.0 lb

## 2024-11-03 DIAGNOSIS — E063 Autoimmune thyroiditis: Secondary | ICD-10-CM

## 2024-11-03 DIAGNOSIS — E7800 Pure hypercholesterolemia, unspecified: Secondary | ICD-10-CM

## 2024-11-03 DIAGNOSIS — E66811 Obesity, class 1: Secondary | ICD-10-CM | POA: Diagnosis not present

## 2024-11-03 DIAGNOSIS — M45A0 Non-radiographic axial spondyloarthritis of unspecified sites in spine: Secondary | ICD-10-CM

## 2024-11-03 DIAGNOSIS — Z0289 Encounter for other administrative examinations: Secondary | ICD-10-CM

## 2024-11-03 DIAGNOSIS — Z6831 Body mass index (BMI) 31.0-31.9, adult: Secondary | ICD-10-CM | POA: Diagnosis not present

## 2024-11-03 DIAGNOSIS — E559 Vitamin D deficiency, unspecified: Secondary | ICD-10-CM | POA: Diagnosis not present

## 2024-11-03 NOTE — Progress Notes (Signed)
 [address] [place], [place], [place] [address] Office: [phone]  /  Fax: [phone]   Initial Consultation    Kayla Salazar was seen in clinic today to evaluate for obesity. She is interested in losing weight to improve overall health and reduce the risk of weight related complications. She presents today to review program treatment options, initial physical assessment, and evaluation.    She has been having weight gain since starting menopause.  She went to [facility] and started on HRT and weight loss management.  Insurance did not cover her visits to their facility.  She did have a recent EKG, indirect calorimetry and fasting lab work.  She does not want to repeat if possible. She is on estradiol  patches for  HRT- had previous vaginal hysterectomy 22 years ago and started having recent hot flashes for approximately 1 year, better controlled with HRT. She first noticed weight gain about 15 years ago and has gradual weight gain since that time.  She has tried portion control , high protein and weight watchers with limited weight loss which has been very frustrating.  At [facility] she was told to eat only so many calories but given no meal plan.  She does have Hashimoto's thyroiditis and is on Levothyroxine  175 mcg every day.  She also had spondyloarthritis and is currently on Cimzia  which she just started- still has a lot of joint and back pain.  Anthropometrics and Bioimpedance Analysis   Body mass index is 31.32 kg/m. Body Fat Mass : 43 % Visceral Fat Mass Rating : 10   Obesity Related Diseases and Complications  Obesity Quality of Life and Psychosocial Complications: Body image dissatisfaction and Reduced health-related quality of life  Cardiometabolic: Dyslipidemia or hypercholesterolemia, DOE, and Fatigue  Biomechanical: Osteoarthritis of the knee or hip, Low back pain, and GERD   Weight Related History  She was referred by: Self-Referral  When asked what they would like to  accomplish? She states: Adopt a healthier eating pattern and lifestyle, Improve energy levels and physical activity, Improve existing medical conditions, Improve quality of life, Improve appearance, and Improve self-confidence  Weight history: In the past 15 years she has noticed gradual weight gain.  She always lost her baby weight  Highest weight: 200  Contributing factors: family history of obesity, consumption of processed foods, use of obesogenic medications: Contraceptives or hormonal therapy, moderate to high levels of stress, reduced physical activity, menopause, strong orexigenic signaling and/or inadequate inhibitory control , slow metabolism for age, multiple weight loss attempts in the past, sedentary job, hectic pace of life, need for convenient foods, and self - critic or all-or-none mindset  Prior weight loss attempts: Weight Watchers, Tracking and Journaling, Balanced Plate / Portion Control, and High Protein  Current or previous pharmacotherapy: GLP-1  Response to medication: Ineffective so it was discontinued, only on for 6 weeks.   Current nutrition plan: None  Greatest challenge with dieting: difficulty maintaining reduced calorie state and meal preparation and cooking.  Current level of physical activity: None  Barriers to Exercise: energy and chronic pain  Readiness and Motivation  On a scale from 0 to 10 How ready are you to make changes to your eating and physical activity to lose weight? 10 How important is it for you to lose weight right now ? 10 How confident are you that you can lose weight if you try? 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  IVP done in 2012.   Gastric ulcer    History of COVID-19 12/2020   Iron deficiency anemia    hx iron infusions with Dr. Timmy   Thyroid  disease    hypothyroid     Objective    BP  122/76   Pulse 64   Temp 98.5 F (36.9 C)   Ht 5' 6.5 (1.689 m)   Wt 197 lb (89.4 kg)   LMP 06/22/2011   SpO2 96%   BMI 31.32 kg/m  She was weighed on the bioimpedance scale: Body mass index is 31.32 kg/m.    General:  Alert, oriented and cooperative. Patient is in no acute distress.  Respiratory: Normal respiratory effort, no problems with respiration noted   Gait: able to ambulate independently  Mental Status: Normal mood and affect. Normal behavior. Normal judgment and thought content.   Diagnostic Data Reviewed  BMET    Component Value Date/Time   NA 142 06/05/2023 0833   NA 138 08/18/2019 1147   K 4.7 06/05/2023 0833   CL 106 06/05/2023 0833   CO2 23 06/05/2023 0833   GLUCOSE 90 06/05/2023 0833   BUN 11 06/05/2023 0833   BUN 8 08/18/2019 1147   CREATININE 0.66 06/05/2023 0833   CREATININE 0.57 05/01/2022 1402   CALCIUM 9.0 06/05/2023 0833   GFRNONAA 112 08/18/2019 1147   GFRAA 130 08/18/2019 1147   No results found for: HGBA1C No results found for: INSULIN CBC    Component Value Date/Time   WBC 7.1 06/05/2023 0833   RBC 4.21 06/05/2023 0833   HGB 12.0 06/05/2023 0833   HGB 12.1 08/18/2019 1147   HGB 11.6 (L) 10/15/2016 0954   HGB 11.7 11/10/2013 0935   HGB 9.3 (L) 09/07/2013 1618   HCT 37.1 06/05/2023 0833   HCT 37.5 08/18/2019 1147   HCT 36.2 11/10/2013 0935   HCT 28.9 (L) 09/07/2013 1618   PLT 236.0 06/05/2023 0833   PLT 222 08/18/2019 1147   MCV 88.1 06/05/2023 0833   MCV 89 08/18/2019 1147   MCV 85 11/10/2013 0935   MCV 76.3 (L) 09/07/2013 1618   MCH 29.0 05/01/2022 1402   MCHC 32.4 06/05/2023 0833   RDW 13.6 06/05/2023 0833   RDW 11.9 08/18/2019 1147   RDW 16.9 (H) 11/10/2013 0935   RDW 14.4 09/07/2013 1618   Iron/TIBC/Ferritin/ %Sat    Component Value Date/Time   IRON 63 11/10/2013 0935   TIBC 230 (L) 11/10/2013 0935   FERRITIN 63 11/10/2013 0935   IRONPCTSAT 27 11/10/2013 0935   Lipid Panel     Component Value Date/Time    CHOL 182 08/10/2024 1556   CHOL 162 08/18/2019 1147   TRIG 141.0 08/10/2024 1556   HDL 47.20 08/10/2024 1556   HDL 50 08/18/2019 1147   CHOLHDL 4 08/10/2024 1556   VLDL 28.2 08/10/2024 1556   LDLCALC 107 (H) 08/10/2024 1556   LDLCALC 126 (H) 05/01/2022 1402   Hepatic Function Panel     Component Value Date/Time   PROT 7.0 06/05/2023 0833   PROT 6.9 08/18/2019 1147   ALBUMIN 3.9 06/05/2023 0833   ALBUMIN 4.3 08/18/2019 1147   AST 15 06/05/2023 0833   ALT 11 06/05/2023 0833   ALKPHOS 62 06/05/2023 0833   BILITOT 0.3 06/05/2023 0833   BILITOT 0.4 08/18/2019 1147   BILIDIR 0.1 06/30/2008 1101      Component Value Date/Time   TSH  0.77 08/10/2024 1556    Medications  Outpatient Encounter Medications as of 11/03/2024  Medication Sig   certolizumab pegol  (CIMZIA , 2 SYRINGE,) 200 MG/ML prefilled syringe Inject 200mg  subcutaneously every other week.   certolizumab pegol  (CIMZIA -STARTER) 200 MG/ML prefilled syringe Inject 400mg  (2 syringes) subcutaneously at week 0, 2, and 4 for loading dose   estradiol  (VIVELLE -DOT) 0.05 MG/24HR patch Place 1 patch (0.05 mg total) onto the skin 2 (two) times a week.   levothyroxine  (SYNTHROID ) 175 MCG tablet Take 1 tablet (175 mcg total) by mouth daily.   omeprazole  (PRILOSEC) 40 MG capsule Take 1 capsule (40 mg total) by mouth in the morning 30 minutes before meal   UNABLE TO FIND 200mg  Subcutaneous every other week; Duration: 28 days week 0, 2, 4 for loading doses, 28 days,   No facility-administered encounter medications on file as of 11/03/2024.     Assessment and Plan   Elevated LDL cholesterol level Loss of 10-15% body weight can improve lipid levels Will focus on decreasing saturated fats , increasing activity and weight loss  Hashimoto's thyroiditis       Continue to follow with endocrinology       Continue Levothyroxine  175 mcg every day  Vitamin D  deficiency       Currently on no supplementation       Will review most recent lab  work when she brings to office tomorrow  Non-radiographic axial spondyloarthritis (HCC)       Continue to follow with orthopedics       Continue Cimzia  as directed       Increase activity as tolerated  Class 1 obesity with serious comorbidity and body mass index (BMI) of 31.0 to 31.9 in adult, unspecified obesity type Will review all her previous labwork, testing, EKG and determine if any further testing is required at her first visit.   Obesity Treatment and Action Plan:  Patient will work on garnering support from family and friends to begin weight loss journey. Will work on eliminating or reducing the presence of highly palatable, calorie dense foods in the home. Will complete provided nutritional and psychosocial assessment questionnaire before the next appointment. Will be scheduled for indirect calorimetry to determine resting energy expenditure in a fasting state.  This will allow us  to create a reduced calorie, high-protein meal plan to promote loss of fat mass while preserving muscle mass. Counseled on the health benefits of losing 5%-15% of total body weight. Was counseled on nutritional approaches to weight loss and benefits of reducing processed foods and consuming plant-based foods and high quality protein as part of nutritional weight management. Was counseled on pharmacotherapy and role as an adjunct in weight management.   Education and Additional resources  She was weighed on the bioimpedance scale and results were discussed and documented in the synopsis.  We discussed obesity as a progressive, chronic disease and the importance of a more detailed evaluation of all the factors contributing to the disease.  We reviewed the basic principles in obesity management.   We discussed the importance of long term lifestyle changes which include nutrition, exercise and behavioral modification as well as the importance of customizing this to her specific health and social  needs.  We reviewed the role of medical interventions including pharmacotherapy and surgical interventions.   We discussed the benefits of reaching a healthier weight to alleviate the symptoms of existing conditions and reduce the risks of the biomechanical, cardiometabolic and psychological effects of obesity.  We reviewed our  program approach and philosophy, which are guided by the four pillars of obesity medicine.  We discussed how to prepare for intake appointment and the importance of fasting and avoidance of stimulants for at least 8 hours prior to indirect calorimetry.  Kayla Salazar appears to be in the action stage of change and reports being ready to initiate intensive lifestyle and behavioral modifications as part of their weight loss journey.  Attestation  Reviewed by clinician on day of visit: allergies, medications, problem list, medical history, surgical history, family history, social history, and previous encounter notes pertinent to obesity diagnosis.  I personally spent a total of 35 minutes in the care of the patient today including preparing to see the patient, getting/reviewing separately obtained history, performing a medically appropriate exam/evaluation, counseling and educating, and documenting clinical information in the EHR.   Toron Bowring ANP-C

## 2024-11-05 ENCOUNTER — Other Ambulatory Visit (HOSPITAL_COMMUNITY): Payer: Self-pay

## 2024-11-05 ENCOUNTER — Other Ambulatory Visit: Payer: Self-pay | Admitting: Pharmacy Technician

## 2024-11-05 ENCOUNTER — Telehealth: Admitting: Physician Assistant

## 2024-11-05 ENCOUNTER — Other Ambulatory Visit: Payer: Self-pay

## 2024-11-05 ENCOUNTER — Encounter (INDEPENDENT_AMBULATORY_CARE_PROVIDER_SITE_OTHER): Payer: Self-pay

## 2024-11-05 DIAGNOSIS — J069 Acute upper respiratory infection, unspecified: Secondary | ICD-10-CM

## 2024-11-05 DIAGNOSIS — H6502 Acute serous otitis media, left ear: Secondary | ICD-10-CM | POA: Diagnosis not present

## 2024-11-05 MED ORDER — CIPROFLOXACIN-DEXAMETHASONE 0.3-0.1 % OT SUSP
4.0000 [drp] | Freq: Two times a day (BID) | OTIC | 0 refills | Status: DC
  Filled 2024-11-05: qty 7.5, 19d supply, fill #0

## 2024-11-05 MED ORDER — PSEUDOEPH-BROMPHEN-DM 30-2-10 MG/5ML PO SYRP
5.0000 mL | ORAL_SOLUTION | Freq: Four times a day (QID) | ORAL | 0 refills | Status: DC | PRN
  Filled 2024-11-05: qty 120, 6d supply, fill #0

## 2024-11-05 MED ORDER — FLUTICASONE PROPIONATE 50 MCG/ACT NA SUSP
2.0000 | Freq: Every day | NASAL | 0 refills | Status: DC
  Filled 2024-11-05: qty 16, 30d supply, fill #0

## 2024-11-05 NOTE — Progress Notes (Signed)

## 2024-11-05 NOTE — Progress Notes (Signed)
 Specialty Pharmacy Refill Coordination Note  Kayla Salazar is a 48 y.o. female contacted today regarding refills of specialty medication(s) Cimzia  (2 Syringe))   Patient requested (Patient-Rptd) Pickup at [facility] date: (Patient-Rptd) 11/10/24   Medication will be filled on: 11/09/2024

## 2024-11-06 ENCOUNTER — Other Ambulatory Visit (HOSPITAL_BASED_OUTPATIENT_CLINIC_OR_DEPARTMENT_OTHER): Payer: Self-pay

## 2024-11-06 ENCOUNTER — Other Ambulatory Visit (HOSPITAL_COMMUNITY): Payer: Self-pay

## 2024-11-06 DIAGNOSIS — E669 Obesity, unspecified: Secondary | ICD-10-CM | POA: Diagnosis not present

## 2024-11-06 DIAGNOSIS — Z6830 Body mass index (BMI) 30.0-30.9, adult: Secondary | ICD-10-CM | POA: Diagnosis not present

## 2024-11-06 DIAGNOSIS — M45A0 Non-radiographic axial spondyloarthritis of unspecified sites in spine: Secondary | ICD-10-CM | POA: Diagnosis not present

## 2024-11-06 DIAGNOSIS — J069 Acute upper respiratory infection, unspecified: Secondary | ICD-10-CM | POA: Diagnosis not present

## 2024-11-06 DIAGNOSIS — M359 Systemic involvement of connective tissue, unspecified: Secondary | ICD-10-CM | POA: Diagnosis not present

## 2024-11-06 DIAGNOSIS — Z111 Encounter for screening for respiratory tuberculosis: Secondary | ICD-10-CM | POA: Diagnosis not present

## 2024-11-06 DIAGNOSIS — M2559 Pain in other specified joint: Secondary | ICD-10-CM | POA: Diagnosis not present

## 2024-11-06 DIAGNOSIS — R5383 Other fatigue: Secondary | ICD-10-CM | POA: Diagnosis not present

## 2024-11-06 MED ORDER — AZITHROMYCIN 250 MG PO TABS
ORAL_TABLET | ORAL | 0 refills | Status: DC
  Filled 2024-11-06: qty 6, 5d supply, fill #0

## 2024-11-09 ENCOUNTER — Other Ambulatory Visit: Payer: Self-pay

## 2024-11-12 ENCOUNTER — Other Ambulatory Visit: Payer: Self-pay

## 2024-11-12 ENCOUNTER — Ambulatory Visit (INDEPENDENT_AMBULATORY_CARE_PROVIDER_SITE_OTHER): Admitting: Internal Medicine

## 2024-11-12 ENCOUNTER — Ambulatory Visit (INDEPENDENT_AMBULATORY_CARE_PROVIDER_SITE_OTHER): Admitting: Nurse Practitioner

## 2024-11-12 ENCOUNTER — Encounter (INDEPENDENT_AMBULATORY_CARE_PROVIDER_SITE_OTHER): Payer: Self-pay | Admitting: Internal Medicine

## 2024-11-12 VITALS — BP 123/81 | HR 62 | Temp 97.8°F | Ht 66.5 in | Wt 196.0 lb

## 2024-11-12 DIAGNOSIS — E611 Iron deficiency: Secondary | ICD-10-CM | POA: Diagnosis not present

## 2024-11-12 DIAGNOSIS — E7800 Pure hypercholesterolemia, unspecified: Secondary | ICD-10-CM | POA: Insufficient documentation

## 2024-11-12 DIAGNOSIS — Z6831 Body mass index (BMI) 31.0-31.9, adult: Secondary | ICD-10-CM | POA: Insufficient documentation

## 2024-11-12 DIAGNOSIS — K760 Fatty (change of) liver, not elsewhere classified: Secondary | ICD-10-CM | POA: Diagnosis not present

## 2024-11-12 DIAGNOSIS — R0602 Shortness of breath: Secondary | ICD-10-CM | POA: Diagnosis not present

## 2024-11-12 DIAGNOSIS — E66811 Obesity, class 1: Secondary | ICD-10-CM | POA: Diagnosis not present

## 2024-11-12 DIAGNOSIS — E559 Vitamin D deficiency, unspecified: Secondary | ICD-10-CM | POA: Insufficient documentation

## 2024-11-12 DIAGNOSIS — R29818 Other symptoms and signs involving the nervous system: Secondary | ICD-10-CM | POA: Insufficient documentation

## 2024-11-12 DIAGNOSIS — Z1331 Encounter for screening for depression: Secondary | ICD-10-CM

## 2024-11-12 DIAGNOSIS — R5383 Other fatigue: Secondary | ICD-10-CM

## 2024-11-12 NOTE — Assessment & Plan Note (Signed)
 Most recent vitamin D  levels  Lab Results  Component Value Date   VD25OH 32 05/01/2022   VD25OH 34 03/23/2021   VD25OH 29.7 (L) 08/18/2019     Deficiency state associated with adiposity and may result in leptin resistance, weight gain and fatigue. Currently on vitamin D  supplementation without any adverse effects.  Plan: Repeat vitamin D  levels at 6 months

## 2024-11-12 NOTE — Assessment & Plan Note (Signed)
 [name] admits to daytime somnolence and admits to waking up still tired. Patient has a history of symptoms of morning fatigue and morning headache. Kayla Salazar generally gets 6 or 7 hours of sleep per night, and states that she does not sleep well most nights. Snoring is present. Apneic episodes are present. Epworth Sleepiness Score is 8.  Recommend further screening at the next office visit and referral for polysomnography

## 2024-11-12 NOTE — Assessment & Plan Note (Signed)
 Elevated LDL cholesterol at 128 mg/dL. Family history of cardiovascular disease. Previous CT scan showed no plaque in the heart. Discussed the need for further cardiovascular risk assessment and potential genetic factors contributing to lipid levels.  Hormone replacement may have an effect on lipids. - Will repeat cholesterol levels in 6 months. - Will consider high sensitivity CRP and LPA testing after lifestyle changes.

## 2024-11-12 NOTE — Assessment & Plan Note (Signed)
 Low ferritin levels indicating iron deficiency. Long-standing anemia requiring iron infusions. Discussed the potential link between anemia and autoimmune conditions like Hashimoto's. - Discuss celiac disease testing with PCP due to autoimmune condition.

## 2024-11-12 NOTE — Assessment & Plan Note (Signed)
 Body fat percentage of 41%, above the desirable range of 23-34% for her age, height, and weight. Basal metabolic rate is within normal limits. Weight gain since her 30s, possibly related to hormonal changes post-menopause. No insulin resistance or glucose metabolic disorder based on review from labs from [facility]. Family history of cardiovascular issues. Discussed the importance of a calorie deficit for weight loss, emphasizing a 500-calorie deficit for women to achieve approximately 1 pound of weight loss per week. Highlighted the role of protein as an appetite suppressant and the benefits of a whole foods diet.  - Implement a calorie deficit diet targeting 1000 calories per day. - Prioritize protein intake with 30 grams per meal. - Incorporate whole foods and reduce processed foods. - Consider meal replacements for convenience. - Track and journal food intake to calibrate calorie consumption. - Discuss potential use of GLP-1 medications if lifestyle changes are insufficient.

## 2024-11-12 NOTE — Progress Notes (Signed)
 [address] [place],  [place], [place] [address]  Office: [phone]  /  Fax: [phone]   Subjective   Initial Visit  Kayla Salazar (MR# [id number]) is a 48 y.o. female who presents for evaluation and treatment of obesity and related comorbidities. Current BMI is Body mass index is 31.16 kg/m. Kayla Salazar has been struggling with her weight for many years and has been unsuccessful in either losing weight, maintaining weight loss, or reaching her healthy weight goal.  Kayla Salazar is currently in the action stage of change and ready to dedicate time achieving and maintaining a healthier weight. Kayla Salazar is interested in becoming our patient and working on intensive lifestyle modifications including (but not limited to) diet and exercise for weight loss.  Weight history:  History of Present Illness Kayla Salazar is a 48 year old female who presents for an intake appointment for medical weight management.  Kayla Salazar has experienced weight gain since her thirties, which Kayla Salazar attributes to a hysterectomy performed due to heavy bleeding, although her ovaries were not removed. Hormone deficiencies have been managed with estrogen, addressing symptoms such as hot flashes, waking, and irritability. Weight gain has been more pronounced since menopause. Her body fat percentage is 41%, and her basal metabolic rate is 8469, as determined by indirect calorimetry. Kayla Salazar has a history of emotional hunger, eating when stressed or as a reward, and occasionally skips breakfast due to time constraints.  Her fasting blood glucose is 86, and her A1c is 5.2. However, Kayla Salazar has an elevated LDL cholesterol level of 128. Her family history is significant for cardiovascular issues; her sister had a heart attack at age 71, and her grandmother had significant heart problems. Kayla Salazar underwent a CT scan to measure plaque in her heart, which showed no plaque.  Kayla Salazar has a history of Hashimoto's thyroiditis and ankylosing spondylitis, for which Kayla Salazar is  taking Cimzia , a biologic medication. Kayla Salazar also has a history of stomach ulcers caused by anti-inflammatory use, for which Kayla Salazar was prescribed omeprazole , now taken as needed. Kayla Salazar has struggled with anemia for most of her life, requiring iron infusions, and is currently taking vitamin D  supplements for a deficiency. Kayla Salazar has not been tested for celiac disease.  Kayla Salazar has a history of fatty liver, identified during an ultrasound for stomach ulcers. Her social history includes working in the [employer] department for almost 30 years. Kayla Salazar is married, and her husband also has weight issues. Kayla Salazar is not currently engaging in regular exercise but remains active throughout the day. Kayla Salazar consumes coffee with creamer, rice sodas twice a week, and sweet tea twice a week. Kayla Salazar has not had a sleep study despite experiencing symptoms suggestive of sleep apnea, such as waking up gasping for air and her husband noting that Kayla Salazar stops breathing during sleep.    Nutritional History:  Current nutrition plan: Portion control / smart choices.  How many times do you eat outside the home: 1-2 per week  How often do they skip meals: Sometimes  What beverages do they drink: water and caffeinated beverages .   Use of artificial sweetners : Yes  Food intolerances or dislikes: none.  Food triggers: Stress, Seeking reward, To help comfort self, and When Sad.  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  IVP done in 2012.   Fatty liver    Gastric ulcer    GERD (gastroesophageal reflux disease)    History of COVID-19 12/2020   Iron deficiency anemia    hx  iron infusions with Dr. Timmy   Joint pain    Swallowing difficulty    Thyroid  disease    hypothyroid     Objective   BP 123/81   Pulse 62   Temp 97.8 F (36.6 C)   Ht 5' 6.5 (1.689 m)   Wt 196 lb (88.9 kg)   LMP 06/22/2011   SpO2 97%   BMI 31.16 kg/m  Kayla Salazar was weighed on the bioimpedance scale: Body mass index is 31.16 kg/m.    Anthropometrics:  Vitals Temp: 97.8 F (36.6 C) BP: 123/81 Pulse Rate: 62 SpO2: 97 %   Anthropometric Measurements Height: 5' 6.5 (1.689 m) Weight: 196 lb (88.9 kg) BMI (Calculated): 31.16 Weight Gained Since Last Visit: 196 lb Peak Weight: 200 lb Waist Measurement : 41 inches   Body Composition  Body Fat %: 41.7 % Fat Mass (lbs): 82 lbs Muscle Mass (lbs): 109 lbs Total Body Water (lbs): 79 lbs Visceral Fat Rating : 10   Other Clinical Data Fasting: yes Labs: yes Today's Visit #: 1 Starting Date: 11/12/24    Physical Exam:  General: Kayla Salazar is overweight, cooperative, alert, well developed, and in no acute distress. PSYCH: Has normal mood, affect and thought process.   HEENT: EOMI, sclerae are anicteric. Lungs: Normal breathing effort, no conversational dyspnea. Extremities: No edema.  Neurologic: No gross sensory or motor deficits. No tremors or fasciculations noted.    Diagnostic Data Reviewed  EKG: Normal sinus rhythm, rate 58 bpm. No conduction abnormalities, abnormal Q waves or chamber enlargement.  Indirect Calorimeter: reviewed IC completed at [facility] it was 1530.  Her calculated basal metabolic rate is 8408 thus her resting energy expenditure same as calculated.  Depression Screen  Trinaty's PHQ-9 score was: 18.     11/12/2024    8:00 AM  Depression screen PHQ 2/9  Decreased Interest 3  Down, Depressed, Hopeless 2  PHQ - 2 Score 5  Altered sleeping 3  Tired, decreased energy 3  Change in appetite 3  Feeling bad or failure about yourself  2  Trouble concentrating 2  Moving slowly or fidgety/restless  0  Suicidal thoughts 0  PHQ-9 Score 18    Screening for Sleep Related Breathing Disorders  Anda admits to daytime somnolence and admits to waking up still tired. Patient has a history of symptoms of morning fatigue and morning headache. Kayla Salazar generally gets 6 or 7 hours of sleep per night, and states that Kayla Salazar does not sleep well most nights. Snoring is present. Apneic episodes are present. Epworth Sleepiness Score is 8.   BMET    Component Value Date/Time   NA 142 06/05/2023 0833   NA 138 08/18/2019 1147   K 4.7 06/05/2023 0833   CL 106 06/05/2023 0833   CO2 23 06/05/2023 0833   GLUCOSE 90 06/05/2023 0833   BUN 11 06/05/2023 0833   BUN 8 08/18/2019 1147   CREATININE 0.66 06/05/2023 0833   CREATININE 0.57 05/01/2022 1402   CALCIUM 9.0 06/05/2023 0833   GFRNONAA 112 08/18/2019 1147   GFRAA 130 08/18/2019 1147   No results found for: HGBA1C No results  found for: INSULIN CBC    Component Value Date/Time   WBC 7.1 06/05/2023 0833   RBC 4.21 06/05/2023 0833   HGB 12.0 06/05/2023 0833   HGB 12.1 08/18/2019 1147   HGB 11.6 (L) 10/15/2016 0954   HGB 11.7 11/10/2013 0935   HGB 9.3 (L) 09/07/2013 1618   HCT 37.1 06/05/2023 0833   HCT 37.5 08/18/2019 1147   HCT 36.2 11/10/2013 0935   HCT 28.9 (L) 09/07/2013 1618   PLT 236.0 06/05/2023 0833   PLT 222 08/18/2019 1147   MCV 88.1 06/05/2023 0833   MCV 89 08/18/2019 1147   MCV 85 11/10/2013 0935   MCV 76.3 (L) 09/07/2013 1618   MCH 29.0 05/01/2022 1402   MCHC 32.4 06/05/2023 0833   RDW 13.6 06/05/2023 0833   RDW 11.9 08/18/2019 1147   RDW 16.9 (H) 11/10/2013 0935   RDW 14.4 09/07/2013 1618   Iron/TIBC/Ferritin/ %Sat    Component Value Date/Time   IRON 63 11/10/2013 0935   TIBC 230 (L) 11/10/2013 0935   FERRITIN 63 11/10/2013 0935   IRONPCTSAT 27 11/10/2013 0935   Lipid Panel     Component Value Date/Time   CHOL 182 08/10/2024 1556   CHOL 162 08/18/2019 1147   TRIG 141.0 08/10/2024 1556   HDL 47.20 08/10/2024  1556   HDL 50 08/18/2019 1147   CHOLHDL 4 08/10/2024 1556   VLDL 28.2 08/10/2024 1556   LDLCALC 107 (H) 08/10/2024 1556   LDLCALC 126 (H) 05/01/2022 1402   Hepatic Function Panel     Component Value Date/Time   PROT 7.0 06/05/2023 0833   PROT 6.9 08/18/2019 1147   ALBUMIN 3.9 06/05/2023 0833   ALBUMIN 4.3 08/18/2019 1147   AST 15 06/05/2023 0833   ALT 11 06/05/2023 0833   ALKPHOS 62 06/05/2023 0833   BILITOT 0.3 06/05/2023 0833   BILITOT 0.4 08/18/2019 1147   BILIDIR 0.1 06/30/2008 1101      Component Value Date/Time   TSH 0.77 08/10/2024 1556     Assessment and Plan   TREATMENT PLAN FOR OBESITY:  Recommended Dietary Goals  Lauraann is currently in the action stage of change. As such, her goal is to implement medically supervised obesity management plan.  Kayla Salazar has agreed to implement: the Category 1 plan - 1000 kcal per day  Behavioral Intervention  We discussed the following Behavioral Modification Strategies today: increasing lean protein intake to established goals, decreasing simple carbohydrates , increasing vegetables, increasing lower glycemic fruits, increasing fiber rich foods, avoiding skipping meals, increasing water intake, work on meal planning and preparation, work on tracking and journaling calories using tracking application, reading food labels , keeping healthy foods at home, identifying sources and decreasing liquid calories, decreasing eating out or consumption of processed foods, and making healthy choices when eating convenient foods, planning for success, and better snacking choices  Additional resources provided today: Handout on healthy eating and balanced plate, Handout on complex carbohydrates and lean sources of protein, Category 1 packet, and Handout principles of weight management  Recommended Physical Activity Goals  Meztli has been advised to work up to 150 minutes of moderate intensity aerobic activity a week and strengthening exercises 2-3  times per week for cardiovascular health, weight loss maintenance and preservation of muscle mass.   Kayla Salazar has agreed to :  Think about enjoyable ways to increase daily physical activity and overcoming barriers to exercise, Increase physical activity in their day and reduce sedentary time (increase NEAT)., Increase volume of physical activity to  a goal of 240 minutes a week, and Combine aerobic and strengthening exercises for efficiency and improved cardiometabolic health.  Medical Interventions and Pharmacotherapy We will work on building a therapist, art and behavioral strategies. We will discuss the role of pharmacotherapy as an adjunct at subsequent visits.   ASSOCIATED CONDITIONS ADDRESSED TODAY  Other Fatigue Cayce admits to daytime somnolence and admits to waking up still tired. Patient has a history of symptoms of morning fatigue and morning headache. Elsbeth generally gets 6 or 7 hours of sleep per night, and states that Kayla Salazar does not sleep well most nights. Snoring is present. Apneic episodes are present. Epworth Sleepiness Score is 8. Kayla Salazar does feel that her weight is causing her energy to be lower than it should be. Fatigue may be related to obesity, depression or many other causes. Labs will be ordered, and in the meanwhile, Kayla Salazar will focus on self care including making healthy food choices, increasing physical activity and focusing on stress reduction.  Shortness of Breath Kayla Salazar notes increasing shortness of breath with physical activity and seems to be worsening over time with weight gain. Kayla Salazar notes getting out of breath sooner with activity than Kayla Salazar used to. This has not gotten worse recently. Roise denies shortness of breath at rest or orthopnea.  Assessment & Plan SOB (shortness of breath) on exertion  Depression screen  Other fatigue  Iron deficiency Low ferritin levels indicating iron deficiency. Long-standing anemia requiring iron infusions. Discussed  the potential link between anemia and autoimmune conditions like Hashimoto's. - Discuss celiac disease testing with PCP due to autoimmune condition. Vitamin D  deficiency Most recent vitamin D  levels  Lab Results  Component Value Date   VD25OH 32 05/01/2022   VD25OH 34 03/23/2021   VD25OH 29.7 (L) 08/18/2019     Deficiency state associated with adiposity and may result in leptin resistance, weight gain and fatigue. Currently on vitamin D  supplementation without any adverse effects.  Plan: Repeat vitamin D  levels at 6 months  Class 1 obesity with serious comorbidity and body mass index (BMI) of 31.0 to 31.9 in adult, unspecified obesity type Body fat percentage of 41%, above the desirable range of 23-34% for her age, height, and weight. Basal metabolic rate is within normal limits. Weight gain since her 30s, possibly related to hormonal changes post-menopause. No insulin resistance or glucose metabolic disorder based on review from labs from [facility]. Family history of cardiovascular issues. Discussed the importance of a calorie deficit for weight loss, emphasizing a 500-calorie deficit for women to achieve approximately 1 pound of weight loss per week. Highlighted the role of protein as an appetite suppressant and the benefits of a whole foods diet.  - Implement a calorie deficit diet targeting 1000 calories per day. - Prioritize protein intake with 30 grams per meal. - Incorporate whole foods and reduce processed foods. - Consider meal replacements for convenience. - Track and journal food intake to calibrate calorie consumption. - Discuss potential use of GLP-1 medications if lifestyle changes are insufficient. Hypercholesterolemia Elevated LDL cholesterol at 128 mg/dL. Family history of cardiovascular disease. Previous CT scan showed no plaque in the heart. Discussed the need for further cardiovascular risk assessment and potential genetic factors contributing to lipid levels.  Hormone  replacement may have an effect on lipids. - Will repeat cholesterol levels in 6 months. - Will consider high sensitivity CRP and LPA testing after lifestyle changes. Suspected sleep apnea Ketura admits to daytime somnolence and admits to waking up still  tired. Patient has a history of symptoms of morning fatigue and morning headache. Kayla Salazar generally gets 6 or 7 hours of sleep per night, and states that Kayla Salazar does not sleep well most nights. Snoring is present. Apneic episodes are present. Epworth Sleepiness Score is 8.  Recommend further screening at the next office visit and referral for polysomnography Metabolic dysfunction-associated steatotic liver disease (MASLD) I reviewed labs from [facility] Kayla Salazar has normal platelet count, and liver enzymes.  Kayla Salazar is not on CF agenic drugs.  We discussed reducing simple and added sugars in diet as well as saturated fats.  Losing 15 % of body weight may improve condition.  Kayla Salazar may also benefit from treatment with GLP-1.  Recording duration 45 minutes   Follow-up  Kayla Salazar was informed of the importance of frequent follow-up visits to maximize her success with intensive lifestyle modifications for her multiple health conditions. Kayla Salazar was informed we would discuss her lab results at her next visit unless there is a critical issue that needs to be addressed sooner. Kayla Salazar agreed to keep her next visit at the agreed upon time to discuss these results.  Attestation Statement  This is the patient's intake visit at [facility]. The patient's Health Questionnaire was reviewed at length. Included in the packet: current and past health history, medications, allergies, ROS, gynecologic history (women only), surgical history, family history, social history, weight history, weight loss surgery history (for those that have had weight loss surgery), nutritional evaluation, mood and food questionnaire, PHQ9, Epworth questionnaire, sleep habits questionnaire, patient life  and health improvement goals questionnaire. These will all be scanned into the patient's chart under media.   During the visit, I independently reviewed the patient's previous labs done at [facility], bioimpedance scale results, and indirect calorimetry results. I used this information to medically tailor a meal plan for the patient that will help her to lose weight and will improve her obesity-related conditions. I performed a medically necessary appropriate examination and/or evaluation. I discussed the assessment and treatment plan with the patient. The patient was provided an opportunity to ask questions and all were answered. The patient agreed with the plan and demonstrated an understanding of the instructions. Labs were ordered at this visit and will be reviewed at the next visit unless critical results need to be addressed immediately. Clinical information was updated and documented in the EMR.   In addition, they received basic education on identification of processed foods and reduction of these, different sources of lean proteins and complex carbohydrates and how to eat balanced by incorporation of whole foods.  Reviewed by clinician on day of visit: allergies, medications, problem list, medical history, surgical history, family history, social history, and previous encounter notes.  I have spent 57 minutes in the care of the patient today including: 2 minutes before the visit reviewing and preparing the chart. 45 minutes face-to-face assessing and reviewing listed medical problems as outlined in obesity care plan, providing nutritional and behavioral counseling on topics outlined in the obesity care plan, counseling regarding anti-obesity medication as outlined in obesity care plan, independently interpreting test results and goals of care, as described in assessment and plan, reviewing and discussing biometric information and progress, and reviewing latest PCP notes and specialist consultations.   I reviewed records from [facility] including test results, EKG and indirect calorimetry 10 minutes after the visit updating chart and documentation of encounter.       Lucas Parker, MD

## 2024-11-13 ENCOUNTER — Other Ambulatory Visit (HOSPITAL_COMMUNITY): Payer: Self-pay

## 2024-11-16 ENCOUNTER — Other Ambulatory Visit (HOSPITAL_COMMUNITY): Payer: Self-pay

## 2024-11-16 ENCOUNTER — Other Ambulatory Visit: Payer: Self-pay

## 2024-11-23 ENCOUNTER — Encounter (INDEPENDENT_AMBULATORY_CARE_PROVIDER_SITE_OTHER): Payer: Self-pay | Admitting: Nurse Practitioner

## 2024-11-23 ENCOUNTER — Ambulatory Visit (INDEPENDENT_AMBULATORY_CARE_PROVIDER_SITE_OTHER): Admitting: Nurse Practitioner

## 2024-11-23 VITALS — BP 112/74 | HR 69 | Temp 98.7°F | Ht 66.5 in | Wt 200.0 lb

## 2024-11-23 DIAGNOSIS — E66811 Obesity, class 1: Secondary | ICD-10-CM | POA: Diagnosis not present

## 2024-11-23 DIAGNOSIS — Z6831 Body mass index (BMI) 31.0-31.9, adult: Secondary | ICD-10-CM

## 2024-11-23 DIAGNOSIS — K760 Fatty (change of) liver, not elsewhere classified: Secondary | ICD-10-CM | POA: Diagnosis not present

## 2024-11-23 DIAGNOSIS — E559 Vitamin D deficiency, unspecified: Secondary | ICD-10-CM | POA: Diagnosis not present

## 2024-11-23 NOTE — Progress Notes (Signed)
 Office: [phone]  /  Fax: [phone]  WEIGHT SUMMARY AND BIOMETRICS  Weight Lost Since Last Visit: 0  Weight Gained Since Last Visit: 4 lb   Vitals Temp: 98.7 F (37.1 C) BP: 112/74 Pulse Rate: 69 SpO2: 97 %   Anthropometric Measurements Height: 5' 6.5 (1.689 m) Weight: 200 lb (90.7 kg) BMI (Calculated): 31.8 Weight at Last Visit: 196 lb Weight Lost Since Last Visit: 0 Weight Gained Since Last Visit: 4 lb Total Weight Loss (lbs): 0 lb (0 kg) Peak Weight: 200 lb Waist Measurement : 41 inches   Body Composition  Body Fat %: 42.8 % Fat Mass (lbs): 85.8 lbs Muscle Mass (lbs): 108.88 lbs Total Body Water (lbs): 82 lbs Visceral Fat Rating : 10   Other Clinical Data Fasting: no Labs: no Today's Visit #: 2 Starting Date: 11/12/24    Total Weight Loss:0 pounds  Bio Impedance Data reviewed with patient: muscle is down 0.12 pounds, adipose is up 3.8 pounds.  HPI  Chief Complaint: OBESITY  Kayla Salazar is here to discuss her progress with her obesity treatment plan. She is on the the Category 1 Plan and states she is following her eating plan approximately 25 % of the time. She states she is exercising 20-30 minutes 3 days per week.   Interval History:  Since last office visit she was out of town to get Christmas tree.  Has not been following the meal plan in the past 2 weeks.  Drinking at least 90 ounces of water. She has been meeting protein goals but has not reduced carbs.  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 People will be bringing food and she will also be doing cooking.   She does have a history of MASLD- trying to limit simple carbohydrates and saturated fats.   She does have Vit D deficiency and is currently on D3K2 once a day- plan is to recheck level in 6 months.    PHYSICAL EXAM:  Blood pressure 112/74, pulse 69, temperature 98.7  F (37.1 C), height 5' 6.5 (1.689 m), weight 200 lb (90.7 kg), last menstrual period 06/22/2011, SpO2 97%. Body mass index is 31.8 kg/m.  General: Well Developed, well nourished, and in no acute distress.  HEENT: Normocephalic, atraumatic; EOMI, sclerae are anicteric. Skin: Warm and dry, good turgor Chest:  Normal excursion, shape, no gross ABN Respiratory: No conversational dyspnea; speaking in full sentences NeuroM-Sk:  Normal gross ROM * 4 extremities  Psych: A and O X 3, insight adequate, mood- full    DIAGNOSTIC DATA REVIEWED:  BMET    Component Value Date/Time   NA 142 06/05/2023 0833   NA 138 08/18/2019 1147   K 4.7 06/05/2023 0833   CL 106 06/05/2023 0833   CO2 23 06/05/2023 0833   GLUCOSE 90 06/05/2023 0833   BUN 11 06/05/2023 0833   BUN 8 08/18/2019 1147   CREATININE 0.66 06/05/2023 0833   CREATININE 0.57 05/01/2022 1402   CALCIUM 9.0 06/05/2023 0833   GFRNONAA 112 08/18/2019 1147   GFRAA 130 08/18/2019 1147   No results found for: HGBA1C No results found for: INSULIN  Lab Results  Component Value Date   TSH 0.77 08/10/2024   CBC    Component Value Date/Time   WBC 7.1 06/05/2023 0833   RBC 4.21 06/05/2023 0833   HGB 12.0 06/05/2023 0833   HGB 12.1 08/18/2019  1147   HGB 11.6 (L) 10/15/2016 0954   HGB 11.7 11/10/2013 0935   HGB 9.3 (L) 09/07/2013 1618   HCT 37.1 06/05/2023 0833   HCT 37.5 08/18/2019 1147   HCT 36.2 11/10/2013 0935   HCT 28.9 (L) 09/07/2013 1618   PLT 236.0 06/05/2023 0833   PLT 222 08/18/2019 1147   MCV 88.1 06/05/2023 0833   MCV 89 08/18/2019 1147   MCV 85 11/10/2013 0935   MCV 76.3 (L) 09/07/2013 1618   MCH 29.0 05/01/2022 1402   MCHC 32.4 06/05/2023 0833   RDW 13.6 06/05/2023 0833   RDW 11.9 08/18/2019 1147   RDW 16.9 (H) 11/10/2013 0935   RDW 14.4 09/07/2013 1618   Iron Studies    Component Value Date/Time   IRON 63 11/10/2013 0935   TIBC 230 (L) 11/10/2013 0935   FERRITIN 63 11/10/2013 0935   IRONPCTSAT 27  11/10/2013 0935   Lipid Panel     Component Value Date/Time   CHOL 182 08/10/2024 1556   CHOL 162 08/18/2019 1147   TRIG 141.0 08/10/2024 1556   HDL 47.20 08/10/2024 1556   HDL 50 08/18/2019 1147   CHOLHDL 4 08/10/2024 1556   VLDL 28.2 08/10/2024 1556   LDLCALC 107 (H) 08/10/2024 1556   LDLCALC 126 (H) 05/01/2022 1402   Hepatic Function Panel     Component Value Date/Time   PROT 7.0 06/05/2023 0833   PROT 6.9 08/18/2019 1147   ALBUMIN 3.9 06/05/2023 0833   ALBUMIN 4.3 08/18/2019 1147   AST 15 06/05/2023 0833   ALT 11 06/05/2023 0833   ALKPHOS 62 06/05/2023 0833   BILITOT 0.3 06/05/2023 0833   BILITOT 0.4 08/18/2019 1147   BILIDIR 0.1 06/30/2008 1101      Component Value Date/Time   TSH 0.77 08/10/2024 1556   Nutritional Lab Results  Component Value Date   VD25OH 32 05/01/2022   VD25OH 34 03/23/2021   VD25OH 29.7 (L) 08/18/2019     ASSESSMENT AND PLAN  Class 1 obesity with serious comorbidity and body mass index (BMI) of 31.0 to 31.9 in adult, unspecified obesity type TREATMENT PLAN FOR OBESITY:  Recommended Dietary Goals  Kayla Salazar is currently in the action stage of change. As such, her goal is to continue weight management plan. She has agreed to the Category 1 Plan.  Behavioral Intervention  We discussed the following Behavioral Modification Strategies today: increasing lean protein intake to established goals, decreasing simple carbohydrates , increasing vegetables, increasing fiber rich foods, avoiding skipping meals, increasing water intake , work on meal planning and preparation, practice mindfulness eating and understand the difference between hunger signals and cravings, celebration eating strategies- one plate with protein as largest portion, clean vegetable and then portion of anything she wants to try but food cannot touch on the plate, be mindful and enjoy your food  , and continue to work on maintaining a reduced calorie state, getting the recommended  amount of protein, incorporating whole foods, making healthy choices, staying well hydrated and practicing mindfulness when eating..  Additional resources provided today: [employer] - reviewed use of website.  Recommended Physical Activity Goals  Kayla Salazar has been advised to work up to 150 minutes of moderate intensity aerobic activity a week and strengthening exercises 2-3 times per week for cardiovascular health, weight loss maintenance and preservation of muscle mass.   She has agreed to Continue current level of physical activity , Think about enjoyable ways to increase daily physical activity and overcoming barriers to exercise, and Increase  physical activity in their day and reduce sedentary time (increase NEAT).   Pharmacotherapy We discussed various medication options to help Kayla Salazar with her weight loss efforts and we both agreed to continue nutrition and behavior modification.  ASSOCIATED CONDITIONS ADDRESSED TODAY  Action/Plan  Vitamin D  deficiency       Continue to supplement with Vit D3 K2 and will recheck level in 6 months       Vitamin d  supplementation has been shown to decrease fatigue, decrease risk of progression to insulin  resistance and then prediabetes, decreases risk of falling in older age and can even assist in decreasing depressive symptoms in PTSD    Metabolic dysfunction-associated steatotic liver disease (MASLD) Start Category 1 meal plan and focus on limiting saturated fats and simple carbohydrates Loss of 10-15% of body weight can help improve hepatic steatosis            Return in about 3 weeks (around 12/14/2024).Kayla Salazar She was informed of the importance of frequent follow up visits to maximize her success with intensive lifestyle modifications for her multiple health conditions.   ATTESTASTION STATEMENTS:  Reviewed by clinician on day of visit: allergies, medications, problem list, medical history, surgical history, family history, social history,  and previous encounter notes.   I personally spent a total of 35 minutes in the care of the patient today including preparing to see the patient, getting/reviewing separately obtained history, performing a medically appropriate exam/evaluation, counseling and educating, and documenting clinical information in the EHR.   Devan Babino ANP-C

## 2024-11-27 ENCOUNTER — Encounter (INDEPENDENT_AMBULATORY_CARE_PROVIDER_SITE_OTHER): Payer: Self-pay

## 2024-12-04 ENCOUNTER — Other Ambulatory Visit: Payer: Self-pay

## 2024-12-04 ENCOUNTER — Other Ambulatory Visit: Payer: Self-pay | Admitting: Pharmacy Technician

## 2024-12-04 NOTE — Progress Notes (Signed)
 Specialty Pharmacy Refill Coordination Note  Kayla Salazar is a 48 y.o. female contacted today regarding refills of specialty medication(s)   Certolizumab Pegol  (Cimzia -Starter, Cimzia  (2 Syringe))    Patient requested (Patient-Rptd) Pickup at [facility] at [facility] date: (Patient-Rptd) 12/11/24   Medication will be filled on: 12/10/2024

## 2024-12-10 ENCOUNTER — Other Ambulatory Visit: Payer: Self-pay

## 2024-12-14 ENCOUNTER — Ambulatory Visit (INDEPENDENT_AMBULATORY_CARE_PROVIDER_SITE_OTHER): Admitting: Nurse Practitioner

## 2024-12-29 ENCOUNTER — Encounter (INDEPENDENT_AMBULATORY_CARE_PROVIDER_SITE_OTHER): Payer: Self-pay | Admitting: Physician Assistant

## 2024-12-29 ENCOUNTER — Ambulatory Visit (INDEPENDENT_AMBULATORY_CARE_PROVIDER_SITE_OTHER): Admitting: Physician Assistant

## 2024-12-29 VITALS — BP 128/84 | HR 57 | Ht 67.0 in | Wt 200.0 lb

## 2024-12-29 DIAGNOSIS — M26622 Arthralgia of left temporomandibular joint: Secondary | ICD-10-CM | POA: Diagnosis not present

## 2024-12-29 DIAGNOSIS — H9202 Otalgia, left ear: Secondary | ICD-10-CM | POA: Diagnosis not present

## 2024-12-29 DIAGNOSIS — H9192 Unspecified hearing loss, left ear: Secondary | ICD-10-CM

## 2024-12-30 ENCOUNTER — Ambulatory Visit (HOSPITAL_COMMUNITY)
Admission: RE | Admit: 2024-12-30 | Discharge: 2024-12-30 | Disposition: A | Discharge status: Home / Self Care | Source: Ambulatory Visit | Attending: Physician Assistant | Admitting: Physician Assistant

## 2024-12-30 ENCOUNTER — Encounter (INDEPENDENT_AMBULATORY_CARE_PROVIDER_SITE_OTHER): Payer: Self-pay

## 2024-12-30 ENCOUNTER — Other Ambulatory Visit: Payer: Self-pay

## 2024-12-30 DIAGNOSIS — H9202 Otalgia, left ear: Secondary | ICD-10-CM | POA: Diagnosis present

## 2024-12-30 MED ORDER — GADOBUTROL 1 MMOL/ML IV SOLN
10.0000 mL | Freq: Once | INTRAVENOUS | Status: AC | PRN
  Administered 2024-12-30: 10 mL via INTRAVENOUS

## 2024-12-30 NOTE — Progress Notes (Signed)
 Dear Dr. Melba, Here is my assessment for our mutual patient, Kayla Salazar. Thank you for allowing me the opportunity to care for your patient. Please do not hesitate to contact me should you have any other questions. Sincerely, Chyrl Cohen PA-C  Otolaryngology Clinic Note Referring provider: Dr. Melba HPI:  Kayla Salazar is a 48 y.o. female kindly referred by Dr. Melba   Discussed the use of AI scribe software for clinical note transcription with the patient, who gave verbal consent to proceed.  History of Present Illness    Kayla Salazar is a 48 year old female with TMJ disorder who presents with left otalgia, hearing loss, and tinnitus following TMJ injections.  She developed significant ear pain and aural fullness several months after receiving four to six PRP-type injections into the TMJ joint earlier this year. The pain is described as tremendous fullness in the ear, radiating into the neck and face, and is associated with intermittent but increasingly persistent muffled hearing and tinnitus, including occasional pulsatile tinnitus. Symptoms have progressively worsened since onset. She denies prior ear or facial pain radiating to the neck before the injections.  Prior to the injections, she experienced TMJ symptoms including jaw clicking, tightness, soreness, and trismus, but did not have otalgia. She had intermittent tinnitus associated with TMJ, but not at the current severity or with associated otalgia. She was fitted with a mouth guard and underwent multiple injections, but neither intervention provided relief for her TMJ symptoms.  She denies otorrhea, nasal congestion, recent upper respiratory infection, trauma to the ear aside from the injections, and seasonal allergies. She describes a sensation of impaired drainage and fullness on the affected side of the neck. Left-sided hearing is intermittently muffled, with occasional improvement, but this is a new symptom since the  injections. The right ear is unaffected.  She has a history of recurrent otitis media in childhood requiring multiple tympanostomy tubes, but no adult ear infections. She recalls a probable nasal fracture earlier in the year but reports no current nasal obstruction.          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   Fatty liver    Gastric ulcer    GERD (gastroesophageal reflux disease)    History of COVID-19 12/2020   Iron deficiency anemia    hx iron infusions with Dr. Timmy   Joint pain    Swallowing difficulty    Thyroid  disease    hypothyroid     Past Surgical History:  Procedure Laterality Date   ABDOMINAL HYSTERECTOMY     AUGMENTATION MAMMAPLASTY  2004   BIOPSY  04/21/2019   Procedure: BIOPSY;  Surgeon: Burnette Fallow, MD;  Location: [facility];  Service: Endoscopy;;   BREAST SURGERY     augmentation   COSMETIC SURGERY  2004   Breast augmentation   ESOPHAGEAL DILATION     Dr. Rosalie.  Procedure done twice in past.    ESOPHAGOGASTRODUODENOSCOPY N/A 04/21/2019   Procedure: ESOPHAGOGASTRODUODENOSCOPY (EGD);  Surgeon: Burnette Fallow, MD;  Location: [facility] END[facility]COPY;  Service: Endoscopy;  Laterality: N/A;   EYE SURGERY     lasik   FOOT FRACTURE SURGERY Left    2020  LAPAROSCOPIC SUPRACERVICAL HYSTERECTOMY  07/10/2011   Procedure: LAPAROSCOPIC SUPRACERVICAL HYSTERECTOMY;  Surgeon: Bobie DELENA Cary;  Location: [facility];  Service: Gynecology;  Laterality: N/A;  Laparoscopic Supracervical Hysterectomy & Cystoscopy   PROXIMAL INTERPHALANGEAL FUSION (PIP) Left 01/05/2019   Procedure: PROXIMAL INTERPHALANGEAL FUSION (PIP);  Surgeon: Elsa Lonni SAUNDERS, MD;  Location: [facility];  Service: Orthopedics;   Laterality: Left;   right knee     vestibuectomy  2003   Dr Cary    Family History  Problem Relation Age of Onset   Rheum arthritis Mother    Hypertension Mother    Migraines Mother    Hyperlipidemia Mother    Thyroid  disease Mother    Arthritis Mother    Thyroid  disease Father    Heart attack Father 52       with cardiac arrest revived   Obesity Father    Seizures Sister    Hypertension Maternal Grandmother    Hyperlipidemia Maternal Grandmother    Stroke Maternal Grandmother    Heart disease Maternal Grandmother    Hypertension Paternal Grandmother    Stroke Paternal Grandmother    Asthma Daughter    Miscarriages / Stillbirths Daughter      Social Connections: Socially Integrated (04/10/2023)   Social Connection and Isolation Panel    Frequency of Communication with Friends and Family: Three times a week    Frequency of Social Gatherings with Friends and Family: Twice a week    Attends Religious Services: More than 4 times per year    Active Member of [employer] or Organizations: Yes    Attends Engineer, Structural: More than 4 times per year    Marital Status: Married     Current Medications[1]   Physical Exam:   BP 128/84   Pulse (!) 57   Ht 5' 7 (1.702 m)   Wt 200 lb (90.7 kg)   LMP 06/22/2011   SpO2 97%   BMI 31.32 kg/m   Pertinent Findings  CN II-XII grossly intact Bilateral EAC clear and TM intact with well pneumatized middle ear spaces Anterior rhinoscopy: Septum right deviation ; bilateral inferior turbinates with no hypertrophy  No lesions of oral cavity/oropharynx; dentition wnl No obviously palpable neck masses/lymphadenopathy/thyromegaly No respiratory distress or stridor Crepitus left TMJ, no redness swelling, or fluctuance       Seprately Identifiable Procedures:  None  Impression & Plans:  Kayla Salazar is a 48 y.o. female with the following   Assessment and Plan    Left ear pain with hearing loss and tinnitus Significant left  otalgia, aural fullness, intermittent conductive hearing loss, and tinnitus post-TMJ injections. Increased sclerosis of left tympanic membrane noted. Etiology unclear; differential includes referred otalgia from TMJ inflammation, local inflammatory reaction, Eustachian tube dysfunction, seroma, hematoma, other TMJ pathology, and neuropathic pain. Further diagnostics needed to exclude middle ear and structural pathology. - Ordered audiogram to assess hearing function, middle ear status, and tympanic membrane mobility. - Ordered MRI of the IAC  - Arranged follow-up via phone to review results and determine next steps.  Temporomandibular joint disorder TMJ disorder with prior jaw clicking, tightness, and trismus, refractory to multiple PRP-type injections and mouth guard therapy. Onset of otologic symptoms post-injection suggests local inflammatory or neuropathic complication. Chronicity and lack of response to standard therapies suggest complex or atypical course, possibly with referred otalgia. - Ordered MRI of the IAC  - Planned to review imaging and audiogram results to guide further management. - Discussed potential treatment options such  as gabapentin if neuropathic pain is confirmed.         - f/u Phone call with MRI and audio results    Thank you for allowing me the opportunity to care for your patient. Please do not hesitate to contact me should you have any other questions.  Sincerely, Chyrl Cohen PA-C [facility] Phone: [phone] Fax: [phone]  12/30/2024, 9:22 AM        [1]  Current Outpatient Medications:    acetaminophen  (TYLENOL ) 650 MG CR tablet, Take 1,300 mg by mouth every 8 (eight) hours as needed for pain., Disp: , Rfl:    certolizumab pegol  (CIMZIA , 2 SYRINGE,) 200 MG/ML prefilled syringe, Inject 200mg  subcutaneously every other week., Disp: 2 each, Rfl: 5   certolizumab pegol  (CIMZIA -STARTER) 200 MG/ML prefilled syringe, Inject 400mg  (2  syringes) subcutaneously at week 0, 2, and 4 for loading dose, Disp: 3 each, Rfl: 0   estradiol  (VIVELLE -DOT) 0.05 MG/24HR patch, Place 1 patch (0.05 mg total) onto the skin 2 (two) times a week., Disp: 24 patch, Rfl: 1   levothyroxine  (SYNTHROID ) 175 MCG tablet, Take 1 tablet (175 mcg total) by mouth daily., Disp: 90 tablet, Rfl: 3   omeprazole  (PRILOSEC) 40 MG capsule, Take 1 capsule (40 mg total) by mouth in the morning 30 minutes before meal, Disp: 90 capsule, Rfl: 2   Vitamin D -Vitamin K (VITAMIN K2-VITAMIN D3 PO), Take 125 mcg by mouth daily at 12 noon., Disp: , Rfl:

## 2024-12-30 NOTE — Telephone Encounter (Signed)
 Can you let her know that they are two different studies and it would be essential two MRI orders. We would have to see how the IAC looks before proceeding with another MRI.

## 2025-01-01 ENCOUNTER — Other Ambulatory Visit: Payer: Self-pay

## 2025-01-01 ENCOUNTER — Other Ambulatory Visit: Payer: Self-pay | Admitting: Pharmacy Technician

## 2025-01-01 NOTE — Progress Notes (Signed)
"  Encounter opened in error  "

## 2025-01-05 ENCOUNTER — Encounter (INDEPENDENT_AMBULATORY_CARE_PROVIDER_SITE_OTHER): Payer: Self-pay | Admitting: Nurse Practitioner

## 2025-01-05 ENCOUNTER — Ambulatory Visit (INDEPENDENT_AMBULATORY_CARE_PROVIDER_SITE_OTHER): Admitting: Nurse Practitioner

## 2025-01-05 ENCOUNTER — Other Ambulatory Visit (HOSPITAL_COMMUNITY): Payer: Self-pay

## 2025-01-05 ENCOUNTER — Other Ambulatory Visit: Payer: Self-pay

## 2025-01-05 VITALS — BP 128/83 | HR 57 | Temp 98.1°F | Ht 66.5 in | Wt 200.0 lb

## 2025-01-05 DIAGNOSIS — Z6831 Body mass index (BMI) 31.0-31.9, adult: Secondary | ICD-10-CM | POA: Diagnosis not present

## 2025-01-05 DIAGNOSIS — E559 Vitamin D deficiency, unspecified: Secondary | ICD-10-CM

## 2025-01-05 DIAGNOSIS — K760 Fatty (change of) liver, not elsewhere classified: Secondary | ICD-10-CM

## 2025-01-05 DIAGNOSIS — E66811 Obesity, class 1: Secondary | ICD-10-CM | POA: Diagnosis not present

## 2025-01-05 DIAGNOSIS — E063 Autoimmune thyroiditis: Secondary | ICD-10-CM | POA: Diagnosis not present

## 2025-01-05 DIAGNOSIS — G479 Sleep disorder, unspecified: Secondary | ICD-10-CM

## 2025-01-05 MED ORDER — PHENTERMINE HCL 8 MG PO TABS
1.0000 | ORAL_TABLET | Freq: Every day | ORAL | 0 refills | Status: AC
  Filled 2025-01-05: qty 30, 30d supply, fill #0

## 2025-01-05 NOTE — Progress Notes (Signed)
 " Office: [phone]  /  Fax: [phone]  WEIGHT SUMMARY AND BIOMETRICS  Weight Lost Since Last Visit: 0  Weight Gained Since Last Visit: 0   Vitals Temp: 98.1 F (36.7 C) BP: 128/83 Pulse Rate: (!) 57 SpO2: 96 %   Anthropometric Measurements Height: 5' 6.5 (1.689 m) Weight: 200 lb (90.7 kg) BMI (Calculated): 31.8 Weight at Last Visit: 200 lb Weight Lost Since Last Visit: 0 Weight Gained Since Last Visit: 0 Starting Weight: 196 lb Total Weight Loss (lbs): 0 lb (0 kg) Peak Weight: 200 lb   Body Composition  Body Fat %: 43.8 % Fat Mass (lbs): 88 lbs Muscle Mass (lbs): 107 lbs Total Body Water (lbs): 81.2 lbs Visceral Fat Rating : 10   Other Clinical Data Fasting: Yes Labs: No Today's Visit #: 3 Starting Date: 11/12/24    Total Weight Loss: 0 pounds Bio Impedance Data reviewed with patient: Muscle is down 1.8 pounds, adipose is up 2.2 pounds HPI  Chief Complaint: OBESITY  Kayla Salazar is here to discuss her progress with her obesity treatment plan. She is on the the Category 1 Plan and states she is following her eating plan approximately 20 % of the time. She states she is not currently exercising   She does have a history of MASLD- U/S 05/26/24 showed mild hepatic steatosis. She is trying to limit simple carbohydrates and saturated fats. Working on weight loss.    She does have Vit D deficiency and is currently taking D3K2 1 cap once a day- plan is to recheck level in 6 months.  She is on Levothyroxine  175 mcg daily for hypothyroidism. Last level was in therapeutic range Lab Results  Component Value Date   TSH 0.77 08/10/2024    Interval History:  Since last office visit she has been getting about 80 grams of protein daily. She has not been skipping meals.  She has been doing lots of fruits and vegetables, her water intake is about 64 ounces of water daily. She still will have a half and half tea at dinner.   She continues to have a lot of food noise.  Occurs anytime throughout the day- will want chips(salty/crunchy). She previously did a compounded ozempic and did not lose weight .  Has never tried Phentermine  or other weight loss medications.   She has a lot of trouble sleeping- she can fall asleep well and sleeps for 2 hours and then wakes up and has trouble falling back asleep. She does have a lot of night sweats- currently on HRT.  PHYSICAL EXAM:  Blood pressure 128/83, pulse (!) 57, temperature 98.1 F (36.7 C), height 5' 6.5 (1.689 m), weight 200 lb (90.7 kg), last menstrual period 06/22/2011, SpO2 96%. Body mass index is 31.8 kg/m.  General: Well Developed, well nourished, and in no acute distress.  HEENT: Normocephalic, atraumatic; EOMI, sclerae are anicteric. Skin: Warm and dry, good turgor Chest:  Normal excursion, shape, no gross ABN Respiratory: No conversational dyspnea; speaking in full sentences NeuroM-Sk:  Normal gross ROM * 4 extremities  Psych: A and O X 3, insight adequate, mood- full    DIAGNOSTIC DATA REVIEWED:  BMET    Component Value Date/Time   NA 142 06/05/2023 0833   NA 138 08/18/2019 1147   K 4.7 06/05/2023 0833   CL 106 06/05/2023 0833   CO2 23 06/05/2023 0833   GLUCOSE 90 06/05/2023 0833   BUN 11 06/05/2023 0833   BUN 8 08/18/2019 1147   CREATININE 0.66 06/05/2023 9166  CREATININE 0.57 05/01/2022 1402   CALCIUM 9.0 06/05/2023 0833   GFRNONAA 112 08/18/2019 1147   GFRAA 130 08/18/2019 1147   No results found for: HGBA1C No results found for: INSULIN  Lab Results  Component Value Date   TSH 0.77 08/10/2024   CBC    Component Value Date/Time   WBC 7.1 06/05/2023 0833   RBC 4.21 06/05/2023 0833   HGB 12.0 06/05/2023 0833   HGB 12.1 08/18/2019 1147   HGB 11.6 (L) 10/15/2016 0954   HGB 11.7 11/10/2013 0935   HGB 9.3 (L) 09/07/2013 1618   HCT 37.1 06/05/2023 0833   HCT 37.5 08/18/2019 1147   HCT 36.2 11/10/2013 0935   HCT 28.9 (L) 09/07/2013 1618   PLT 236.0 06/05/2023 0833    PLT 222 08/18/2019 1147   MCV 88.1 06/05/2023 0833   MCV 89 08/18/2019 1147   MCV 85 11/10/2013 0935   MCV 76.3 (L) 09/07/2013 1618   MCH 29.0 05/01/2022 1402   MCHC 32.4 06/05/2023 0833   RDW 13.6 06/05/2023 0833   RDW 11.9 08/18/2019 1147   RDW 16.9 (H) 11/10/2013 0935   RDW 14.4 09/07/2013 1618   Iron Studies    Component Value Date/Time   IRON 63 11/10/2013 0935   TIBC 230 (L) 11/10/2013 0935   FERRITIN 63 11/10/2013 0935   IRONPCTSAT 27 11/10/2013 0935   Lipid Panel     Component Value Date/Time   CHOL 182 08/10/2024 1556   CHOL 162 08/18/2019 1147   TRIG 141.0 08/10/2024 1556   HDL 47.20 08/10/2024 1556   HDL 50 08/18/2019 1147   CHOLHDL 4 08/10/2024 1556   VLDL 28.2 08/10/2024 1556   LDLCALC 107 (H) 08/10/2024 1556   LDLCALC 126 (H) 05/01/2022 1402   Hepatic Function Panel     Component Value Date/Time   PROT 7.0 06/05/2023 0833   PROT 6.9 08/18/2019 1147   ALBUMIN 3.9 06/05/2023 0833   ALBUMIN 4.3 08/18/2019 1147   AST 15 06/05/2023 0833   ALT 11 06/05/2023 0833   ALKPHOS 62 06/05/2023 0833   BILITOT 0.3 06/05/2023 0833   BILITOT 0.4 08/18/2019 1147   BILIDIR 0.1 06/30/2008 1101      Component Value Date/Time   TSH 0.77 08/10/2024 1556   Nutritional Lab Results  Component Value Date   VD25OH 32 05/01/2022   VD25OH 34 03/23/2021   VD25OH 29.7 (L) 08/18/2019     ASSESSMENT AND PLAN Class 1 obesity with serious comorbidity and body mass index (BMI) of 31.0 to 31.9 in adult, unspecified obesity type TREATMENT PLAN FOR OBESITY:  Recommended Dietary Goals  Kayla Salazar is currently in the action stage of change. As such, her goal is to continue weight management plan. She has agreed to the Category 1 Plan.  Behavioral Intervention  We discussed the following Behavioral Modification Strategies today: increasing lean protein intake to established goals, avoiding skipping meals, increasing water intake , better snacking choices, continue to work on  maintaining a reduced calorie state, getting the recommended amount of protein, incorporating whole foods, making healthy choices, staying well hydrated and practicing mindfulness when eating., and increase protein intake, fibrous foods (25 grams per day for women, 30 grams for men) and water to improve satiety and decrease hunger signals. .  Additional resources provided today: Snacking choices  Recommended Physical Activity Goals  Wenonah has been advised to work up to 150 minutes of moderate intensity aerobic activity a week and strengthening exercises 2-3 times per week for cardiovascular health, weight  loss maintenance and preservation of muscle mass.   She has agreed to Think about enjoyable ways to increase daily physical activity and overcoming barriers to exercise and Increase physical activity in their day and reduce sedentary time (increase NEAT).   Pharmacotherapy We discussed various medication options to help Nancee with her weight loss efforts and we both agreed to start Phentermine  - Lomaira  8 mg 1 tab PO daily in early morning- counseled on side effects. Consent signed and I have consulted the [place] Controlled Substances Registry for this patient, and feel the risk/benefit ratio today is favorable for proceeding with this prescription for a controlled substance. No aberrancies noted.  Patient was counseled on the importance of maintaining healthy lifestyle habits, including balanced nutrition, regular physical activity, and behavioral modifications, while taking antiobesity medication.  Patient verbalized understanding that medication is an adjunct to, not a replacement for, lifestyle changes and that the long-term success and weight maintenance depend on continued adherence to these strategies.   ASSOCIATED CONDITIONS ADDRESSED TODAY  Action/Plan  [place] Thyroiditis       Continue Levothyroxine  175 mcg daily, last level was in therapeutic range  Vitamin D  deficiency Low  vitamin D  levels can be associated with adiposity and may result in leptin resistance and weight gain. Also associated with fatigue.  Currently on vitamin D  supplementation without any adverse effects such as nausea, vomiting or muscle weakness.    Metabolic dysfunction-associated steatotic liver disease (MASLD) ContinueCategory 1 meal plan and focus on limiting saturated fats and simple carbohydrates Loss of 10-15% of body weight can help improve hepatic steatosis   Class 1 obesity with serious comorbidity and body mass index (BMI) of 31.0 to 31.9 in adult, unspecified obesity type See plan above -     Phentermine  HCl; Take 1 tablet (8 mg total) by mouth daily.  Dispense: 30 tablet; Refill: 0  Difficulty sleeping      Counseled on use of CALM sleep to help stay asleep      Practice good sleep hygiene    Return in about 4 weeks (around 02/02/2025).SABRA She was informed of the importance of frequent follow up visits to maximize her success with intensive lifestyle modifications for her multiple health conditions.   ATTESTASTION STATEMENTS:  Reviewed by clinician on day of visit: allergies, medications, problem list, medical history, surgical history, family history, social history, and previous encounter notes.     Lonell Liverpool ANP-C "

## 2025-01-08 ENCOUNTER — Encounter: Payer: Self-pay | Admitting: Internal Medicine

## 2025-01-08 ENCOUNTER — Encounter (INDEPENDENT_AMBULATORY_CARE_PROVIDER_SITE_OTHER): Payer: Self-pay

## 2025-01-08 ENCOUNTER — Ambulatory Visit: Payer: Self-pay

## 2025-01-08 ENCOUNTER — Telehealth (INDEPENDENT_AMBULATORY_CARE_PROVIDER_SITE_OTHER): Payer: Self-pay | Admitting: Physician Assistant

## 2025-01-08 DIAGNOSIS — D352 Benign neoplasm of pituitary gland: Secondary | ICD-10-CM

## 2025-01-08 NOTE — Telephone Encounter (Signed)
 I spoke with Kayla Salazar today about her MRI results which showed a 9 mm pituitary lesion most compatible with microadenoma.  No other acute findings to explain her left-sided ear related complaints.  She is awaiting her audiological evaluation, once these results are available I will discuss them further with her as it relates to her left ear.  As far as the pituitary lesion I discussed this with the patient, she will need to follow-up with endocrinology for laboratory analysis, she will also reach out to her primary care provider for surveillance of this if they do not recommend neurosurgical evaluation.  Patient verbalized understanding and agreement to this plan.

## 2025-01-08 NOTE — Telephone Encounter (Signed)
Thanks I spoke with her

## 2025-01-08 NOTE — Telephone Encounter (Signed)
 FYI Only or Action Required?: Action required by provider: requesting lab order.  Patient was last seen in primary care on 01/05/2025 by [name], NP.  Called Nurse Triage reporting No chief complaint on file..  Symptoms began yesterday.  Interventions attempted: Nothing.  Symptoms are: stable.  Triage Disposition: No disposition on file.  Patient/caregiver understands and will follow disposition?:   Copied from CRM [id number]. Topic: Clinical - Red Word Triage >> Jan 08, 2025 10:43 AM Antwanette L wrote: Red Word that prompted transfer to Nurse Triage:  Patient is experiencing left ear pain, fullness, and twitching. ENT completed an MRI and identified a pituitary tumor; ENT advised this is outside their scope. Patient is attempting to follow up with the provider and schedule a lab visit. Reason for Disposition  [1] Follow-up call from patient regarding patient's clinical status AND [2] information NON-URGENT  Answer Assessment - Initial Assessment Questions 1. REASON FOR CALL or QUESTION: What is your reason for calling today? or How can I best     Requesting lab order for thyroid  panel as suggested by ENT 2. CALLER: Document the source of call. (e.g., laboratory staff, caregiver or patient).     Patient  Protocols used: PCP Call - No Triage-A-AH

## 2025-01-11 NOTE — Telephone Encounter (Signed)
Appt scheduled for 11/5

## 2025-01-11 NOTE — Telephone Encounter (Signed)
 Pt has appt to address w Lauraine Pereyra on 1/15

## 2025-01-14 ENCOUNTER — Ambulatory Visit (INDEPENDENT_AMBULATORY_CARE_PROVIDER_SITE_OTHER): Admitting: Nurse Practitioner

## 2025-01-14 VITALS — BP 128/82 | HR 70 | Temp 98.3°F | Ht 66.5 in | Wt 205.0 lb

## 2025-01-14 DIAGNOSIS — D497 Neoplasm of unspecified behavior of endocrine glands and other parts of nervous system: Secondary | ICD-10-CM | POA: Insufficient documentation

## 2025-01-14 NOTE — Progress Notes (Signed)
 "  Established Patient Office Visit  Subjective   Patient ID: Kayla Salazar, female    DOB: 21-Jul-1976  Age: 49 y.o. MRN: [id number]  Chief Complaint  Patient presents with   Labs Only    Pt is requesting labs due to having found out she has pituitary tumor.    Discussed the use of AI scribe software for clinical note transcription with the patient, who gave verbal consent to proceed.  History of Present Illness Kayla Salazar is a 49 year old female who presents for evaluation and baseline testing of a pituitary tumor. She was referred by an ENT provider for evaluation of a pituitary tumor.  Pituitary tumor evaluation - Recently diagnosed incidental pituitary tumor identified on MRI ordered by ENT specialist for evaluation of TMJ/hearing loss - She has hypothyroidism at baseline, also perimenopausal and is being treated with HRT.  - She has chronic, stable fatigue. Has had worsening insomnia/irritability, but thought this could be related to menopause. Minimal weight gain (~5lbs over the last month), intermittent headache. No significant visual changes (blurry/double vision), no palpations, no tremor, no nipple discharge.      Review of Systems  Reason unable to perform ROS: Negative for nipple discharge.  Constitutional:  Positive for malaise/fatigue. Negative for weight loss (weight gain).  Cardiovascular:  Negative for palpitations.  Psychiatric/Behavioral:  The patient has insomnia.       Objective:     BP 128/82   Pulse 70   Temp 98.3 F (36.8 C) (Temporal)   Ht 5' 6.5 (1.689 m)   Wt 205 lb (93 kg)   LMP 06/22/2011   SpO2 98%   BMI 32.59 kg/m  BP Readings from Last 3 Encounters:  01/14/25 128/82  01/05/25 128/83  12/29/24 128/84   Wt Readings from Last 3 Encounters:  01/14/25 205 lb (93 kg)  01/05/25 200 lb (90.7 kg)  12/29/24 200 lb (90.7 kg)      Physical Exam Vitals reviewed.  Constitutional:      General: She is not in acute distress.     Appearance: Normal appearance.  HENT:     Head: Normocephalic and atraumatic.  Cardiovascular:     Rate and Rhythm: Normal rate and regular rhythm.     Pulses: Normal pulses.     Heart sounds: Normal heart sounds.  Pulmonary:     Effort: Pulmonary effort is normal.     Breath sounds: Normal breath sounds.  Skin:    General: Skin is warm and dry.  Neurological:     General: No focal deficit present.     Mental Status: She is alert and oriented to person, place, and time.  Psychiatric:        Mood and Affect: Mood normal.        Behavior: Behavior normal.        Judgment: Judgment normal.      No results found for any visits on 01/14/25.    The 10-year ASCVD risk score (Arnett DK, et al., 2019) is: 1.2%    Assessment & Plan:   Problem List Items Addressed This Visit       Endocrine   Pituitary tumor - Primary   Relevant Orders   Prolactin   Estradiol    Insulin -like growth factor   ACTH   Anti-TPO Ab (RDL)   Cortisol   FSH   LH   T4, free   TSH   Testosterone   Assessment and Plan Assessment & Plan Pituitary tumor Pituitary tumor  identified via MRI. Symptoms are generally mild but currently unclear if they could be related to excessive hormonal secretion.  - Ordered baseline labs, to be drawn in the morning. - Provided contact information for [facility] endocrinology for follow-up. - Consulted with supervising physician regarding the impact of estradiol  patch on lab results. Thought to be unlikely to mask abnormal values greatly. Thus, no washout period recommended. However, patient will discuss further with endocrinology when they see them at follow-up.  - Further recommendations may be made based on the results.     Return if symptoms worsen or fail to improve.    Lauraine FORBES Pereyra, NP  "

## 2025-01-14 NOTE — Patient Instructions (Addendum)
 Endocrinology   [phone]

## 2025-01-14 NOTE — Assessment & Plan Note (Signed)
 Pituitary tumor Pituitary tumor identified via MRI. Symptoms are generally mild but currently unclear if they could be related to excessive hormonal secretion.  - Ordered baseline labs, to be drawn in the morning. - Provided contact information for [facility] endocrinology for follow-up. - Consulted with supervising physician regarding the impact of estradiol  patch on lab results. Thought to be unlikely to mask abnormal values greatly. Thus, no washout period recommended. However, patient will discuss further with endocrinology when they see them at follow-up.  - Further recommendations may be made based on the results.

## 2025-01-15 ENCOUNTER — Other Ambulatory Visit (HOSPITAL_COMMUNITY)
Admission: RE | Admit: 2025-01-15 | Discharge: 2025-01-15 | Disposition: A | Discharge status: Home / Self Care | Source: Ambulatory Visit | Attending: Internal Medicine | Admitting: Internal Medicine

## 2025-01-15 DIAGNOSIS — D497 Neoplasm of unspecified behavior of endocrine glands and other parts of nervous system: Secondary | ICD-10-CM | POA: Insufficient documentation

## 2025-01-18 ENCOUNTER — Other Ambulatory Visit (HOSPITAL_COMMUNITY)
Admission: RE | Admit: 2025-01-18 | Discharge: 2025-01-18 | Disposition: A | Discharge status: Home / Self Care | Source: Ambulatory Visit | Attending: Internal Medicine | Admitting: Internal Medicine

## 2025-01-18 DIAGNOSIS — D497 Neoplasm of unspecified behavior of endocrine glands and other parts of nervous system: Secondary | ICD-10-CM | POA: Insufficient documentation

## 2025-01-18 LAB — CORTISOL: Cortisol, Plasma: 9.1 ug/dL

## 2025-01-18 LAB — T4, FREE: Free T4: 0.98 ng/dL (ref 0.80–2.00)

## 2025-01-18 LAB — TSH: TSH: 1.75 u[IU]/mL (ref 0.350–4.500)

## 2025-01-19 LAB — TESTOSTERONE: Testosterone: 11 ng/dL (ref 4–50)

## 2025-01-19 LAB — FOLLICLE STIMULATING HORMONE: FSH: 42.2 m[IU]/mL

## 2025-01-19 LAB — INSULIN-LIKE GROWTH FACTOR: Somatomedin C: 123 ng/mL (ref 70–225)

## 2025-01-19 LAB — LUTEINIZING HORMONE: LH: 24.1 m[IU]/mL

## 2025-01-19 LAB — PROLACTIN: Prolactin: 31.3 ng/mL (ref 4.8–33.4)

## 2025-01-19 LAB — ESTRADIOL: Estradiol: 21.5 pg/mL

## 2025-01-20 LAB — MISC LABCORP TEST (SEND OUT): Labcorp test code: 140761

## 2025-01-21 ENCOUNTER — Encounter: Payer: Self-pay | Admitting: Nurse Practitioner

## 2025-01-21 ENCOUNTER — Other Ambulatory Visit: Payer: Self-pay | Admitting: Nurse Practitioner

## 2025-01-21 DIAGNOSIS — D497 Neoplasm of unspecified behavior of endocrine glands and other parts of nervous system: Secondary | ICD-10-CM

## 2025-01-21 DIAGNOSIS — E039 Hypothyroidism, unspecified: Secondary | ICD-10-CM

## 2025-01-21 NOTE — Telephone Encounter (Signed)
 Pt is aware of instructions per Lauraine, NP.

## 2025-01-21 NOTE — Telephone Encounter (Signed)
 Patient has called with questions regarding the ACTH results from recent labs done. She also was not able to see her results from Anti TPO Ab in myChart and was wondering what the results were as well. Please contact her to go over she wants you to try her cell phone first and if no answer call her office number. Provided both numbers bellow.  Phone number:  [phone]  (cell) , [phone]  (office)

## 2025-01-22 ENCOUNTER — Ambulatory Visit: Payer: Self-pay | Admitting: Internal Medicine

## 2025-01-22 ENCOUNTER — Other Ambulatory Visit

## 2025-01-22 DIAGNOSIS — E039 Hypothyroidism, unspecified: Secondary | ICD-10-CM

## 2025-01-22 DIAGNOSIS — D497 Neoplasm of unspecified behavior of endocrine glands and other parts of nervous system: Secondary | ICD-10-CM

## 2025-01-22 LAB — MISC LABCORP TEST (SEND OUT): Labcorp test code: 520018

## 2025-01-25 ENCOUNTER — Ambulatory Visit (INDEPENDENT_AMBULATORY_CARE_PROVIDER_SITE_OTHER): Admitting: Nurse Practitioner

## 2025-01-26 ENCOUNTER — Other Ambulatory Visit: Payer: Self-pay

## 2025-01-26 ENCOUNTER — Encounter (INDEPENDENT_AMBULATORY_CARE_PROVIDER_SITE_OTHER): Payer: Self-pay

## 2025-01-28 ENCOUNTER — Telehealth: Payer: Self-pay

## 2025-01-28 ENCOUNTER — Other Ambulatory Visit (HOSPITAL_COMMUNITY): Payer: Self-pay

## 2025-01-28 ENCOUNTER — Other Ambulatory Visit: Payer: Self-pay | Admitting: Pharmacy Technician

## 2025-01-28 ENCOUNTER — Other Ambulatory Visit: Payer: Self-pay | Admitting: Nurse Practitioner

## 2025-01-28 ENCOUNTER — Other Ambulatory Visit: Payer: Self-pay

## 2025-01-28 DIAGNOSIS — D497 Neoplasm of unspecified behavior of endocrine glands and other parts of nervous system: Secondary | ICD-10-CM

## 2025-01-28 NOTE — Telephone Encounter (Signed)
 Copied from CRM #[id number]. Topic: Clinical - Lab/Test Results >> Jan 28, 2025  9:36 AM Viola F wrote: Reason for CRM: Patient called to follow up on lab results from 01/22/25 - please call her with an update at  [phone]

## 2025-01-28 NOTE — Progress Notes (Signed)
 Specialty Pharmacy Refill Coordination Note  Kayla Salazar is a 49 y.o. female contacted today regarding refills of specialty medication(s) Certolizumab Pegol  (Cimzia -Starter, Cimzia  (2 Syringe))   Patient requested Pickup at [facility] at [place] date: 02/05/25   Medication will be filled on: 02/04/25

## 2025-01-29 ENCOUNTER — Encounter: Payer: Self-pay | Admitting: Nurse Practitioner

## 2025-01-29 ENCOUNTER — Ambulatory Visit (INDEPENDENT_AMBULATORY_CARE_PROVIDER_SITE_OTHER): Admitting: Audiology

## 2025-01-29 ENCOUNTER — Telehealth: Payer: Self-pay

## 2025-01-29 DIAGNOSIS — Z0110 Encounter for examination of ears and hearing without abnormal findings: Secondary | ICD-10-CM

## 2025-01-29 DIAGNOSIS — H93292 Other abnormal auditory perceptions, left ear: Secondary | ICD-10-CM

## 2025-01-29 LAB — ACTH

## 2025-01-29 LAB — ANTI-TPO AB (RDL): Anti-TPO Ab (RDL): 57.8 [IU]/mL — ABNORMAL HIGH

## 2025-01-29 NOTE — Progress Notes (Signed)
" °  [address], [address] [place], [place] [address] [phone]  Audiological Evaluation    Name: Kayla Salazar     DOB:   01-Mar-1976      MRN:   [id number]                                                                                     Service Date: 01/29/2025     Accompanied by: self    Patient comes today after Reyes Cohen, PA-C sent a referral for a hearing evaluation due to concerns with ear pain.   Symptoms Yes Details  Hearing loss  []    Tinnitus  [x]  Left ear tinnitus, intermittent  Ear pain/ infections/pressure  [x]  Left ear pain reportedly due to TMJ Disorder- for at least 5 years  Balance problems  []    Noise exposure history  []    Previous ear surgeries  [x]  Multiple sets of tubes as a child  Family history of hearing loss  [x]  Grandfather with age  Amplification  []    Other  []      Otoscopy: Right ear: abnormal eardrum appearance. Left ear:  abnormal eardrum appearance.  Tympanometry: Right ear: Type A - Normal external ear canal volume with normal middle ear pressure and normal tympanic membrane compliance. Findings are consistent with normal middle ear function. Left ear: Type A - Normal external ear canal volume with normal middle ear pressure and normal tympanic membrane compliance. Findings are consistent with normal middle ear function.   Hearing Evaluation The hearing test results were completed under headphones and results are deemed to be of good reliability. Test technique:  conventional    Pure tone Audiometry: Both ears- Normal hearing from [phone] Hz.  Hz.  Speech Audiometry: Right ear- Speech Reception Threshold (SRT) was obtained at 10 dBHL. Left ear-Speech Reception Threshold (SRT) was obtained at 10 dBHL.   Word Recognition Score Tested using NU-6 (recorded) Right ear: 100% was obtained at a presentation level of 55 dBHL with contralateral masking which is deemed as  excellent. Left ear: 100% was obtained at a presentation  level of 55 dBHL with contralateral masking which is deemed as  excellent.   Impression: There is not a significant difference in pure-tone thresholds between ears., There is not a significant difference in the word recognition score in between ears.    Recommendations: Follow up with ENT as scheduled. Return for a hearing evaluation if concerns with hearing changes arise or per MD recommendation.   Wanda Rideout MARIE LEROUX-MARTINEZ, AUD  "

## 2025-01-29 NOTE — Telephone Encounter (Signed)
 Copied from CRM #[id number]. Topic: Clinical - Request for Lab/Test Order >> Jan 29, 2025  2:38 PM Viola F wrote: Reason for CRM: Janelle from [employer] called regarding the ACTH order - the specimen was sent refrigerated and it has to be frozen so the test can't be ran. She wants to let the office know that the specimen now has to be recollected. Her call back number is [phone]

## 2025-01-29 NOTE — Telephone Encounter (Signed)
 Matter being handled in separate telephone note (01/21/25)

## 2025-02-02 ENCOUNTER — Ambulatory Visit: Admitting: Internal Medicine

## 2025-02-02 ENCOUNTER — Encounter: Payer: Self-pay | Admitting: Internal Medicine

## 2025-02-02 ENCOUNTER — Ambulatory Visit (INDEPENDENT_AMBULATORY_CARE_PROVIDER_SITE_OTHER): Admitting: Nurse Practitioner

## 2025-02-02 ENCOUNTER — Encounter (INDEPENDENT_AMBULATORY_CARE_PROVIDER_SITE_OTHER): Payer: Self-pay | Admitting: Nurse Practitioner

## 2025-02-02 VITALS — BP 120/68 | HR 66 | Ht 66.5 in | Wt 202.0 lb

## 2025-02-02 VITALS — BP 123/81 | HR 87 | Temp 98.3°F | Ht 66.5 in | Wt 199.0 lb

## 2025-02-02 DIAGNOSIS — K760 Fatty (change of) liver, not elsewhere classified: Secondary | ICD-10-CM | POA: Diagnosis not present

## 2025-02-02 DIAGNOSIS — E559 Vitamin D deficiency, unspecified: Secondary | ICD-10-CM

## 2025-02-02 DIAGNOSIS — E063 Autoimmune thyroiditis: Secondary | ICD-10-CM | POA: Insufficient documentation

## 2025-02-02 DIAGNOSIS — Z6831 Body mass index (BMI) 31.0-31.9, adult: Secondary | ICD-10-CM | POA: Diagnosis not present

## 2025-02-02 DIAGNOSIS — E66811 Obesity, class 1: Secondary | ICD-10-CM

## 2025-02-02 DIAGNOSIS — D352 Benign neoplasm of pituitary gland: Secondary | ICD-10-CM | POA: Insufficient documentation

## 2025-02-02 MED ORDER — ZEPBOUND 2.5 MG/0.5ML ~~LOC~~ SOAJ: 2.5000 mg | SUBCUTANEOUS | 0 refills | Status: DC

## 2025-02-02 MED ORDER — TIRZEPATIDE-WEIGHT MANAGEMENT 5 MG/0.5ML ~~LOC~~ SOLN: 5.0000 mg | SUBCUTANEOUS | 0 refills | Status: AC

## 2025-02-02 NOTE — Progress Notes (Signed)
 " Office: [phone]  /  Fax: [phone]  WEIGHT SUMMARY AND BIOMETRICS  Weight Lost Since Last Visit: 1lb  Weight Gained Since Last Visit: 1lb   Vitals Temp: 98.3 F (36.8 C) BP: 123/81 Pulse Rate: 87 SpO2: 100 %   Anthropometric Measurements Height: 5' 6.5 (1.689 m) Weight: 199 lb (90.3 kg) BMI (Calculated): 31.64 Weight at Last Visit: 200lb Weight Lost Since Last Visit: 1lb Weight Gained Since Last Visit: 1lb Starting Weight: 196lb Total Weight Loss (lbs): 0 lb (0 kg) Peak Weight: 200lb   Body Composition  Body Fat %: 42.6 % Fat Mass (lbs): 84.8 lbs Muscle Mass (lbs): 108.6 lbs Total Body Water (lbs): 80.8 lbs Visceral Fat Rating : 10   Other Clinical Data Fasting: No Labs: No Today's Visit #: 4 Starting Date: 11/12/24     Bio Impedance Data reviewed with patient: Muscle is up 1.6 pounds, adipose is down 3.2 pounds  HPI  Chief Complaint: OBESITY  Kayla Salazar is here to discuss her progress with her obesity treatment plan. She is on the the Category 1 Plan and states she is following her eating plan approximately 75 % of the time. She states she is not currently exercising   Interval History:  Since last office visit she was seen by endocrinology today and was diagnosed with pituitary adenoma- not secreting. She has another lab to verify tomorrow and the plan is to monitor. She has been journaling but forgot at work She has tried Lomaira  8 mg and has not noticed a change in her appetite but notices increased appetite at nighttime.  Breakfast: Eggs with vegetables, 2 pieces of turkey sausage Lunch: protein and 2 vegetables. Dinner: Dayla with sweet potato with butter and cinnamon Snack at night: yogurt flips   She does have a history of MASLD-  an U/S 05/26/24 showed mild hepatic steatosis. She is trying to correct with nutrition, exercise and weight loss. Last liver enzymes were in normal range Last metabolic panel Lab Results  Component Value Date    GLUCOSE 90 06/05/2023   NA 142 06/05/2023   K 4.7 06/05/2023   CL 106 06/05/2023   CO2 23 06/05/2023   BUN 11 06/05/2023   CREATININE 0.66 06/05/2023   EGFR 99.0 08/14/2024   CALCIUM 9.0 06/05/2023   PROT 7.0 06/05/2023   ALBUMIN 3.9 06/05/2023   LABGLOB 2.6 08/18/2019   AGRATIO 1.7 08/18/2019   BILITOT 0.3 06/05/2023   ALKPHOS 62 06/05/2023   AST 15 06/05/2023   ALT 11 06/05/2023    She does have Vit D deficiency and is currently taking D3K2 1 cap every day - plan is to recheck level in 6 months. Last vitamin D  Lab Results  Component Value Date   VD25OH 32 05/01/2022    Pharmacotherapy for weight loss: She is currently taking Phentermine  8 mg every day for medical weight loss.  Denies side effects. Did not notice suppression of appetite     PHYSICAL EXAM:  Blood pressure 123/81, pulse 87, temperature 98.3 F (36.8 C), height 5' 6.5 (1.689 m), weight 199 lb (90.3 kg), last menstrual period 06/22/2011, SpO2 100%. Body mass index is 31.64 kg/m.  General: Well Developed, well nourished, and in no acute distress.  HEENT: Normocephalic, atraumatic; EOMI, sclerae are anicteric. Skin: Warm and dry, good turgor Chest:  Normal excursion, shape, no gross ABN Respiratory: No conversational dyspnea; speaking in full sentences NeuroM-Sk:  Normal gross ROM * 4 extremities  Psych: A and O X 3, insight adequate, mood- full  DIAGNOSTIC DATA REVIEWED:  BMET    Component Value Date/Time   NA 142 06/05/2023 0833   NA 138 08/18/2019 1147   K 4.7 06/05/2023 0833   CL 106 06/05/2023 0833   CO2 23 06/05/2023 0833   GLUCOSE 90 06/05/2023 0833   BUN 11 06/05/2023 0833   BUN 8 08/18/2019 1147   CREATININE 0.66 06/05/2023 0833   CREATININE 0.57 05/01/2022 1402   CALCIUM 9.0 06/05/2023 0833   GFRNONAA 112 08/18/2019 1147   GFRAA 130 08/18/2019 1147   No results found for: HGBA1C No results found for: INSULIN  Lab Results  Component Value Date   TSH 1.750 01/18/2025    CBC    Component Value Date/Time   WBC 7.1 06/05/2023 0833   RBC 4.21 06/05/2023 0833   HGB 12.0 06/05/2023 0833   HGB 12.1 08/18/2019 1147   HGB 11.6 (L) 10/15/2016 0954   HGB 11.7 11/10/2013 0935   HGB 9.3 (L) 09/07/2013 1618   HCT 37.1 06/05/2023 0833   HCT 37.5 08/18/2019 1147   HCT 36.2 11/10/2013 0935   HCT 28.9 (L) 09/07/2013 1618   PLT 236.0 06/05/2023 0833   PLT 222 08/18/2019 1147   MCV 88.1 06/05/2023 0833   MCV 89 08/18/2019 1147   MCV 85 11/10/2013 0935   MCV 76.3 (L) 09/07/2013 1618   MCH 29.0 05/01/2022 1402   MCHC 32.4 06/05/2023 0833   RDW 13.6 06/05/2023 0833   RDW 11.9 08/18/2019 1147   RDW 16.9 (H) 11/10/2013 0935   RDW 14.4 09/07/2013 1618   Iron Studies    Component Value Date/Time   IRON 63 11/10/2013 0935   TIBC 230 (L) 11/10/2013 0935   FERRITIN 63 11/10/2013 0935   IRONPCTSAT 27 11/10/2013 0935   Lipid Panel     Component Value Date/Time   CHOL 182 08/10/2024 1556   CHOL 162 08/18/2019 1147   TRIG 141.0 08/10/2024 1556   HDL 47.20 08/10/2024 1556   HDL 50 08/18/2019 1147   CHOLHDL 4 08/10/2024 1556   VLDL 28.2 08/10/2024 1556   LDLCALC 107 (H) 08/10/2024 1556   LDLCALC 126 (H) 05/01/2022 1402   Hepatic Function Panel     Component Value Date/Time   PROT 7.0 06/05/2023 0833   PROT 6.9 08/18/2019 1147   ALBUMIN 3.9 06/05/2023 0833   ALBUMIN 4.3 08/18/2019 1147   AST 15 06/05/2023 0833   ALT 11 06/05/2023 0833   ALKPHOS 62 06/05/2023 0833   BILITOT 0.3 06/05/2023 0833   BILITOT 0.4 08/18/2019 1147   BILIDIR 0.1 06/30/2008 1101      Component Value Date/Time   TSH 1.750 01/18/2025 0803   TSH 0.77 08/10/2024 1556   Nutritional Lab Results  Component Value Date   VD25OH 32 05/01/2022   VD25OH 34 03/23/2021   VD25OH 29.7 (L) 08/18/2019     ASSESSMENT AND PLAN Class 1 obesity with serious comorbidity and body mass index (BMI) of 31.0 to 31.9 in adult, unspecified obesity type TREATMENT PLAN FOR OBESITY:  Recommended  Dietary Goals  Kayla Salazar is currently in the action stage of change. As such, her goal is to continue weight management plan. She has agreed to the Category 1 Plan.  Behavioral Intervention  We discussed the following Behavioral Modification Strategies today: better snacking choices, continue to work on maintaining a reduced calorie state, getting the recommended amount of protein, incorporating whole foods, making healthy choices, staying well hydrated and practicing mindfulness when eating., and increase protein intake, fibrous foods (25 grams  per day for women, 30 grams for men) and water to improve satiety and decrease hunger signals. .    Recommended Physical Activity Goals  Kayla Salazar has been advised to work up to 150 minutes of moderate intensity aerobic activity a week and strengthening exercises 2-3 times per week for cardiovascular health, weight loss maintenance and preservation of muscle mass.   She has agreed to Start aerobic activity with a goal of 150 minutes a week at moderate intensity.  and Combine aerobic and strengthening exercises for efficiency and improved cardiometabolic health.   Pharmacotherapy We discussed various medication options to help Kayla Salazar with her weight loss efforts and we both agreed to start Zepbound  2.5 mg SQ QW- self pay.  No history of MEN2, pancreatitis or MTC. Counseled on use and side effects.  Patient was counseled on the importance of maintaining healthy lifestyle habits, including balanced nutrition, regular physical activity, and behavioral modifications, while taking antiobesity medication.  Patient verbalized understanding that medication is an adjunct to, not a replacement for, lifestyle changes and that the long-term success and weight maintenance depend on continued adherence to these strategies.    ASSOCIATED CONDITIONS ADDRESSED TODAY  Action/Plan  Vitamin D  deficiency Low vitamin D  levels can be associated with adiposity and may result in  leptin resistance and weight gain. Also associated with fatigue.  Currently on vitamin D  supplementation(cholecalciferol with K2 daily)  without any adverse effects such as nausea, vomiting or muscle weakness.    Metabolic dysfunction-associated steatotic liver disease (MASLD) Continue Category 1 meal plan and focus on limiting saturated fats and simple carbohydrates Loss of 10-15% of body weight can help improve hepatic steatosis   Class 1 obesity with serious comorbidity and body mass index (BMI) of 31.0 to 31.9 in adult, unspecified obesity type See plan above -     Tirzepatide -Weight Management; Inject 5 mg into the skin once a week.  Dispense: 2 mL; Refill: 0         Return in about 4 weeks (around 03/02/2025).SABRA She was informed of the importance of frequent follow up visits to maximize her success with intensive lifestyle modifications for her multiple health conditions.   ATTESTASTION STATEMENTS:  Reviewed by clinician on day of visit: allergies, medications, problem list, medical history, surgical history, family history, social history, and previous encounter notes.    Lonell Liverpool ANP-C "

## 2025-02-02 NOTE — Patient Instructions (Addendum)
 Please come back for labs at 8 AM, fasting.  Please continue 175 mcg levothyroxine  daily.  Take the thyroid  hormone every day, with water, at least 30 minutes before breakfast, separated by at least 4 hours from: - acid reflux medications - calcium - iron - multivitamins  Please return for another visit in 1 year - send me a message in July to order the MRI.

## 2025-02-03 ENCOUNTER — Other Ambulatory Visit

## 2025-02-04 ENCOUNTER — Other Ambulatory Visit: Payer: Self-pay

## 2025-02-04 LAB — CORTISOL: Cortisol, Plasma: 10.7 ug/dL

## 2025-02-05 ENCOUNTER — Telehealth (INDEPENDENT_AMBULATORY_CARE_PROVIDER_SITE_OTHER): Payer: Self-pay | Admitting: Physician Assistant

## 2025-02-05 ENCOUNTER — Other Ambulatory Visit (HOSPITAL_COMMUNITY): Payer: Self-pay

## 2025-02-05 ENCOUNTER — Ambulatory Visit: Payer: Self-pay | Admitting: Internal Medicine

## 2025-02-05 NOTE — Telephone Encounter (Signed)
 I spoke with the patient about her audiological results.  These were reassuring with no significant abnormalities.  I do feel that her ear related complaints are likely secondary to her TMJ which was worsened after the PRP injections.  I have low suspicion for any ear pathology.  Her MRI showed no significant abnormalities other than the pituitary adenoma.  At this point I would recommend following up with oral surgery, she may follow-up with me if she has any further ear related complaints.  She verbalized understanding and agreement to today's plan had no further questions or concerns.

## 2025-02-23 ENCOUNTER — Ambulatory Visit (INDEPENDENT_AMBULATORY_CARE_PROVIDER_SITE_OTHER): Admitting: Nurse Practitioner

## 2025-03-23 ENCOUNTER — Ambulatory Visit (INDEPENDENT_AMBULATORY_CARE_PROVIDER_SITE_OTHER): Admitting: Nurse Practitioner

## 2026-02-02 ENCOUNTER — Ambulatory Visit: Admitting: Internal Medicine
